# Patient Record
Sex: Female | Born: 1935 | Race: White | Hispanic: No | Smoking: Never smoker
Health system: Southern US, Community
[De-identification: ages and names within clinical notes are randomized; demographics above are authoritative.]

## PROBLEM LIST (undated history)

## (undated) DIAGNOSIS — F0390 Unspecified dementia without behavioral disturbance: Secondary | ICD-10-CM

## (undated) DIAGNOSIS — Z947 Corneal transplant status: Secondary | ICD-10-CM

## (undated) DIAGNOSIS — I10 Essential (primary) hypertension: Secondary | ICD-10-CM

## (undated) HISTORY — PX: ABDOMINAL HYSTERECTOMY: SHX81

## (undated) HISTORY — PX: BACK SURGERY: SHX140

## (undated) HISTORY — PX: APPENDECTOMY: SHX54

## (undated) HISTORY — PX: HIP FRACTURE SURGERY: SHX118

---

## 2009-02-18 ENCOUNTER — Ambulatory Visit (HOSPITAL_COMMUNITY): Admission: RE | Admit: 2009-02-18 | Discharge: 2009-02-18 | Payer: Self-pay | Admitting: Ophthalmology

## 2010-07-14 LAB — COMPREHENSIVE METABOLIC PANEL
ALT: 11 U/L (ref 0–35)
AST: 13 U/L (ref 0–37)
Albumin: 2.9 g/dL — ABNORMAL LOW (ref 3.5–5.2)
Alkaline Phosphatase: 61 U/L (ref 39–117)
Chloride: 110 mEq/L (ref 96–112)
Potassium: 4 mEq/L (ref 3.5–5.1)
Sodium: 144 mEq/L (ref 135–145)
Total Bilirubin: 0.6 mg/dL (ref 0.3–1.2)
Total Protein: 6.4 g/dL (ref 6.0–8.3)

## 2010-07-14 LAB — CBC
HCT: 30 % — ABNORMAL LOW (ref 36.0–46.0)
Platelets: 341 10*3/uL (ref 150–400)
RDW: 13.1 % (ref 11.5–15.5)
WBC: 6.6 10*3/uL (ref 4.0–10.5)

## 2010-07-14 LAB — URINE MICROSCOPIC-ADD ON

## 2010-07-14 LAB — URINALYSIS, ROUTINE W REFLEX MICROSCOPIC
Bilirubin Urine: NEGATIVE
Glucose, UA: NEGATIVE mg/dL
Hgb urine dipstick: NEGATIVE
Ketones, ur: NEGATIVE mg/dL
Protein, ur: NEGATIVE mg/dL
Urobilinogen, UA: 0.2 mg/dL (ref 0.0–1.0)

## 2010-12-20 ENCOUNTER — Ambulatory Visit (INDEPENDENT_AMBULATORY_CARE_PROVIDER_SITE_OTHER): Payer: Self-pay | Admitting: Ophthalmology

## 2010-12-27 ENCOUNTER — Ambulatory Visit (INDEPENDENT_AMBULATORY_CARE_PROVIDER_SITE_OTHER): Payer: Medicare Other | Admitting: Ophthalmology

## 2010-12-27 DIAGNOSIS — H35039 Hypertensive retinopathy, unspecified eye: Secondary | ICD-10-CM

## 2010-12-27 DIAGNOSIS — H43819 Vitreous degeneration, unspecified eye: Secondary | ICD-10-CM

## 2010-12-27 DIAGNOSIS — H3530 Unspecified macular degeneration: Secondary | ICD-10-CM

## 2011-01-14 ENCOUNTER — Inpatient Hospital Stay (HOSPITAL_COMMUNITY): Payer: Medicare Other

## 2011-01-14 ENCOUNTER — Inpatient Hospital Stay (HOSPITAL_COMMUNITY)
Admission: EM | Admit: 2011-01-14 | Discharge: 2011-01-15 | DRG: 065 | Disposition: A | Discharge status: Home / Self Care | Payer: Medicare Other | Attending: Family Medicine | Admitting: Family Medicine

## 2011-01-14 ENCOUNTER — Emergency Department (HOSPITAL_COMMUNITY): Payer: Medicare Other

## 2011-01-14 DIAGNOSIS — I10 Essential (primary) hypertension: Secondary | ICD-10-CM | POA: Diagnosis present

## 2011-01-14 DIAGNOSIS — I369 Nonrheumatic tricuspid valve disorder, unspecified: Secondary | ICD-10-CM

## 2011-01-14 DIAGNOSIS — E538 Deficiency of other specified B group vitamins: Secondary | ICD-10-CM | POA: Diagnosis present

## 2011-01-14 DIAGNOSIS — E785 Hyperlipidemia, unspecified: Secondary | ICD-10-CM | POA: Diagnosis present

## 2011-01-14 DIAGNOSIS — Z7982 Long term (current) use of aspirin: Secondary | ICD-10-CM

## 2011-01-14 DIAGNOSIS — Z886 Allergy status to analgesic agent status: Secondary | ICD-10-CM

## 2011-01-14 DIAGNOSIS — R51 Headache: Secondary | ICD-10-CM | POA: Diagnosis present

## 2011-01-14 DIAGNOSIS — Z880 Allergy status to penicillin: Secondary | ICD-10-CM

## 2011-01-14 DIAGNOSIS — I6350 Cerebral infarction due to unspecified occlusion or stenosis of unspecified cerebral artery: Principal | ICD-10-CM | POA: Diagnosis present

## 2011-01-14 DIAGNOSIS — I4891 Unspecified atrial fibrillation: Secondary | ICD-10-CM | POA: Diagnosis present

## 2011-01-14 DIAGNOSIS — F411 Generalized anxiety disorder: Secondary | ICD-10-CM | POA: Diagnosis present

## 2011-01-14 DIAGNOSIS — G459 Transient cerebral ischemic attack, unspecified: Secondary | ICD-10-CM | POA: Diagnosis present

## 2011-01-14 DIAGNOSIS — Z882 Allergy status to sulfonamides status: Secondary | ICD-10-CM

## 2011-01-14 LAB — COMPREHENSIVE METABOLIC PANEL
ALT: 9 U/L (ref 0–35)
AST: 11 U/L (ref 0–37)
Albumin: 2.9 g/dL — ABNORMAL LOW (ref 3.5–5.2)
Alkaline Phosphatase: 62 U/L (ref 39–117)
BUN: 16 mg/dL (ref 6–23)
CO2: 27 mEq/L (ref 19–32)
Calcium: 9 mg/dL (ref 8.4–10.5)
Chloride: 105 mEq/L (ref 96–112)
Creatinine, Ser: 1.01 mg/dL (ref 0.50–1.10)
GFR calc Af Amer: 55 mL/min — ABNORMAL LOW (ref >90)
GFR calc non Af Amer: 48 mL/min — ABNORMAL LOW (ref >90)
Glucose, Bld: 91 mg/dL (ref 70–99)
Potassium: 4.4 mEq/L (ref 3.5–5.1)
Sodium: 139 mEq/L (ref 135–145)
Total Bilirubin: 0.3 mg/dL (ref 0.3–1.2)
Total Protein: 6.1 g/dL (ref 6.0–8.3)

## 2011-01-14 LAB — LIPID PANEL
Cholesterol: 228 mg/dL — ABNORMAL HIGH (ref 0–200)
HDL: 35 mg/dL — ABNORMAL LOW
LDL Cholesterol: 138 mg/dL — ABNORMAL HIGH (ref 0–99)
Total CHOL/HDL Ratio: 6.5 ratio
Triglycerides: 277 mg/dL — ABNORMAL HIGH
VLDL: 55 mg/dL — ABNORMAL HIGH (ref 0–40)

## 2011-01-14 LAB — COMPREHENSIVE METABOLIC PANEL WITH GFR
ALT: 12 U/L (ref 0–35)
AST: 13 U/L (ref 0–37)
Albumin: 3.4 g/dL — ABNORMAL LOW (ref 3.5–5.2)
Alkaline Phosphatase: 78 U/L (ref 39–117)
BUN: 18 mg/dL (ref 6–23)
CO2: 25 meq/L (ref 19–32)
Calcium: 9.3 mg/dL (ref 8.4–10.5)
Chloride: 105 meq/L (ref 96–112)
Creatinine, Ser: 1.02 mg/dL (ref 0.50–1.10)
GFR calc Af Amer: 55 mL/min — ABNORMAL LOW
GFR calc non Af Amer: 47 mL/min — ABNORMAL LOW
Glucose, Bld: 108 mg/dL — ABNORMAL HIGH (ref 70–99)
Potassium: 4.3 meq/L (ref 3.5–5.1)
Sodium: 136 meq/L (ref 135–145)
Total Bilirubin: 0.3 mg/dL (ref 0.3–1.2)
Total Protein: 6.6 g/dL (ref 6.0–8.3)

## 2011-01-14 LAB — CBC
HCT: 34.1 % — ABNORMAL LOW (ref 36.0–46.0)
Hemoglobin: 11.5 g/dL — ABNORMAL LOW (ref 12.0–15.0)
Hemoglobin: 11.1 g/dL — ABNORMAL LOW (ref 12.0–15.0)
MCH: 28.8 pg (ref 26.0–34.0)
MCHC: 32.6 g/dL (ref 30.0–36.0)
MCV: 88.9 fL (ref 78.0–100.0)
MCV: 88.3 fL (ref 78.0–100.0)
Platelets: 189 10*3/uL (ref 150–400)
Platelets: 208 K/uL (ref 150–400)
RBC: 3.98 MIL/uL (ref 3.87–5.11)
RBC: 3.86 MIL/uL — ABNORMAL LOW (ref 3.87–5.11)
RDW: 13 % (ref 11.5–15.5)
WBC: 8.1 10*3/uL (ref 4.0–10.5)
WBC: 6.5 K/uL (ref 4.0–10.5)

## 2011-01-14 LAB — PROTIME-INR
INR: 1 (ref 0.00–1.49)
INR: 1 (ref 0.00–1.49)
Prothrombin Time: 13.4 s (ref 11.6–15.2)
Prothrombin Time: 13.4 s (ref 11.6–15.2)

## 2011-01-14 LAB — URINALYSIS, ROUTINE W REFLEX MICROSCOPIC
Nitrite: NEGATIVE
Protein, ur: NEGATIVE mg/dL
Urobilinogen, UA: 0.2 mg/dL (ref 0.0–1.0)

## 2011-01-14 LAB — CARDIAC PANEL(CRET KIN+CKTOT+MB+TROPI)
CK, MB: 2.2 ng/mL (ref 0.3–4.0)
Relative Index: INVALID (ref 0.0–2.5)
Total CK: 28 U/L (ref 7–177)
Troponin I: <0.3 ng/mL

## 2011-01-14 LAB — HEMOGLOBIN A1C
Hgb A1c MFr Bld: 6 % — ABNORMAL HIGH (ref <5.7)
Mean Plasma Glucose: 126 mg/dL — ABNORMAL HIGH (ref <117)

## 2011-01-14 LAB — TROPONIN I: Troponin I: <0.3 ng/mL

## 2011-01-14 LAB — CK TOTAL AND CKMB (NOT AT ARMC)
CK, MB: 2.3 ng/mL (ref 0.3–4.0)
Relative Index: INVALID (ref 0.0–2.5)
Total CK: 38 U/L (ref 7–177)

## 2011-01-14 LAB — URINE MICROSCOPIC-ADD ON

## 2011-01-14 LAB — DIFFERENTIAL
Eosinophils Absolute: 0.3 10*3/uL (ref 0.0–0.7)
Lymphs Abs: 2.8 10*3/uL (ref 0.7–4.0)
Neutro Abs: 4.3 10*3/uL (ref 1.7–7.7)
Neutrophils Relative %: 53 % (ref 43–77)

## 2011-01-14 LAB — GLUCOSE, CAPILLARY: Glucose-Capillary: 112 mg/dL — ABNORMAL HIGH (ref 70–99)

## 2011-01-14 LAB — APTT: aPTT: 28 s (ref 24–37)

## 2011-01-14 MED ORDER — GADOBENATE DIMEGLUMINE 529 MG/ML IV SOLN
15.0000 mL | Freq: Once | INTRAVENOUS | Status: AC | PRN
  Administered 2011-01-14: 15 mL via INTRAVENOUS

## 2011-01-15 LAB — CBC
Hemoglobin: 10.6 g/dL — ABNORMAL LOW (ref 12.0–15.0)
MCH: 29 pg (ref 26.0–34.0)
Platelets: 197 10*3/uL (ref 150–400)
RBC: 3.65 MIL/uL — ABNORMAL LOW (ref 3.87–5.11)
WBC: 6.7 10*3/uL (ref 4.0–10.5)

## 2011-01-15 LAB — COMPREHENSIVE METABOLIC PANEL
ALT: 9 U/L (ref 0–35)
AST: 12 U/L (ref 0–37)
Alkaline Phosphatase: 60 U/L (ref 39–117)
CO2: 24 mEq/L (ref 19–32)
Calcium: 8.6 mg/dL (ref 8.4–10.5)
GFR calc Af Amer: 62 mL/min — ABNORMAL LOW (ref >90)
GFR calc non Af Amer: 54 mL/min — ABNORMAL LOW (ref >90)
Glucose, Bld: 91 mg/dL (ref 70–99)
Potassium: 4.4 mEq/L (ref 3.5–5.1)
Sodium: 138 mEq/L (ref 135–145)

## 2011-01-18 NOTE — Consult Note (Signed)
NAMEAIMAN, SONN             ACCOUNT NO.:  [id number]  MEDICAL RECORD NO.:  [id number]  LOCATION:  3706                         FACILITY:  [facility]  PHYSICIAN:  Marlan Palau, M.D.  DATE OF BIRTH:  03-12-1919  DATE OF CONSULTATION:  01/14/2011 DATE OF DISCHARGE:                                CONSULTATION   REASON FOR CONSULTATION:  Transient headache, difficulty speaking, right- sided weakness, which is now resolved.  On CT scan, the patient shows hypodensity in the left frontal lobe.  HISTORY OF PRESENT ILLNESS:  This is an 77 year old female with past medical history of TIA, anxiety, migraines, and hypertension.  The patient does see Dr. Maple Hudson of [facility] for her migraine headaches and recently was given a shot of lidocaine in the posterior occipital nerve for her headaches approximately 5 months ago.  The patient apparently went to sleep last night at her normal baseline; however, was awakened by a sudden severe headache at approximately 2:30 in the morning.  She states this was unlike her normal migraine headache that she did not have a scotoma and the intensity was much more severe. She also states that she had generalized weakness at this time and question of possible difficulty speech.  Due to these symptoms, the patient was brought to [facility] where she was brought to the ED.  The patient was initially supposed to be brought to [facility] by ambulance, made a mistake and brought her to [facility].  Majority of her records are at [facility].  The patient's CT scan of her brain did show a hypodensity in the left anterior frontal region suggestive of a focal infarct.  No evidence of acute intracranial hemorrhage or mass lesion.  As stated, there is no CT to compare.  At the present time, the patient still has a mild headache, but all symptoms were resolved.  PAST MEDICAL HISTORY:  As mentioned above.  MEDICATIONS AT HOME:  The patient  is on B12, folic acid, aspirin 81 mg daily, amlodipine, alprazolam, metoprolol, and lisinopril.  While she has been in the hospital, she has been placed on the same medications; however, Crestor has been added secondary to hyperlipidemia.  ALLERGIES: 1. PENICILLIN. 2. CODEINE. 3. SULFA. 4. MORPHINE. 5. MACROBID.  SOCIAL HISTORY:  The patient does not smoke, drink or do illicit drugs. She lives in [place] with her daughter.  REVIEW OF SYSTEMS:  Negative with the exception of headache.  PHYSICAL EXAMINATION:  VITAL SIGNS:  Blood pressure is 105/43, pulse 57, respiration 18, temperature 98.0. NEUROLOGIC:  She is alert and oriented x3, carries out 2-3 steps commands.  Pupils are equal, round, reactive to light and accommodating with exception of her left pupil, which does show some irregularity secondary to AN iridectomy.  She has conjugate extraocular gaze. Extraocular movements are intact.  Visual fields are grossly intact. Face is symmetrical.  Tongue is in the midline.  The patient shows no dysarthria or aphasia.  Facial sensation is full.  Shoulder shrug and head turn are full.  Coordination; finger-to-nose and heel-to-shin are smooth.  Motor is 5/5 throughout.  Deep tendon reflexes are 2+ throughout downgoing toes bilaterally.  The  patient shows no drift in the upper or lower extremities. PULMONARY:  Clear. CARDIOVASCULAR:  S1-S2 is audible.  No murmurs. NECK:  Negative for bruits.  Sensation is full to pinprick and light touch throughout.  LABORATORY DATA:  Sodium is 139, potassium 4.4, chloride 105, CO2 27, BUN 16, creatinine 1.01, glucose 91.  White blood cell count 6.5, hemoglobin is 11.1, hematocrit 34.1, platelets 208.  Triglycerides 277, cholesterol 228, HDL 35, LDL 138.  IMAGING:  CT of head shows a left frontal hypodensity.  A 2-D echo shows EF of 60-65% with no PFO and no thrombus.  Carotid Dopplers are pending.  ASSESSMENT:  This is an 77 year old Caucasian  female with history of transient ischemic attack and migraine headaches who has now been hospitalized today for wakening with a severe headache and she states generalized headaches; however, per notes prior, there is a component of possible right-sided weakness and difficulty with speech.  All of her symptoms about the headaches have now cleared.  CT of head shows a hypodensity in the left frontal lobe.  MRI and MRA of brain is pending. A 2-D echo is normal.  Carotid Dopplers pending.  RECOMMENDATIONS:  At this time, I agree with stroke workup including MRI and MRA of brain, carotid Dopplers, agree with continuing with aspirin at this time and starting Crestor for hyperlipidemia.  The patient would recommend heart-healthy diet when the patient is able to take p.o.'s.     Felicie Morn, PA-C   ______________________________ C. Lesia Sago, M.D.    DS/MEDQ  D:  01/14/2011  T:  01/14/2011  Job:  [id number]  Electronically Signed by Felicie Morn PA-C on 01/17/2011 03:33:19 PM Electronically Signed by Thana Farr M.D. on 01/18/2011 06:23:03 PM

## 2011-01-19 NOTE — Discharge Summary (Signed)
Lisa Freeman, Lisa Freeman             ACCOUNT NO.:  [id number]  MEDICAL RECORD NO.:  [id number]  LOCATION:  3706                         FACILITY:  [facility]  PHYSICIAN:  Tarry Kos, MD       DATE OF BIRTH:  03/08/1919  DATE OF ADMISSION:  01/14/2011 DATE OF DISCHARGE:  01/15/2011                              DISCHARGE SUMMARY   DISCHARGE DIAGNOSES: 1. Cerebrovascular accident/transient ischemic attack with total     resolution of symptoms with no residual effects. 2. Paroxysmal atrial fibrillation, now normal sinus rhythm, will need     further cardiac monitoring as an outpatient for 2-week period with     [facility], will need to be set up by primary care physician. 3. Hypertension, stable. 4. Hyperlipidemia, newly started on statin.  SUMMARY OF HOSPITAL COURSE:  Lisa Freeman is a very pleasant 77 year old highly functioning female, who came in with slurred speech and some questionable right-sided weakness.  She was admitted for concerns of an acute stroke.  MRI/MRA of her brain only showed a chronic small medium- sized vessel ischemia with a remote anterior division left MCA infarct. Her MRI showed a right ICA subsequent stenosis of 50-60% with no left ICA stenosis.  It was recommended by Neurology, who was consulted to place the patient on full-dose aspirin.  Her symptoms totally resolved while she was here.  She passed her swallow evaluation and did not need any home physical therapy at discharge.  She was found to be in paroxysmal AFib.  Neurology did recommend for her primary care physician to obtain cardiac monitoring for approximately 2-week period as an outpatient in order to pickup if she is going in and out of AFib.  She was in AFib that was rate controlled very briefly here but then converted on her own to normal sinus rhythm.  She has been started on a statin here for her high cholesterol levels.  She is going to be discharged home to follow up with primary care  physician in approximately 1 week, again with further recommendations for cardiac monitoring as an outpatient.  PHYSICAL EXAMINATION:  VITAL SIGNS:  She has been afebrile.  Vital signs have been stable. GENERAL:  Alert and oriented x4.  No apparent distress, cooperative and friendly, very highly functioning. HEENT:  Extraocular muscles intact.  Pupils equal, reactive to light. Oropharynx clear.  Mucous membranes moist. NECK:  No JVD.  No carotid bruits. CARDIAC:  Regular rate and rhythm without murmurs, rubs, or gallops. CHEST:  Clear to auscultation bilaterally.  No wheeze, rhonchi, or rales. ABDOMEN:  Soft, nontender, nondistended.  Positive bowel sounds.  No hepatosplenomegaly. EXTREMITIES:  No clubbing, cyanosis, or edema. PSYCHIATRIC:  Normal mood and affect. NEUROLOGIC:  No focal neurologic deficits.  Cranial nerves II through XII grossly intact. SKIN:  No rashes.  She also had a 2-D echo, which showed normal EF of 60-65% with moderate biatrial enlargement, otherwise normal 2-D echo.  She has been placed on full-dose aspirin, previously was taking baby aspirin.  She has also been placed on Crestor.  Again, at followup, Holter monitoring/[facility] monitoring needs to be arranged.  As she continues to go in and out of  AFib, she would likely very highly be a Coumadin candidate to help prevent her further stroke risk.          ______________________________ Tarry Kos, MD     RD/MEDQ  D:  01/15/2011  T:  01/15/2011  Job:  [id number]  Electronically Signed by Tarry Kos MD on 01/19/2011 07:02:20 PM

## 2011-01-22 NOTE — H&P (Signed)
Lisa Freeman, Lisa Freeman             ACCOUNT NO.:  [id number]  MEDICAL RECORD NO.:  [id number]  LOCATION:  [facility]                         FACILITY:  [facility]  PHYSICIAN:  Lisa Freeman, M.D.      DATE OF BIRTH:  06/08/1918  DATE OF ADMISSION:  01/14/2011 DATE OF DISCHARGE:                             HISTORY & PHYSICAL   PRIMARY CARE PHYSICIAN:  The patient is currently unassigned.  PRESENTING COMPLAINT:  Difficulty speaking and headache.  HISTORY OF PRESENT ILLNESS:  The patient is an 77 year old female who came in secondary to sudden onset of headache and inability to talk or swallow.  She went to bed around 9 o'clock doing fine and woke up suddenly around 2:30 with onset of difficulty swallowing, headache, and difficulty with speech.  She has had history of TIA about 5 years ago that was worked up.  The patient also complaining of some right-sided weakness which is now improving, almost back to within normal.  She denied any fever, no nausea, vomiting, or diarrhea.  She had some mild dizziness with the onset.  She came in about outside the window for possible t-PA.  Hence, she is being admitted to medical service.  At this point, she is improving, but worried.  PAST MEDICAL HISTORY:  Significant for TIA over 5 years ago.  Also, hypertension and anxiety disorders.  ALLERGIES:  No known drug allergies.  MEDICATIONS:  Alprazolam, folic acid, lisinopril, and metoprolol.  SOCIAL HISTORY:  She lives in [place] with her daughter who is here with her.  No tobacco, alcohol, or IV drug use.  FAMILY HISTORY:  No significant family history for strokes or cardiovascular disease.  REVIEW OF SYSTEMS:  Just anxious, otherwise all systems reviewed are negative except per HPI.  PHYSICAL EXAMINATION:  VITAL SIGNS:  She is afebrile, Freeman pressure is currently 162/50, pulse 76, respiratory rate of 12, and saturations 100% on room air. GENERAL:  She is awake, alert, and oriented in no acute  distress. HEENT:  PERRL.  EOMI.  No significant pallor.  No jaundice.  No rhinorrhea. NECK:  Supple.  No JVD.  No lymphadenopathy. RESPIRATORY:  She has good air entry bilaterally.  No wheezes.  No rales.  No crackles. CARDIOVASCULAR:  She has S1 and S2.  No murmur. ABDOMEN:  Soft, full, nontender with positive bowel sounds. EXTREMITIES:  No edema, cyanosis, or clubbing. SKIN:  No ulcer. NEUROLOGIC:  Cranial nerves II through XII seem to be intact.  Her power is 5/5 in upper and lower extremities respectively.  She has slight clumsiness on the left, but no frank focal weakness.  LABORATORY DATA:  White count is 8.1, hemoglobin 11.5, and platelet count of 189.  Glucose 112.  PT 13.4 and INR 1.0.  Her chemistry is currently pending.  Head CT without contrast showed low-attenuation change in the left anterior frontal region suggesting focal infarct.  No evidence of acute intracranial hemorrhage or mass lesion, chronic atrophy and small-vessel ischemic change.  ASSESSMENT:  This is an 77 year old female presenting with what appearsto be an acute stroke.  The patient is here outside the window for t-PA. She seems to be gaining her strength some more.  PLAN: 1.  Acute CVA.  Admit the patient to neuro floor.  Neurology consult.     Get MRI/MRA of the brain, fasting lipid panel, and full stroke     workup.  Depending on the patient's response to these measures, we     will consider home versus placement.  At this point, however, we     will proceed with PT, OT among other things. 2. Hypertension.  We will keep the patient permissive hypertensive for     now.  We will resume her Freeman pressure medicines which include the     lisinopril and the metoprolol. 3. Anxiety disorder.  Continue with anti-anxiety medications as much     as possible especially in the setting of her new stroke.  Further     treatment will depend on the patient's response to these initial     measures.     Lisa Freeman, M.D.     Verlin Grills  D:  01/14/2011  T:  01/14/2011  Job:  [id number]  Electronically Signed by Lisa Freeman M.D. on 01/22/2011 03:24:37 PM

## 2011-03-28 ENCOUNTER — Encounter (INDEPENDENT_AMBULATORY_CARE_PROVIDER_SITE_OTHER): Payer: PRIVATE HEALTH INSURANCE | Admitting: Ophthalmology

## 2011-03-29 ENCOUNTER — Ambulatory Visit (INDEPENDENT_AMBULATORY_CARE_PROVIDER_SITE_OTHER): Payer: Medicare Other | Admitting: Ophthalmology

## 2011-03-29 DIAGNOSIS — H43819 Vitreous degeneration, unspecified eye: Secondary | ICD-10-CM

## 2011-03-29 DIAGNOSIS — H35359 Cystoid macular degeneration, unspecified eye: Secondary | ICD-10-CM

## 2011-03-29 DIAGNOSIS — H35039 Hypertensive retinopathy, unspecified eye: Secondary | ICD-10-CM

## 2011-03-29 DIAGNOSIS — I10 Essential (primary) hypertension: Secondary | ICD-10-CM

## 2011-03-29 DIAGNOSIS — H3530 Unspecified macular degeneration: Secondary | ICD-10-CM

## 2011-03-29 DIAGNOSIS — H27129 Anterior dislocation of lens, unspecified eye: Secondary | ICD-10-CM

## 2011-06-27 ENCOUNTER — Encounter (INDEPENDENT_AMBULATORY_CARE_PROVIDER_SITE_OTHER): Payer: PRIVATE HEALTH INSURANCE | Admitting: Ophthalmology

## 2012-10-25 ENCOUNTER — Encounter (HOSPITAL_COMMUNITY)
Admission: EM | Disposition: A | Discharge status: Home / Self Care | Payer: Self-pay | Source: Home / Self Care | Attending: Family Medicine

## 2012-10-25 ENCOUNTER — Inpatient Hospital Stay: Admit: 2012-10-25 | Payer: Self-pay | Admitting: Urology

## 2012-10-25 ENCOUNTER — Encounter (HOSPITAL_COMMUNITY): Payer: Self-pay | Admitting: Emergency Medicine

## 2012-10-25 ENCOUNTER — Encounter (HOSPITAL_COMMUNITY): Payer: Self-pay | Admitting: *Deleted

## 2012-10-25 ENCOUNTER — Inpatient Hospital Stay (HOSPITAL_COMMUNITY)
Admission: EM | Admit: 2012-10-25 | Discharge: 2012-10-28 | DRG: 694 | Disposition: A | Discharge status: Home / Self Care | Payer: Medicare Other | Attending: Family Medicine | Admitting: Family Medicine

## 2012-10-25 ENCOUNTER — Observation Stay (HOSPITAL_COMMUNITY): Payer: Medicare Other | Admitting: *Deleted

## 2012-10-25 DIAGNOSIS — N139 Obstructive and reflux uropathy, unspecified: Secondary | ICD-10-CM | POA: Diagnosis present

## 2012-10-25 DIAGNOSIS — N390 Urinary tract infection, site not specified: Secondary | ICD-10-CM | POA: Diagnosis present

## 2012-10-25 DIAGNOSIS — N201 Calculus of ureter: Principal | ICD-10-CM | POA: Diagnosis present

## 2012-10-25 DIAGNOSIS — A498 Other bacterial infections of unspecified site: Secondary | ICD-10-CM | POA: Diagnosis present

## 2012-10-25 DIAGNOSIS — D72829 Elevated white blood cell count, unspecified: Secondary | ICD-10-CM

## 2012-10-25 DIAGNOSIS — R112 Nausea with vomiting, unspecified: Secondary | ICD-10-CM | POA: Diagnosis present

## 2012-10-25 DIAGNOSIS — Z66 Do not resuscitate: Secondary | ICD-10-CM | POA: Diagnosis present

## 2012-10-25 DIAGNOSIS — N200 Calculus of kidney: Secondary | ICD-10-CM | POA: Diagnosis present

## 2012-10-25 DIAGNOSIS — I10 Essential (primary) hypertension: Secondary | ICD-10-CM | POA: Diagnosis present

## 2012-10-25 DIAGNOSIS — N138 Other obstructive and reflux uropathy: Secondary | ICD-10-CM | POA: Diagnosis present

## 2012-10-25 DIAGNOSIS — R109 Unspecified abdominal pain: Secondary | ICD-10-CM | POA: Diagnosis present

## 2012-10-25 DIAGNOSIS — D649 Anemia, unspecified: Secondary | ICD-10-CM | POA: Diagnosis present

## 2012-10-25 DIAGNOSIS — N1330 Unspecified hydronephrosis: Secondary | ICD-10-CM | POA: Diagnosis present

## 2012-10-25 HISTORY — DX: Essential (primary) hypertension: I10

## 2012-10-25 HISTORY — PX: CYSTOSCOPY WITH RETROGRADE PYELOGRAM, URETEROSCOPY AND STENT PLACEMENT: SHX5789

## 2012-10-25 LAB — CBC WITH DIFFERENTIAL/PLATELET
Basophils Absolute: 0 10*3/uL (ref 0.0–0.1)
Basophils Relative: 0 % (ref 0–1)
Eosinophils Absolute: 0 10*3/uL (ref 0.0–0.7)
HCT: 36.5 % (ref 36.0–46.0)
Hemoglobin: 11.9 g/dL — ABNORMAL LOW (ref 12.0–15.0)
MCH: 29.4 pg (ref 26.0–34.0)
MCHC: 32.6 g/dL (ref 30.0–36.0)
Monocytes Absolute: 1.1 10*3/uL — ABNORMAL HIGH (ref 0.1–1.0)
Monocytes Relative: 6 % (ref 3–12)
Neutro Abs: 15.7 10*3/uL — ABNORMAL HIGH (ref 1.7–7.7)
Neutrophils Relative %: 88 % — ABNORMAL HIGH (ref 43–77)
RDW: 12.3 % (ref 11.5–15.5)

## 2012-10-25 LAB — POCT I-STAT, CHEM 8
BUN: 16 mg/dL (ref 6–23)
Calcium, Ion: 1.14 mmol/L (ref 1.13–1.30)
Chloride: 102 mEq/L (ref 96–112)
Creatinine, Ser: 1.3 mg/dL — ABNORMAL HIGH (ref 0.50–1.10)
Glucose, Bld: 145 mg/dL — ABNORMAL HIGH (ref 70–99)
HCT: 38 % (ref 36.0–46.0)
Hemoglobin: 12.9 g/dL (ref 12.0–15.0)
Potassium: 4.3 mEq/L (ref 3.5–5.1)
Sodium: 134 mEq/L — ABNORMAL LOW (ref 135–145)
TCO2: 21 mmol/L (ref 0–100)

## 2012-10-25 LAB — URINE MICROSCOPIC-ADD ON

## 2012-10-25 LAB — URINALYSIS, ROUTINE W REFLEX MICROSCOPIC
Bilirubin Urine: NEGATIVE
Hgb urine dipstick: NEGATIVE
Nitrite: NEGATIVE
Protein, ur: NEGATIVE mg/dL
Urobilinogen, UA: 0.2 mg/dL (ref 0.0–1.0)

## 2012-10-25 SURGERY — CYSTOURETEROSCOPY, WITH RETROGRADE PYELOGRAM AND STENT INSERTION
Anesthesia: Monitor Anesthesia Care | Site: Ureter | Laterality: Left | Wound class: Clean Contaminated

## 2012-10-25 MED ORDER — SODIUM CHLORIDE 0.9 % IV SOLN
INTRAVENOUS | Status: DC
  Administered 2012-10-26 – 2012-10-27 (×2): via INTRAVENOUS

## 2012-10-25 MED ORDER — FENTANYL CITRATE 0.05 MG/ML IJ SOLN: 25.0000 ug | INTRAMUSCULAR | Status: DC | PRN

## 2012-10-25 MED ORDER — LIDOCAINE HCL 2 % EX GEL
CUTANEOUS | Status: DC | PRN
  Administered 2012-10-25: 1 via URETHRAL

## 2012-10-25 MED ORDER — ONDANSETRON HCL 4 MG PO TABS
4.0000 mg | ORAL_TABLET | Freq: Four times a day (QID) | ORAL | Status: DC | PRN
  Administered 2012-10-26: 4 mg via ORAL
  Filled 2012-10-25: qty 1

## 2012-10-25 MED ORDER — LIDOCAINE HCL 2 % EX GEL
CUTANEOUS | Status: AC
  Filled 2012-10-25: qty 10

## 2012-10-25 MED ORDER — CIPROFLOXACIN IN D5W 200 MG/100ML IV SOLN
200.0000 mg | Freq: Two times a day (BID) | INTRAVENOUS | Status: DC
  Administered 2012-10-26 – 2012-10-28 (×5): 200 mg via INTRAVENOUS
  Filled 2012-10-25 (×6): qty 100

## 2012-10-25 MED ORDER — IOHEXOL 300 MG/ML  SOLN
INTRAMUSCULAR | Status: DC | PRN
  Administered 2012-10-25: 7 mL

## 2012-10-25 MED ORDER — FOLIC ACID 1 MG PO TABS
1.0000 mg | ORAL_TABLET | Freq: Every day | ORAL | Status: DC
  Administered 2012-10-26 – 2012-10-28 (×3): 1 mg via ORAL
  Filled 2012-10-25 (×3): qty 1

## 2012-10-25 MED ORDER — LISINOPRIL 10 MG PO TABS
10.0000 mg | ORAL_TABLET | Freq: Every day | ORAL | Status: DC
  Administered 2012-10-27 – 2012-10-28 (×2): 10 mg via ORAL
  Filled 2012-10-25 (×3): qty 1

## 2012-10-25 MED ORDER — PROMETHAZINE HCL 25 MG/ML IJ SOLN: 6.2500 mg | INTRAMUSCULAR | Status: DC | PRN

## 2012-10-25 MED ORDER — HYDROCODONE-ACETAMINOPHEN 5-325 MG PO TABS
1.0000 | ORAL_TABLET | Freq: Once | ORAL | Status: AC
  Administered 2012-10-25: 1 via ORAL
  Filled 2012-10-25: qty 1

## 2012-10-25 MED ORDER — IOHEXOL 300 MG/ML  SOLN
INTRAMUSCULAR | Status: AC
  Filled 2012-10-25: qty 1

## 2012-10-25 MED ORDER — 0.9 % SODIUM CHLORIDE (POUR BTL) OPTIME
TOPICAL | Status: DC | PRN
  Administered 2012-10-25: 1000 mL

## 2012-10-25 MED ORDER — SULFAMETHOXAZOLE-TRIMETHOPRIM 800-160 MG PO TABS: 1.0000 | ORAL_TABLET | Freq: Two times a day (BID) | ORAL | Status: DC

## 2012-10-25 MED ORDER — SODIUM CHLORIDE 0.9 % IV SOLN
INTRAVENOUS | Status: DC | PRN
  Administered 2012-10-25: 22:00:00 via INTRAVENOUS

## 2012-10-25 MED ORDER — ONDANSETRON HCL 4 MG/2ML IJ SOLN
4.0000 mg | Freq: Once | INTRAMUSCULAR | Status: AC
  Administered 2012-10-25: 4 mg via INTRAVENOUS
  Filled 2012-10-25: qty 2

## 2012-10-25 MED ORDER — STERILE WATER FOR IRRIGATION IR SOLN
Status: DC | PRN
  Administered 2012-10-25: 3000 mL

## 2012-10-25 MED ORDER — KETAMINE HCL 10 MG/ML IJ SOLN
INTRAMUSCULAR | Status: DC | PRN
  Administered 2012-10-25: 20 mg via INTRAVENOUS

## 2012-10-25 MED ORDER — ACETAMINOPHEN 325 MG PO TABS
650.0000 mg | ORAL_TABLET | Freq: Four times a day (QID) | ORAL | Status: DC | PRN
  Administered 2012-10-26 (×2): 650 mg via ORAL
  Filled 2012-10-25 (×2): qty 2

## 2012-10-25 MED ORDER — SODIUM CHLORIDE 0.9 % IJ SOLN
3.0000 mL | Freq: Two times a day (BID) | INTRAMUSCULAR | Status: DC
  Administered 2012-10-26: 3 mL via INTRAVENOUS

## 2012-10-25 MED ORDER — ONDANSETRON HCL 4 MG/2ML IJ SOLN: 4.0000 mg | Freq: Four times a day (QID) | INTRAMUSCULAR | Status: DC | PRN

## 2012-10-25 MED ORDER — PHENYLEPHRINE HCL 10 MG/ML IJ SOLN
INTRAMUSCULAR | Status: DC | PRN
  Administered 2012-10-25: 40 ug via INTRAVENOUS

## 2012-10-25 MED ORDER — MORPHINE SULFATE 2 MG/ML IJ SOLN
1.0000 mg | INTRAMUSCULAR | Status: DC | PRN
  Administered 2012-10-27: 1 mg via INTRAVENOUS
  Filled 2012-10-25: qty 1

## 2012-10-25 MED ORDER — LACTATED RINGERS IV SOLN: INTRAVENOUS | Status: DC

## 2012-10-25 MED ORDER — CIPROFLOXACIN IN D5W 400 MG/200ML IV SOLN
400.0000 mg | Freq: Once | INTRAVENOUS | Status: AC
  Administered 2012-10-25: 400 mg via INTRAVENOUS
  Filled 2012-10-25: qty 200

## 2012-10-25 MED ORDER — MORPHINE SULFATE 4 MG/ML IJ SOLN
4.0000 mg | Freq: Once | INTRAMUSCULAR | Status: AC
  Administered 2012-10-25: 4 mg via INTRAVENOUS
  Filled 2012-10-25: qty 1

## 2012-10-25 MED ORDER — HYDROCODONE-ACETAMINOPHEN 5-325 MG PO TABS: 1.0000 | ORAL_TABLET | ORAL | Status: DC | PRN

## 2012-10-25 MED ORDER — ALPRAZOLAM 0.25 MG PO TABS
0.2500 mg | ORAL_TABLET | Freq: Every day | ORAL | Status: DC
  Administered 2012-10-26 – 2012-10-27 (×3): 0.25 mg via ORAL
  Filled 2012-10-25 (×3): qty 1

## 2012-10-25 MED ORDER — METOPROLOL TARTRATE 12.5 MG HALF TABLET
12.5000 mg | ORAL_TABLET | Freq: Two times a day (BID) | ORAL | Status: DC
  Administered 2012-10-26 – 2012-10-28 (×5): 12.5 mg via ORAL
  Filled 2012-10-25 (×8): qty 1

## 2012-10-25 MED ORDER — ACETAMINOPHEN 650 MG RE SUPP: 650.0000 mg | Freq: Four times a day (QID) | RECTAL | Status: DC | PRN

## 2012-10-25 MED ORDER — ONDANSETRON HCL 4 MG/2ML IJ SOLN
4.0000 mg | Freq: Once | INTRAMUSCULAR | Status: AC
Start: 2012-10-25 — End: 2012-10-25
  Administered 2012-10-25: 4 mg via INTRAVENOUS
  Filled 2012-10-25: qty 2

## 2012-10-25 MED ORDER — PROPOFOL INFUSION 10 MG/ML OPTIME
INTRAVENOUS | Status: DC | PRN
  Administered 2012-10-25: 140 ug/kg/min via INTRAVENOUS

## 2012-10-25 MED ORDER — MORPHINE SULFATE 4 MG/ML IJ SOLN: 4.0000 mg | Freq: Once | INTRAMUSCULAR | Status: DC

## 2012-10-25 MED ORDER — AMLODIPINE BESYLATE 5 MG PO TABS
5.0000 mg | ORAL_TABLET | Freq: Every day | ORAL | Status: DC
  Administered 2012-10-27 – 2012-10-28 (×2): 5 mg via ORAL
  Filled 2012-10-25 (×3): qty 1

## 2012-10-25 SURGICAL SUPPLY — 20 items
ADAPTER CATH URET PLST 4-6FR (CATHETERS) ×2 IMPLANT
BAG URO CATCHER STRL LF (DRAPE) ×2 IMPLANT
CATH INTERMIT  6FR 70CM (CATHETERS) IMPLANT
CATH URET 5FR 28IN CONE TIP (BALLOONS) ×1
CATH URET 5FR 70CM CONE TIP (BALLOONS) ×1 IMPLANT
CLOTH BEACON ORANGE TIMEOUT ST (SAFETY) ×2 IMPLANT
DRAPE CAMERA CLOSED 9X96 (DRAPES) ×2 IMPLANT
GLOVE BIOGEL M 7.0 STRL (GLOVE) ×2 IMPLANT
GLOVE SURG SS PI 8.5 STRL IVOR (GLOVE) ×1
GLOVE SURG SS PI 8.5 STRL STRW (GLOVE) ×1 IMPLANT
GOWN STRL NON-REIN LRG LVL3 (GOWN DISPOSABLE) ×2 IMPLANT
GOWN STRL REIN 2XL XLG LVL4 (GOWN DISPOSABLE) ×2 IMPLANT
GUIDEWIRE STR DUAL SENSOR (WIRE) ×2 IMPLANT
MANIFOLD NEPTUNE II (INSTRUMENTS) ×2 IMPLANT
NS IRRIG 1000ML POUR BTL (IV SOLUTION) ×2 IMPLANT
PACK CYSTO (CUSTOM PROCEDURE TRAY) ×2 IMPLANT
STENT CONTOUR 6FRX24X.038 (STENTS) ×2 IMPLANT
TUBING CONNECTING 10 (TUBING) ×2 IMPLANT
WATER STERILE IRR 3000ML UROMA (IV SOLUTION) ×2 IMPLANT
WIRE COONS/BENSON .038X145CM (WIRE) IMPLANT

## 2012-10-25 NOTE — Preoperative (Signed)
Beta Blockers   Reason not to administer Beta Blockers:Not Applicable 

## 2012-10-25 NOTE — Anesthesia Preprocedure Evaluation (Signed)
Anesthesia Evaluation  Patient identified by MRN, date of birth, ID band Patient awake    Reviewed: Allergy & Precautions, H&P , NPO status , Patient's Chart, lab work & pertinent test results  Airway Mallampati: II TM Distance: >3 FB Neck ROM: Full    Dental  (+) Edentulous Upper and Edentulous Lower   Pulmonary neg pulmonary ROS,  breath sounds clear to auscultation  Pulmonary exam normal       Cardiovascular hypertension, Rhythm:Regular Rate:Normal     Neuro/Psych negative neurological ROS  negative psych ROS   GI/Hepatic negative GI ROS, Neg liver ROS,   Endo/Other  negative endocrine ROS  Renal/GU negative Renal ROS  negative genitourinary   Musculoskeletal negative musculoskeletal ROS (+)   Abdominal   Peds  Hematology negative hematology ROS (+)   Anesthesia Other Findings   Reproductive/Obstetrics                           Anesthesia Physical Anesthesia Plan  ASA: II and emergent  Anesthesia Plan: MAC   Post-op Pain Management:    Induction:   Airway Management Planned: Simple Face Mask  Additional Equipment:   Intra-op Plan:   Post-operative Plan:   Informed Consent: I have reviewed the patients History and Physical, chart, labs and discussed the procedure including the risks, benefits and alternatives for the proposed anesthesia with the patient or authorized representative who has indicated his/her understanding and acceptance.   Dental advisory given  Plan Discussed with: CRNA  Anesthesia Plan Comments:         Anesthesia Quick Evaluation

## 2012-10-25 NOTE — H&P (Signed)
PCP:   CRAFT,PAUL E., PA-C   Chief Complaint:  abd pain  HPI: 79 yo female with one day h/o left flank and llq abd pain.  Feels awful.  No n/v.  No fevers but has spiked a temp in ED.  Was seen by pcp today and had ct done as outpt which showed an obstructed infected left ureteral stone.  Pt is very healthy and lives with her daughter who is present in ED.  Pt pain initially was 20/10 now is 7/10.  She is about to go to the OR with urology for stone removal and possible stent.    Review of Systems:  Positive and negative as per HPI otherwise all other systems are negative  Past Medical History: Past Medical History  Diagnosis Date  . Hypertension    Past Surgical History  Procedure Laterality Date  . Appendectomy    . Back surgery    . Abdominal hysterectomy      Medications: Prior to Admission medications   Medication Sig Start Date End Date Taking? Authorizing Provider  ALPRAZolam (XANAX) 0.25 MG tablet Take 0.25 mg by mouth at bedtime.   Yes Historical Provider, MD  amLODipine (NORVASC) 5 MG tablet Take 5 mg by mouth daily.   Yes Historical Provider, MD  aspirin EC 81 MG tablet Take 81 mg by mouth daily.   Yes Historical Provider, MD  folic acid (FOLVITE) 1 MG tablet Take 1 mg by mouth daily.   Yes Historical Provider, MD  lisinopril (PRINIVIL,ZESTRIL) 10 MG tablet Take 10 mg by mouth daily.   Yes Historical Provider, MD  metoprolol tartrate (LOPRESSOR) 25 MG tablet Take 12.5 mg by mouth 2 (two) times daily. Take 1/2 tablet (12.5mg ) twice daily   Yes Historical Provider, MD  traMADol (ULTRAM) 50 MG tablet Take 50 mg by mouth every 6 (six) hours as needed for pain.   Yes Historical Provider, MD  HYDROcodone-acetaminophen (NORCO/VICODIN) 5-325 MG per tablet Take 1 tablet by mouth every 4 (four) hours as needed for pain. 10/25/12   Juliet Rude. Pickering, MD  sulfamethoxazole-trimethoprim (BACTRIM DS,SEPTRA DS) 800-160 MG per tablet Take 1 tablet by mouth 2 (two) times daily. 10/25/12    Juliet Rude. Rubin Payor, MD    Allergies:   Allergies  Allergen Reactions  . Penicillins Shortness Of Breath  . Codeine Nausea And Vomiting    Social History:  reports that she has never smoked. She does not have any smokeless tobacco history on file. She reports that she does not drink alcohol or use illicit drugs.  Family History: History reviewed. No pertinent family history.  Physical Exam: Filed Vitals:   10/25/12 1630 10/25/12 2023 10/25/12 2045 10/25/12 2047  BP: 139/58 177/89 141/72   Pulse: 64 124 124   Temp:  101.7 F (38.7 C) 100.3 F (37.9 C)   TempSrc:  Oral Oral   Resp:  20 18   SpO2: 96% 96% 89% 95%   General appearance: alert, cooperative and no distress Head: Normocephalic, without obvious abnormality, atraumatic Eyes: negative Nose: Nares normal. Septum midline. Mucosa normal. No drainage or sinus tenderness. Neck: no JVD and supple, symmetrical, trachea midline Lungs: clear to auscultation bilaterally Heart: regular rate and rhythm, S1, S2 normal, no murmur, click, rub or gallop Abdomen: soft ttp left flank nd pos bs no r/g nonacute abd Extremities: extremities normal, atraumatic, no cyanosis or edema Pulses: 2+ and symmetric Skin: Skin color, texture, turgor normal. No rashes or lesions Neurologic: Grossly normal    Labs  on Admission:   Recent Labs  10/25/12 1721  NA 134*  K 4.3  CL 102  GLUCOSE 145*  BUN 16  CREATININE 1.30*    Recent Labs  10/25/12 1710 10/25/12 1721  WBC 17.8*  --   NEUTROABS 15.7*  --   HGB 11.9* 12.9  HCT 36.5 38.0  MCV 90.1  --   PLT 257  --    Radiological Exams on Admission: No results found.  Assessment/Plan  79 yo female with obstructive infected left ureteral stone Principal Problem:   Hypertension Active Problems:   Urinary tract obstruction due to kidney stone  Agree with OR tonight.  Place on ciprofloxacin.  Blood cultures pending.  Pt should do well once stone removed hopefully.  She  wishes to be DNR, does not ever want cpr or intubation in the future except for this surgery if needed.  Place on tele after surgery if everything goes well in OR tonight.    DAVID,RACHAL A 10/25/2012, 9:57 PM

## 2012-10-25 NOTE — Transfer of Care (Signed)
Immediate Anesthesia Transfer of Care Note  Patient: Leodis Binet  Procedure(s) Performed: Procedure(s) with comments: CYSTOSCOPY/LEFT RETROGRADE PYELOGRAM/PLACEMENT LEFT URETERAL STENT (Left) - CYSTOSCOPY/LEFT RETROGRADE PYELOGRAM/URETEROSCOPY/PLACEMENT URETERAL STENT  Patient Location: PACU  Anesthesia Type:MAC  Level of Consciousness: awake, alert  and oriented  Airway & Oxygen Therapy: Patient Spontanous Breathing and Patient connected to face mask oxygen  Post-op Assessment: Report given to PACU RN and Post -op Vital signs reviewed and stable  Post vital signs: Reviewed and stable  Complications: No apparent anesthesia complications

## 2012-10-25 NOTE — ED Provider Notes (Addendum)
History    CSN: [id number] Arrival date & time 10/25/12  1619  First MD Initiated Contact with Patient 10/25/12 1625     Chief Complaint  Patient presents with  . Flank Pain   (Consider location/radiation/quality/duration/timing/severity/associated sxs/prior Treatment) Patient is a 79 y.o. female presenting with flank pain. The history is provided by the patient.  Flank Pain This is a new problem. Pertinent negatives include no chest pain, no abdominal pain, no headaches and no shortness of breath.   patient presents with left abdominal/flank pain. She got seen at [facility] by her PCP and sent for a CT. She was sent in with a kidney stone. No recent kidney stones. Past Medical History  Diagnosis Date  . Hypertension    Past Surgical History  Procedure Laterality Date  . Appendectomy    . Back surgery    . Abdominal hysterectomy     History reviewed. No pertinent family history. History  Substance Use Topics  . Smoking status: Never Smoker   . Smokeless tobacco: Not on file  . Alcohol Use: No   OB History   Grav Para Term Preterm Abortions TAB SAB Ect Mult Living                 Review of Systems  Constitutional: Negative for activity change and appetite change.  HENT: Negative for neck stiffness.   Eyes: Negative for pain.  Respiratory: Negative for chest tightness and shortness of breath.   Cardiovascular: Negative for chest pain and leg swelling.  Gastrointestinal: Positive for nausea. Negative for vomiting, abdominal pain and diarrhea.  Genitourinary: Positive for flank pain.  Musculoskeletal: Negative for back pain.  Skin: Negative for rash.  Neurological: Negative for weakness, numbness and headaches.  Psychiatric/Behavioral: Negative for behavioral problems.    Allergies  Penicillins and Codeine  Home Medications   Current Outpatient Rx  Name  Route  Sig  Dispense  Refill  . ALPRAZolam (XANAX) 0.25 MG tablet   Oral   Take 0.25 mg by mouth at  bedtime.         Marland Kitchen amLODipine (NORVASC) 5 MG tablet   Oral   Take 5 mg by mouth daily.         Marland Kitchen aspirin EC 81 MG tablet   Oral   Take 81 mg by mouth daily.         . folic acid (FOLVITE) 1 MG tablet   Oral   Take 1 mg by mouth daily.         Marland Kitchen lisinopril (PRINIVIL,ZESTRIL) 10 MG tablet   Oral   Take 10 mg by mouth daily.         . metoprolol tartrate (LOPRESSOR) 25 MG tablet   Oral   Take 12.5 mg by mouth 2 (two) times daily. Take 1/2 tablet (12.5mg ) twice daily         . traMADol (ULTRAM) 50 MG tablet   Oral   Take 50 mg by mouth every 6 (six) hours as needed for pain.         Marland Kitchen HYDROcodone-acetaminophen (NORCO/VICODIN) 5-325 MG per tablet   Oral   Take 1 tablet by mouth every 4 (four) hours as needed for pain.   10 tablet   0   . sulfamethoxazole-trimethoprim (BACTRIM DS,SEPTRA DS) 800-160 MG per tablet   Oral   Take 1 tablet by mouth 2 (two) times daily.   6 tablet   0    BP 139/58  Pulse 64  Temp(Src)  97.9 F (36.6 C) (Oral)  Resp 16  SpO2 96% Physical Exam  Nursing note and vitals reviewed. Constitutional: She is oriented to person, place, and time. She appears well-developed and well-nourished.  HENT:  Head: Normocephalic and atraumatic.  Eyes: EOM are normal. Pupils are equal, round, and reactive to light.  Neck: Normal range of motion. Neck supple.  Cardiovascular: Normal rate, regular rhythm and normal heart sounds.   No murmur heard. Pulmonary/Chest: Effort normal and breath sounds normal. No respiratory distress. She has no wheezes. She has no rales.  Abdominal: Soft. Bowel sounds are normal. She exhibits no distension. There is tenderness. There is no rebound and no guarding.  Left lower abdominal tenderness. No CVA tenderness. Patient appears uncomfortable.  Musculoskeletal: Normal range of motion.  Neurological: She is alert and oriented to person, place, and time. No cranial nerve deficit.  Skin: Skin is warm and dry.   Psychiatric: She has a normal mood and affect. Her speech is normal.    ED Course  Procedures (including critical care time) Labs Reviewed  CBC WITH DIFFERENTIAL - Abnormal; Notable for the following:    WBC 17.8 (*)    Hemoglobin 11.9 (*)    Neutrophils Relative % 88 (*)    Neutro Abs 15.7 (*)    Lymphocytes Relative 6 (*)    Monocytes Absolute 1.1 (*)    All other components within normal limits  URINALYSIS, ROUTINE W REFLEX MICROSCOPIC - Abnormal; Notable for the following:    APPearance CLOUDY (*)    Leukocytes, UA MODERATE (*)    All other components within normal limits  POCT I-STAT, CHEM 8 - Abnormal; Notable for the following:    Sodium 134 (*)    Creatinine, Ser 1.30 (*)    Glucose, Bld 145 (*)    All other components within normal limits  URINE MICROSCOPIC-ADD ON   No results found. 1. Ureteral stone   2. Leukocytosis     MDM  Patient with left flank and abdominal pain. CT shows ureteral stone. White count is elevated without clear infection. Patient has a minimally elevated creatinine. CT reviewed by Dr. Brunilda Payor. Pain control the patient will followup  Juliet Rude. Rubin Payor, MD 10/25/12 2002  On discharge vitals patient developed a fever of 101.7 and tachycardia. Will rediscuss with Dr. Brunilda Payor and may require admission.  Patient is to be admitted to medicine with Dr. Brunilda Payor putting a stent in tonight.  Juliet Rude. Rubin Payor, MD 10/25/12 2154

## 2012-10-25 NOTE — Op Note (Signed)
Stephanie Littman is a 79 y.o.   10/25/2012  General  Preop diagnosis: Urosepsis, left hydronephrosis, left ureteral stone  Postop diagnosis: Same  Procedure done: Cystoscopy, left retrograde pyelogram, insertion of double-J stent  Surgeon: Wendie Simmer. Halayna Blane  Anesthesia: Monitor anesthesia care  Indication: Patient is a 79 years old female who was seen in the emergency room tonight with a history of sudden onset of severe left flank pain this morning associated with nausea and vomiting. She was seen by her primary care physician who requested a CT scan that showed left perinephric stranding, moderate left hydronephrosis and a left distal ureteral calculus. She spiked a temperature to 101.7 in the emergency room. She needs a stent placement to decompress the left kidney.  Procedure: Patient was identified by her wrist band and proper timeout was taken.  Under monitored anesthesia care she was prepped and draped and placed in the dorsolithotomy position. A panendoscope was inserted in the bladder. The bladder mucosa is normal. There is no stone or tumor in the bladder. The ureteral orifices are in normal position and shape.  Left retrograde pyelogram:  A cone-tip catheter was passed through the cystoscope and the left ureteral orifice. 7 cc of contrast were then injected through the cone-tip catheter. The distal ureter is normal. There is a stone in the mid ureter. The proximal ureter appears moderately dilated. The cone-tip catheter was then removed. A sensor wire was passed through the cystoscope and the left ureter.  A #[id number] French-[id number] double-J stent was passed over the sensor wire. The proximal curl of the double-J stent is in the renal pelvis. The distal curl is in the bladder. The bladder was then emptied and the cystoscope and sensor wire were removed.  The patient tolerated the procedure well and left the OR in satisfactory condition to postanesthesia care unit

## 2012-10-25 NOTE — ED Notes (Signed)
Onset left flank pain this morning. Pt went to [facility], Dr told pt it was kidney stone and sent pt to ED.

## 2012-10-25 NOTE — Consult Note (Signed)
Urology Consult  Referring physician: Dr Benjiman Core Reason for referral: Left flank pan, fever  Chief Complaint: Left flank pain  History of Present Illness: The patient is a 79 years old female who saw her PCP today with sudden onset of severe left flank pain associated with nausea and vomiting.  CT scan showed left perinephric stranding, moderate left hydronephrosis and a stone in the left distal ureter.  She was given IV analgesics and felt better after a while.  Then she spiked a temperature to 101.7. I was asked to see her in consultation.  Past Medical History  Diagnosis Date  . Hypertension    Past Surgical History  Procedure Laterality Date  . Appendectomy    . Back surgery    . Abdominal hysterectomy      Medications: Xanax, Norvasc, Aspirin, folic acid, lisinopril, metoprolol, tramadol Allergies:  Allergies  Allergen Reactions  . Penicillins Shortness Of Breath  . Codeine Nausea And Vomiting    History reviewed. No pertinent family history. Social History:  reports that she has never smoked. She does not have any smokeless tobacco history on file. She reports that she does not drink alcohol or use illicit drugs.  ROS: All systems are reviewed and negative except as noted.   Physical Exam:  Vital signs in last 24 hours: Temp:  [97.9 F (36.6 C)-101.7 F (38.7 C)] 100.3 F (37.9 C) (07/17 2045) Pulse Rate:  [64-124] 124 (07/17 2045) Resp:  [16-20] 18 (07/17 2045) BP: (139-177)/(58-89) 141/72 mmHg (07/17 2045) SpO2:  [89 %-97 %] 95 % (07/17 2047) HEENT:  Normal.  Neck: Supple.  No cervical adenopathy.  No thyromegaly. Cardiovascular: Skin warm; not flushed Respiratory: Breaths quiet; no shortness of breath Abdomen: No masses Neurological: Normal sensation to touch Musculoskeletal: Normal motor function arms and legs Lymphatics: No inguinal adenopathy Skin: No rashes Genitourinary:Normal female genitalia.  Uterus absent.  Laboratory Data:  Results  for orders placed during the hospital encounter of 10/25/12 (from the past 72 hour(s))  URINALYSIS, ROUTINE W REFLEX MICROSCOPIC     Status: Abnormal   Collection Time    10/25/12  4:46 PM      Result Value Range   Color, Urine YELLOW  YELLOW   APPearance CLOUDY (*) CLEAR   Specific Gravity, Urine 1.018  1.005 - 1.030   pH 7.5  5.0 - 8.0   Glucose, UA NEGATIVE  NEGATIVE mg/dL   Hgb urine dipstick NEGATIVE  NEGATIVE   Bilirubin Urine NEGATIVE  NEGATIVE   Ketones, ur NEGATIVE  NEGATIVE mg/dL   Protein, ur NEGATIVE  NEGATIVE mg/dL   Urobilinogen, UA 0.2  0.0 - 1.0 mg/dL   Nitrite NEGATIVE  NEGATIVE   Leukocytes, UA MODERATE (*) NEGATIVE  URINE MICROSCOPIC-ADD ON     Status: None   Collection Time    10/25/12  4:46 PM      Result Value Range   Squamous Epithelial / LPF RARE  RARE   WBC, UA 7-10  <3 WBC/hpf  CBC WITH DIFFERENTIAL     Status: Abnormal   Collection Time    10/25/12  5:10 PM      Result Value Range   WBC 17.8 (*) 4.0 - 10.5 K/uL   RBC 4.05  3.87 - 5.11 MIL/uL   Hemoglobin 11.9 (*) 12.0 - 15.0 g/dL   HCT 09.8  11.9 - 14.7 %   MCV 90.1  78.0 - 100.0 fL   MCH 29.4  26.0 - 34.0 pg   MCHC 32.6  30.0 - 36.0 g/dL   RDW 91.4  78.2 - 95.6 %   Platelets 257  150 - 400 K/uL   Neutrophils Relative % 88 (*) 43 - 77 %   Neutro Abs 15.7 (*) 1.7 - 7.7 K/uL   Lymphocytes Relative 6 (*) 12 - 46 %   Lymphs Abs 1.0  0.7 - 4.0 K/uL   Monocytes Relative 6  3 - 12 %   Monocytes Absolute 1.1 (*) 0.1 - 1.0 K/uL   Eosinophils Relative 0  0 - 5 %   Eosinophils Absolute 0.0  0.0 - 0.7 K/uL   Basophils Relative 0  0 - 1 %   Basophils Absolute 0.0  0.0 - 0.1 K/uL  POCT I-STAT, CHEM 8     Status: Abnormal   Collection Time    10/25/12  5:21 PM      Result Value Range   Sodium 134 (*) 135 - 145 mEq/L   Potassium 4.3  3.5 - 5.1 mEq/L   Chloride 102  96 - 112 mEq/L   BUN 16  6 - 23 mg/dL   Creatinine, Ser 2.13 (*) 0.50 - 1.10 mg/dL   Glucose, Bld 086 (*) 70 - 99 mg/dL   Calcium, Ion  5.78  4.69 - 1.30 mmol/L   TCO2 21  0 - 100 mmol/L   Hemoglobin 12.9  12.0 - 15.0 g/dL   HCT 62.9  52.8 - 41.3 %   No results found for this or any previous visit (from the past 240 hour(s)). Creatinine:  Recent Labs  10/25/12 1721  CREATININE 1.30*    Xrays: CT scan shows perinephric stranding a calculus in the left distal ureter.  Impression/Assessment:  Left distal ureteral calculus, moderate hydronephrosis.  Urosepsis.  Plan: Because of her pain and fever I believe she would benefit from stent placement.  Cystoscopy, left retrograde pyelogram, JJ stent placement  Shawntae Lowy-HENRY 10/25/2012, 9:34 PM   CC: Dr Benjiman Core

## 2012-10-25 NOTE — ED Notes (Signed)
ZOX:[id number]<AV> Expected date:<BR> Expected time:<BR> Means of arrival:<BR> Comments:<BR> Hold for [place], possible kidney stone

## 2012-10-26 ENCOUNTER — Encounter (HOSPITAL_COMMUNITY): Payer: Self-pay | Admitting: Urology

## 2012-10-26 LAB — CBC
MCH: 30.5 pg (ref 26.0–34.0)
MCV: 91.7 fL (ref 78.0–100.0)
Platelets: 191 10*3/uL (ref 150–400)
RBC: 3.15 MIL/uL — ABNORMAL LOW (ref 3.87–5.11)

## 2012-10-26 LAB — BASIC METABOLIC PANEL
BUN: 18 mg/dL (ref 6–23)
CO2: 20 mEq/L (ref 19–32)
Calcium: 8.2 mg/dL — ABNORMAL LOW (ref 8.4–10.5)
Creatinine, Ser: 1.4 mg/dL — ABNORMAL HIGH (ref 0.50–1.10)
Glucose, Bld: 145 mg/dL — ABNORMAL HIGH (ref 70–99)
Sodium: 131 mEq/L — ABNORMAL LOW (ref 135–145)

## 2012-10-26 MED ORDER — OXYBUTYNIN CHLORIDE 5 MG PO TABS
5.0000 mg | ORAL_TABLET | Freq: Three times a day (TID) | ORAL | Status: DC
  Filled 2012-10-26 (×2): qty 1

## 2012-10-26 MED ORDER — OXYBUTYNIN CHLORIDE 5 MG PO TABS
5.0000 mg | ORAL_TABLET | Freq: Three times a day (TID) | ORAL | Status: DC | PRN
  Filled 2012-10-26 (×2): qty 1

## 2012-10-26 MED ORDER — BISACODYL 10 MG RE SUPP
10.0000 mg | Freq: Every day | RECTAL | Status: DC | PRN
  Filled 2012-10-26: qty 1

## 2012-10-26 NOTE — Anesthesia Postprocedure Evaluation (Signed)
Anesthesia Post Note  Patient: Lisa Freeman  Procedure(s) Performed: Procedure(s) (LRB): CYSTOSCOPY/LEFT RETROGRADE PYELOGRAM/PLACEMENT LEFT URETERAL STENT (Left)  Anesthesia type: General  Patient location: PACU  Post pain: Pain level controlled  Post assessment: Post-op Vital signs reviewed  Last Vitals:  Filed Vitals:   10/25/12 2330  BP: 114/60  Pulse: 80  Temp: 36.9 C  Resp: 20    Post vital signs: Reviewed  Level of consciousness: sedated  Complications: No apparent anesthesia complications

## 2012-10-26 NOTE — Care Management Note (Addendum)
    Page 1 of 1   10/28/2012     4:57:59 PM   CARE MANAGEMENT NOTE 10/28/2012  Patient:  [facility]   Account Number:  [id number]  Date Initiated:  10/26/2012  Documentation initiated by:  Lanier Clam  Subjective/Objective Assessment:   ADMITTED W/UTI.L URETERAL STONE.     Action/Plan:   FROM HOME W/DAUGHTER.HAS PCP,PHARMACY.HAS CANE,RW.   Anticipated DC Date:  10/28/2012   Anticipated DC Plan:  HOME/SELF CARE      DC Planning Services  CM consult      Choice offered to / List presented to:             Status of service:  Completed, signed off Medicare Important Message given?   (If response is "NO", the following Medicare IM given date fields will be blank) Date Medicare IM given:   Date Additional Medicare IM given:    Discharge Disposition:  HOME/SELF CARE  Per UR Regulation:  Reviewed for med. necessity/level of care/duration of stay  If discussed at Long Length of Stay Meetings, dates discussed:    Comments:  10/28/12 Carrell Palmatier RN,BSN NCM [phone] D/C HOME NO ORDERS OR NEEDS.  10/26/12 Jaisen Wiltrout RN,BSN NCM [phone] POD#1 DOUBLE J STENT,CLEARS,IV ABX.

## 2012-10-26 NOTE — Progress Notes (Signed)
[facility] PROGRESS NOTE  Lisa Freeman ZOX:[id number] DOB: 05-09-1918 DOA: 10/25/2012 PCP: CRAFT,PAUL E., PA-C  Assessment/Plan: 1. Urinary tract obstruction due to kidney stone- s/p cystoscopy and stent placement, will continue with cipro.  2. Leukocytosis- WBC is elevated, will follow the urine culture results. Continue cipro at this time. 3. Hypertension- BP controlled, continue amlodipine, lisinopril, metoprolol. 4. DVT Prophylaxis- SCD  Code Status: DNR Family Communication: *Discussed with patient Disposition Plan: Home when stable   Consultants:  Urology  Procedures: Cystoscopy, left retrograde pyelogram, insertion of double-J stent   Antibiotics:  Cipro 7/17  HPI/Subjective: Patient seen, s/p cystoscopy and double J stent. No pain at this time.  Objective: Filed Vitals:   10/25/12 2315 10/25/12 2330 10/26/12 0545 10/26/12 1341  BP: 113/48 114/60 94/90 109/52  Pulse:  80 65 60  Temp: 97.8 F (36.6 C) 98.5 F (36.9 C) 97.9 F (36.6 C) 98.3 F (36.8 C)  TempSrc:   Oral Oral  Resp:  20 18 20   Height:      Weight:   54.8 kg (120 lb 13 oz)   SpO2:  94% 96% 97%    Intake/Output Summary (Last 24 hours) at 10/26/12 1434 Last data filed at 10/26/12 1300  Gross per 24 hour  Intake    895 ml  Output    375 ml  Net    520 ml   Filed Weights   10/25/12 2228 10/26/12 0545  Weight: 54.885 kg (121 lb) 54.8 kg (120 lb 13 oz)    Exam:   General:  Appear in no acute distress  Cardiovascular: s s12 RRR  Respiratory: Clear bilaterally, no wheezing  Abdomen: Soft, nontender  Musculoskeletal: No edema  Data Reviewed: Basic Metabolic Panel:  Recent Labs Lab 10/25/12 1721 10/26/12 0500  NA 134* 131*  K 4.3 4.4  CL 102 101  CO2  --  20  GLUCOSE 145* 145*  BUN 16 18  CREATININE 1.30* 1.40*  CALCIUM  --  8.2*   Liver Function Tests: No results found for this basename: AST, ALT, ALKPHOS, BILITOT, PROT, ALBUMIN,  in the last 168 hours No  results found for this basename: LIPASE, AMYLASE,  in the last 168 hours No results found for this basename: AMMONIA,  in the last 168 hours CBC:  Recent Labs Lab 10/25/12 1710 10/25/12 1721 10/26/12 0500  WBC 17.8*  --  25.4*  NEUTROABS 15.7*  --   --   HGB 11.9* 12.9 9.6*  HCT 36.5 38.0 28.9*  MCV 90.1  --  91.7  PLT 257  --  191      Studies: No results found.  Scheduled Meds: . ALPRAZolam  0.25 mg Oral QHS  . amLODipine  5 mg Oral Daily  . ciprofloxacin  200 mg Intravenous Q12H  . folic acid  1 mg Oral Daily  . lisinopril  10 mg Oral Daily  . metoprolol tartrate  12.5 mg Oral BID  . sodium chloride  3 mL Intravenous Q12H   Continuous Infusions: . sodium chloride 20 mL/hr at 10/26/12 8119    Principal Problem:   Urinary tract obstruction due to kidney stone Active Problems:   Hypertension    Time spent: 30 min    [facility]  [facility] Pager [phone]. If 7PM-7AM, please contact night-coverage at [email or web address], password [facility] 10/26/2012, 2:34 PM  LOS: 1 day

## 2012-10-26 NOTE — Progress Notes (Signed)
1 Day Post-Op Subjective: Patient reports No flank pain.  Has pain in the suprapubic area. Had severe pain with voiding.  No nausea or vomiting.  No fever  Objective: Vital signs in last 24 hours: Temp:  [97.8 F (36.6 C)-101.7 F (38.7 C)] 97.9 F (36.6 C) (07/18 0545) Pulse Rate:  [64-124] 65 (07/18 0545) Resp:  [16-20] 18 (07/18 0545) BP: (94-177)/(41-90) 94/90 mmHg (07/18 0545) SpO2:  [89 %-100 %] 96 % (07/18 0545) Weight:  [54.8 kg (120 lb 13 oz)-54.885 kg (121 lb)] 54.8 kg (120 lb 13 oz) (07/18 0545)  Intake/Output from previous day: 07/17 0701 - 07/18 0700 In: 295 [P.O.:45; I.V.:250] Out: 250 [Urine:250] Intake/Output this shift: Total I/O In: 240 [P.O.:240] Out: 75 [Urine:75]  Physical Exam:  No flank tenderness Abdomen: Soft.  Tender in suprapubic area  Lab Results:  Recent Labs  10/25/12 1710 10/25/12 1721 10/26/12 0500  HGB 11.9* 12.9 9.6*  HCT 36.5 38.0 28.9*   BMET  Recent Labs  10/25/12 1721 10/26/12 0500  NA 134* 131*  K 4.3 4.4  CL 102 101  CO2  --  20  GLUCOSE 145* 145*  BUN 16 18  CREATININE 1.30* 1.40*  CALCIUM  --  8.2*   No results found for this basename: LABPT, INR,  in the last 72 hours No results found for this basename: LABURIN,  in the last 72 hours No results found for this or any previous visit.  Studies/Results: No results found.  Assessment/Plan:  Left ureteral calculus .  Left hydronephrosis.  Urosepsis  The suprapubic pain is probably secondary to bladder spasms due to stent  Advance diet as tolerated.  Bladder scan for post void residual  Ditropan 5 mgm PRN for bladder spasms  Can be discharged urologically when afebrile and pain free.   LOS: 1 day   Lisa Freeman 10/26/2012, 12:29 PM

## 2012-10-27 DIAGNOSIS — N390 Urinary tract infection, site not specified: Secondary | ICD-10-CM

## 2012-10-27 LAB — URINE CULTURE: Colony Count: 80000

## 2012-10-27 LAB — CBC
HCT: 26.3 % — ABNORMAL LOW (ref 36.0–46.0)
MCHC: 32.7 g/dL (ref 30.0–36.0)
RDW: 12.9 % (ref 11.5–15.5)

## 2012-10-27 LAB — BASIC METABOLIC PANEL
BUN: 17 mg/dL (ref 6–23)
Creatinine, Ser: 1.42 mg/dL — ABNORMAL HIGH (ref 0.50–1.10)
GFR calc Af Amer: 36 mL/min — ABNORMAL LOW (ref >90)
GFR calc non Af Amer: 31 mL/min — ABNORMAL LOW (ref >90)
Potassium: 4.3 mEq/L (ref 3.5–5.1)

## 2012-10-27 NOTE — Progress Notes (Signed)
2 Days Post-Op Subjective: Patient reports that she was having some pain in her left flank region last night but that has resolved. She denies any suprapubic discomfort this morning.  Objective: Vital signs in last 24 hours: Temp:  [98.2 F (36.8 C)-98.4 F (36.9 C)] 98.4 F (36.9 C) (07/19 0610) Pulse Rate:  [60-77] 61 (07/19 0610) Resp:  [17-20] 17 (07/19 0610) BP: (109-129)/(41-52) 111/47 mmHg (07/19 0610) SpO2:  [94 %-97 %] 94 % (07/19 0610) Weight:  [54.8 kg (120 lb 13 oz)] 54.8 kg (120 lb 13 oz) (07/19 0500)  Intake/Output from previous day: 07/18 0701 - 07/19 0700 In: 1328 [P.O.:840; I.V.:388; IV Piggyback:100] Out: 1375 [Urine:1375] Intake/Output this shift: Total I/O In: 183 [I.V.:83; IV Piggyback:100] Out: 800 [Urine:800]  Physical Exam:  General:alert and cooperative. She appears to be in no distress. GI: not done and soft, non tender, normal bowel sounds, no palpable masses, no organomegaly, no inguinal hernia Back reveals no CVAT.  Lab Results:  Recent Labs  10/25/12 1710 10/25/12 1721 10/26/12 0500  HGB 11.9* 12.9 9.6*  HCT 36.5 38.0 28.9*   BMET  Recent Labs  10/25/12 1721 10/26/12 0500  NA 134* 131*  K 4.3 4.4  CL 102 101  CO2  --  20  GLUCOSE 145* 145*  BUN 16 18  CREATININE 1.30* 1.40*  CALCIUM  --  8.2*   No results found for this basename: LABPT, INR,  in the last 72 hours No results found for this basename: LABURIN,  in the last 72 hours Results for orders placed during the hospital encounter of 10/25/12  URINE CULTURE     Status: None   Collection Time    10/25/12  8:50 PM      Result Value Range Status   Specimen Description URINE, CLEAN CATCH   Final   Special Requests NONE   Final   Culture  Setup Time 10/26/2012 00:48   Final   Colony Count 80,000 COLONIES/ML   Final   Culture GRAM NEGATIVE RODS   Final   Report Status PENDING   Incomplete    Studies/Results: No results found.  Assessment/Plan: She has remained  afebrile for 24 hours. Her urine culture is not complete but has shown gram-negative rods. She did have some discomfort  In her left flank likely secondary to her stent but that's improved. It would appear that once her culture results return she can be discharged from a urologic standpoint on oral antibiotics.  Await urine culture results    She will followup with Dr. Brunilda Payor as an outpatient.   LOS: 2 days   Kadisha Goodine C 10/27/2012, 6:33 AM

## 2012-10-27 NOTE — Progress Notes (Addendum)
[facility] PROGRESS NOTE  Lisa Freeman VHQ:[id number] DOB: July 06, 1918 DOA: 10/25/2012 PCP: CRAFT,PAUL E., PA-C  Assessment/Plan: 1. Urinary tract obstruction due to kidney stone- s/p cystoscopy and stent placement, will continue with cipro.  2. Leukocytosis- WBC is improving, will follow the urine culture results. Continue cipro at this time. 3. UTI- Urine culture is growing GNR, will await the urine culture results. Continue with cipro. 4. Anemia- Hb is low today, 8.6. ? Dilutional, Check stool for occult blood and follow CBC in am. 5. Hypertension- BP controlled, continue amlodipine, lisinopril, metoprolol. 6. DVT Prophylaxis- SCD  Code Status: DNR Family Communication: *Discussed with patient Disposition Plan: Home when stable   Consultants:  Urology  Procedures: Cystoscopy, left retrograde pyelogram, insertion of double-J stent   Antibiotics:  Cipro 7/17  HPI/Subjective: Patient seen, s/p cystoscopy and double J stent. No pain at this time. Urine culture is growing GNR.  Objective: Filed Vitals:   10/25/12 2315 10/25/12 2330 10/26/12 0545 10/26/12 1341  BP: 113/48 114/60 94/90 109/52  Pulse:  80 65 60  Temp: 97.8 F (36.6 C) 98.5 F (36.9 C) 97.9 F (36.6 C) 98.3 F (36.8 C)  TempSrc:   Oral Oral  Resp:  20 18 20   Height:      Weight:   54.8 kg (120 lb 13 oz)   SpO2:  94% 96% 97%    Intake/Output Summary (Last 24 hours) at 10/26/12 1434 Last data filed at 10/26/12 1300  Gross per 24 hour  Intake    895 ml  Output    375 ml  Net    520 ml   Filed Weights   10/25/12 2228 10/26/12 0545  Weight: 54.885 kg (121 lb) 54.8 kg (120 lb 13 oz)    Exam:   General:  Appear in no acute distress  Cardiovascular: s s12 RRR  Respiratory: Clear bilaterally, no wheezing  Abdomen: Soft, nontender  Musculoskeletal: No edema  Data Reviewed: Basic Metabolic Panel:  Recent Labs Lab 10/25/12 1721 10/26/12 0500  NA 134* 131*  K 4.3 4.4  CL 102  101  CO2  --  20  GLUCOSE 145* 145*  BUN 16 18  CREATININE 1.30* 1.40*  CALCIUM  --  8.2*   CBC:  Recent Labs Lab 10/25/12 1710 10/25/12 1721 10/26/12 0500  WBC 17.8*  --  25.4*  NEUTROABS 15.7*  --   --   HGB 11.9* 12.9 9.6*  HCT 36.5 38.0 28.9*  MCV 90.1  --  91.7  PLT 257  --  191      Studies: No results found.  Scheduled Meds: . ALPRAZolam  0.25 mg Oral QHS  . amLODipine  5 mg Oral Daily  . ciprofloxacin  200 mg Intravenous Q12H  . folic acid  1 mg Oral Daily  . lisinopril  10 mg Oral Daily  . metoprolol tartrate  12.5 mg Oral BID  . sodium chloride  3 mL Intravenous Q12H   Continuous Infusions: . sodium chloride 20 mL/hr at 10/26/12 4132    Principal Problem:   Urinary tract obstruction due to kidney stone Active Problems:   Hypertension    Time spent: 30 min    [facility] S  [facility] Pager [phone]. If 7PM-7AM, please contact night-coverage at [email or web address], password [facility] 10/26/2012, 2:34 PM  LOS: 1 day

## 2012-10-28 LAB — BASIC METABOLIC PANEL
Calcium: 8.3 mg/dL — ABNORMAL LOW (ref 8.4–10.5)
GFR calc Af Amer: 50 mL/min — ABNORMAL LOW (ref >90)
GFR calc non Af Amer: 43 mL/min — ABNORMAL LOW (ref >90)
Sodium: 136 mEq/L (ref 135–145)

## 2012-10-28 LAB — CBC
MCH: 29.7 pg (ref 26.0–34.0)
MCHC: 32.3 g/dL (ref 30.0–36.0)
Platelets: 160 10*3/uL (ref 150–400)
RBC: 2.93 MIL/uL — ABNORMAL LOW (ref 3.87–5.11)

## 2012-10-28 MED ORDER — OXYBUTYNIN CHLORIDE 5 MG PO TABS: 5.0000 mg | ORAL_TABLET | Freq: Three times a day (TID) | ORAL | Status: DC | PRN

## 2012-10-28 MED ORDER — HYDROCODONE-ACETAMINOPHEN 5-325 MG PO TABS: 1.0000 | ORAL_TABLET | ORAL | Status: DC | PRN

## 2012-10-28 MED ORDER — SULFAMETHOXAZOLE-TRIMETHOPRIM 800-160 MG PO TABS: 1.0000 | ORAL_TABLET | Freq: Two times a day (BID) | ORAL | Status: DC

## 2012-10-28 NOTE — Progress Notes (Signed)
Pt discharged to home. DC instructions given with dtr at bedside. Prescriptions also given. No concerns voiced. Left unit in wheelchair pushed by nurse tech. Left in good condition. Vwilliams,rn.

## 2012-10-28 NOTE — Discharge Summary (Addendum)
Physician Discharge Summary  Shailynn Fong RUE:[id number] DOB: 07-Feb-1919 DOA: 10/25/2012  PCP: Suszanne Conners E., PA-C  Admit date: 10/25/2012 Discharge date: 10/28/2012  Time spent: 50* minutes  Recommendations for Outpatient Follow-up:  Follow up PCP in 2 weeks for recheck CBC for anemia Follow up Dr Brunilda Payor in one week  Discharge Diagnoses:  Principal Problem:   Urinary tract obstruction due to kidney stone Active Problems:   Hypertension   Discharge Condition: Stable  Diet recommendation: Low salt diet  Filed Weights   10/26/12 0545 10/27/12 0500 10/28/12 0500  Weight: 54.8 kg (120 lb 13 oz) 54.8 kg (120 lb 13 oz) 53.1 kg (117 lb 1 oz)    History of present illness:  79 yo female with one day h/o left flank and llq abd pain. Feels awful. No n/v. No fevers but has spiked a temp in ED. Was seen by pcp today and had ct done as outpt which showed an obstructed infected left ureteral stone. Pt is very healthy and lives with her daughter who is present in ED. Pt pain initially was 20/10 now is 7/10. She is about to go to the OR with urology for stone removal and possible stent.    Hospital Course:  Urinary tract obstruction due to kidney stone- s/p cystoscopy and stent placement, will send home on Bactrim for 2 weeks  UTI- Urine culture is growing GNR,  E coli sensitive to bactrim. Will send home on Bactrim DS for one week. Anemia- Hb has been stable over past 24 hrs, today 8.7, was  8.6.  Yesterday. Guaic stool is positive. Patient has h/o Diverticulosis, and had colonoscopy in past, she does not want to go through colonoscopy again . Will need to recheck CBC in 2 weeks. Called and discussed with daughter. Hypertension- BP controlled, continue amlodipine, lisinopril, metoprolol.   Procedures:  Cystoscopy, stent placement  Consultations:  Urology  Discharge Exam: Filed Vitals:   10/27/12 0610 10/27/12 1300 10/27/12 2047 10/28/12 0500  BP: 111/47 112/55 121/53 135/48  Pulse:  61 65 70 56  Temp: 98.4 F (36.9 C) 98.5 F (36.9 C) 98.7 F (37.1 C) 98.1 F (36.7 C)  TempSrc: Oral Oral Oral Oral  Resp: 17 18 20 20   Height:      Weight:    53.1 kg (117 lb 1 oz)  SpO2: 94% 95% 98% 97%    General: Appear in no acute distress Cardiovascular: S1s2 RRR Respiratory: *Clear bilaterally Ext : No edema  Discharge Instructions  Discharge Orders   Future Orders Complete By Expires     Diet - low sodium heart healthy  As directed     Discharge instructions  As directed     Comments:      Check CBC at the PCP office in 2 weeks for anemia.    Increase activity slowly  As directed         Medication List         ALPRAZolam 0.25 MG tablet  Commonly known as:  XANAX  Take 0.25 mg by mouth at bedtime.     amLODipine 5 MG tablet  Commonly known as:  NORVASC  Take 5 mg by mouth daily.     aspirin EC 81 MG tablet  Take 81 mg by mouth daily.     folic acid 1 MG tablet  Commonly known as:  FOLVITE  Take 1 mg by mouth daily.     HYDROcodone-acetaminophen 5-325 MG per tablet  Commonly known as:  NORCO/VICODIN  Take  1 tablet by mouth every 4 (four) hours as needed for pain.     lisinopril 10 MG tablet  Commonly known as:  PRINIVIL,ZESTRIL  Take 10 mg by mouth daily.     metoprolol tartrate 25 MG tablet  Commonly known as:  LOPRESSOR  Take 12.5 mg by mouth 2 (two) times daily. Take 1/2 tablet (12.5mg ) twice daily     oxybutynin 5 MG tablet  Commonly known as:  DITROPAN  Take 1 tablet (5 mg total) by mouth every 8 (eight) hours as needed.     sulfamethoxazole-trimethoprim 800-160 MG per tablet  Commonly known as:  BACTRIM DS,SEPTRA DS  Take 1 tablet by mouth 2 (two) times daily.     traMADol 50 MG tablet  Commonly known as:  ULTRAM  Take 50 mg by mouth every 6 (six) hours as needed for pain.       Allergies  Allergen Reactions  . Penicillins Shortness Of Breath  . Codeine Nausea And Vomiting       Follow-up Information   Follow up with  NESI,MARC-HENRY, MD.   Contact information:   [address] [place] [address] [phone]       Follow up with [facility]. (As needed)    Contact information:   [address] [place] [address] [place] [address] [phone]       The results of significant diagnostics from this hospitalization (including imaging, microbiology, ancillary and laboratory) are listed below for reference.    Significant Diagnostic Studies: No results found.  Microbiology: Recent Results (from the past 240 hour(s))  URINE CULTURE     Status: None   Collection Time    10/25/12  8:50 PM      Result Value Range Status   Specimen Description URINE, CLEAN CATCH   Final   Special Requests NONE   Final   Culture  Setup Time 10/26/2012 00:48   Final   Colony Count 80,000 COLONIES/ML   Final   Culture ESCHERICHIA COLI   Final   Report Status 10/27/2012 FINAL   Final   Organism ID, Bacteria ESCHERICHIA COLI   Final  CULTURE, BLOOD (ROUTINE X 2)     Status: None   Collection Time    10/25/12  9:30 PM      Result Value Range Status   Specimen Description BLOOD RIGHT ANTECUBITAL   Final   Special Requests BOTTLES DRAWN AEROBIC AND ANAEROBIC 5CC   Final   Culture  Setup Time 10/26/2012 03:37   Final   Culture     Final   Value:        BLOOD CULTURE RECEIVED NO GROWTH TO DATE CULTURE WILL BE HELD FOR 5 DAYS BEFORE ISSUING A FINAL NEGATIVE REPORT   Report Status PENDING   Incomplete  CULTURE, BLOOD (ROUTINE X 2)     Status: None   Collection Time    10/25/12  9:45 PM      Result Value Range Status   Specimen Description BLOOD RIGHT HAND   Final   Special Requests BOTTLES DRAWN AEROBIC AND ANAEROBIC 3CC   Final   Culture  Setup Time 10/26/2012 03:37   Final   Culture     Final   Value:        BLOOD CULTURE RECEIVED NO GROWTH TO DATE CULTURE WILL  BE HELD FOR 5 DAYS BEFORE ISSUING A FINAL NEGATIVE REPORT   Report Status PENDING   Incomplete      Labs: Basic Metabolic Panel:  Recent Labs Lab 10/25/12 1721 10/26/12 0500 10/27/12 0546 10/28/12 0455  NA 134* 131* 132* 136  K 4.3 4.4 4.3 3.8  CL 102 101 101 106  CO2  --  20 20 22   GLUCOSE 145* 145* 98 93  BUN 16 18 17 11   CREATININE 1.30* 1.40* 1.42* 1.09  CALCIUM  --  8.2* 8.7 8.3*   Liver Function Tests: No results found for this basename: AST, ALT, ALKPHOS, BILITOT, PROT, ALBUMIN,  in the last 168 hours No results found for this basename: LIPASE, AMYLASE,  in the last 168 hours No results found for this basename: AMMONIA,  in the last 168 hours CBC:  Recent Labs Lab 10/25/12 1710 10/25/12 1721 10/26/12 0500 10/27/12 0546 10/28/12 0455  WBC 17.8*  --  25.4* 9.7 6.0  NEUTROABS 15.7*  --   --   --   --   HGB 11.9* 12.9 9.6* 8.6* 8.7*  HCT 36.5 38.0 28.9* 26.3* 26.9*  MCV 90.1  --  91.7 91.6 91.8  PLT 257  --  191 166 160   Cardiac Enzymes: No results found for this basename: CKTOTAL, CKMB, CKMBINDEX, TROPONINI,  in the last 168 hours BNP: BNP (last 3 results) No results found for this basename: PROBNP,  in the last 8760 hours CBG: No results found for this basename: GLUCAP,  in the last 168 hours     Signed:  LAMA,GAGAN S  [facility] 10/28/2012, 8:58 AM

## 2012-11-01 LAB — CULTURE, BLOOD (ROUTINE X 2): Culture: NO GROWTH

## 2012-11-05 ENCOUNTER — Other Ambulatory Visit: Payer: Self-pay | Admitting: Urology

## 2012-11-06 ENCOUNTER — Other Ambulatory Visit: Payer: Self-pay

## 2012-11-06 ENCOUNTER — Ambulatory Visit (HOSPITAL_BASED_OUTPATIENT_CLINIC_OR_DEPARTMENT_OTHER)
Admission: RE | Admit: 2012-11-06 | Discharge: 2012-11-06 | Disposition: A | Discharge status: Home / Self Care | Payer: Medicare Other | Source: Ambulatory Visit | Attending: Urology | Admitting: Urology

## 2012-11-06 ENCOUNTER — Ambulatory Visit: Admit: 2012-11-06 | Payer: Self-pay | Admitting: Urology

## 2012-11-06 ENCOUNTER — Encounter (HOSPITAL_BASED_OUTPATIENT_CLINIC_OR_DEPARTMENT_OTHER): Payer: Self-pay | Admitting: Anesthesiology

## 2012-11-06 ENCOUNTER — Ambulatory Visit (HOSPITAL_COMMUNITY): Payer: Medicare Other

## 2012-11-06 ENCOUNTER — Ambulatory Visit (HOSPITAL_BASED_OUTPATIENT_CLINIC_OR_DEPARTMENT_OTHER): Payer: Medicare Other | Admitting: Anesthesiology

## 2012-11-06 ENCOUNTER — Encounter (HOSPITAL_BASED_OUTPATIENT_CLINIC_OR_DEPARTMENT_OTHER)
Admission: RE | Disposition: A | Discharge status: Home / Self Care | Payer: Self-pay | Source: Ambulatory Visit | Attending: Urology

## 2012-11-06 ENCOUNTER — Encounter (HOSPITAL_BASED_OUTPATIENT_CLINIC_OR_DEPARTMENT_OTHER): Payer: Self-pay | Admitting: *Deleted

## 2012-11-06 DIAGNOSIS — Z85828 Personal history of other malignant neoplasm of skin: Secondary | ICD-10-CM | POA: Insufficient documentation

## 2012-11-06 DIAGNOSIS — M129 Arthropathy, unspecified: Secondary | ICD-10-CM | POA: Insufficient documentation

## 2012-11-06 DIAGNOSIS — N201 Calculus of ureter: Secondary | ICD-10-CM | POA: Insufficient documentation

## 2012-11-06 DIAGNOSIS — R339 Retention of urine, unspecified: Secondary | ICD-10-CM | POA: Insufficient documentation

## 2012-11-06 DIAGNOSIS — Z885 Allergy status to narcotic agent status: Secondary | ICD-10-CM | POA: Insufficient documentation

## 2012-11-06 DIAGNOSIS — I10 Essential (primary) hypertension: Secondary | ICD-10-CM | POA: Insufficient documentation

## 2012-11-06 DIAGNOSIS — E7800 Pure hypercholesterolemia, unspecified: Secondary | ICD-10-CM | POA: Insufficient documentation

## 2012-11-06 DIAGNOSIS — Z880 Allergy status to penicillin: Secondary | ICD-10-CM | POA: Insufficient documentation

## 2012-11-06 DIAGNOSIS — D649 Anemia, unspecified: Secondary | ICD-10-CM | POA: Insufficient documentation

## 2012-11-06 DIAGNOSIS — Z7982 Long term (current) use of aspirin: Secondary | ICD-10-CM | POA: Insufficient documentation

## 2012-11-06 DIAGNOSIS — Z79899 Other long term (current) drug therapy: Secondary | ICD-10-CM | POA: Insufficient documentation

## 2012-11-06 HISTORY — PX: CYSTOSCOPY WITH STENT PLACEMENT: SHX5790

## 2012-11-06 HISTORY — PX: CYSTOSCOPY WITH URETEROSCOPY AND STENT PLACEMENT: SHX6377

## 2012-11-06 HISTORY — PX: CYSTOSCOPY W/ RETROGRADES: SHX1426

## 2012-11-06 HISTORY — PX: CYSTOSCOPY W/ URETERAL STENT REMOVAL: SHX1430

## 2012-11-06 LAB — POCT I-STAT 4, (NA,K, GLUC, HGB,HCT)
Glucose, Bld: 99 mg/dL (ref 70–99)
HCT: 46 % (ref 36.0–46.0)
Hemoglobin: 15.6 g/dL — ABNORMAL HIGH (ref 12.0–15.0)
Sodium: 137 mEq/L (ref 135–145)

## 2012-11-06 SURGERY — CYSTOSCOPY WITH URETEROSCOPY
Anesthesia: General | Laterality: Left

## 2012-11-06 SURGERY — CYSTOURETEROSCOPY, WITH STENT INSERTION
Anesthesia: General | Site: Ureter | Laterality: Left | Wound class: Clean Contaminated

## 2012-11-06 MED ORDER — PROPOFOL 10 MG/ML IV BOLUS
INTRAVENOUS | Status: DC | PRN
  Administered 2012-11-06: 30 mg via INTRAVENOUS
  Administered 2012-11-06: 70 mg via INTRAVENOUS

## 2012-11-06 MED ORDER — ROCURONIUM BROMIDE 100 MG/10ML IV SOLN
INTRAVENOUS | Status: DC | PRN
  Administered 2012-11-06: 20 mg via INTRAVENOUS

## 2012-11-06 MED ORDER — CIPROFLOXACIN IN D5W 400 MG/200ML IV SOLN
400.0000 mg | INTRAVENOUS | Status: AC
  Administered 2012-11-06 (×2): 400 mg via INTRAVENOUS
  Filled 2012-11-06: qty 200

## 2012-11-06 MED ORDER — ONDANSETRON HCL 4 MG/2ML IJ SOLN
INTRAMUSCULAR | Status: DC | PRN
  Administered 2012-11-06: 4 mg via INTRAVENOUS

## 2012-11-06 MED ORDER — FENTANYL CITRATE 0.05 MG/ML IJ SOLN
INTRAMUSCULAR | Status: DC | PRN
  Administered 2012-11-06 (×2): 50 ug via INTRAVENOUS

## 2012-11-06 MED ORDER — GLYCOPYRROLATE 0.2 MG/ML IJ SOLN
INTRAMUSCULAR | Status: DC | PRN
  Administered 2012-11-06: 0.4 mg via INTRAVENOUS

## 2012-11-06 MED ORDER — HYDROCODONE-ACETAMINOPHEN 5-325 MG PO TABS
1.0000 | ORAL_TABLET | Freq: Four times a day (QID) | ORAL | Status: AC | PRN
  Administered 2012-11-06: 1 via ORAL
  Filled 2012-11-06: qty 1

## 2012-11-06 MED ORDER — LIDOCAINE HCL (CARDIAC) 20 MG/ML IV SOLN
INTRAVENOUS | Status: DC | PRN
  Administered 2012-11-06: 40 mg via INTRAVENOUS

## 2012-11-06 MED ORDER — SODIUM CHLORIDE 0.9 % IR SOLN
Status: DC | PRN
  Administered 2012-11-06: 6000 mL

## 2012-11-06 MED ORDER — FENTANYL CITRATE 0.05 MG/ML IJ SOLN
25.0000 ug | INTRAMUSCULAR | Status: DC | PRN
  Filled 2012-11-06: qty 1

## 2012-11-06 MED ORDER — OXYBUTYNIN CHLORIDE 5 MG PO TABS
5.0000 mg | ORAL_TABLET | Freq: Three times a day (TID) | ORAL | Status: AC
  Administered 2012-11-06: 5 mg via ORAL
  Filled 2012-11-06: qty 1

## 2012-11-06 MED ORDER — NEOSTIGMINE METHYLSULFATE 1 MG/ML IJ SOLN
INTRAMUSCULAR | Status: DC | PRN
  Administered 2012-11-06: 2 mg via INTRAVENOUS

## 2012-11-06 MED ORDER — LACTATED RINGERS IV SOLN
INTRAVENOUS | Status: DC
  Administered 2012-11-06: 13:00:00 via INTRAVENOUS
  Filled 2012-11-06: qty 1000

## 2012-11-06 MED ORDER — LACTATED RINGERS IV SOLN
INTRAVENOUS | Status: DC
  Filled 2012-11-06: qty 1000

## 2012-11-06 MED ORDER — IOHEXOL 350 MG/ML SOLN
INTRAVENOUS | Status: DC | PRN
  Administered 2012-11-06: 10 mL

## 2012-11-06 SURGICAL SUPPLY — 36 items
ADAPTER CATH URET PLST 4-6FR (CATHETERS) IMPLANT
BAG DRAIN URO-CYSTO SKYTR STRL (DRAIN) ×3 IMPLANT
BASKET LASER NITINOL 1.9FR (BASKET) IMPLANT
BASKET SEGURA 3FR (UROLOGICAL SUPPLIES) IMPLANT
BASKET STNLS GEMINI 4WIRE 3FR (BASKET) IMPLANT
BASKET ZERO TIP NITINOL 2.4FR (BASKET) ×3 IMPLANT
BRUSH URET BIOPSY 3F (UROLOGICAL SUPPLIES) IMPLANT
CANISTER SUCT LVC 12 LTR MEDI- (MISCELLANEOUS) IMPLANT
CATH INTERMIT  6FR 70CM (CATHETERS) IMPLANT
CATH URET 5FR 28IN CONE TIP (BALLOONS)
CATH URET 5FR 28IN OPEN ENDED (CATHETERS) IMPLANT
CATH URET 5FR 70CM CONE TIP (BALLOONS) IMPLANT
CLOTH BEACON ORANGE TIMEOUT ST (SAFETY) ×3 IMPLANT
DRAPE CAMERA CLOSED 9X96 (DRAPES) ×3 IMPLANT
ELECT REM PT RETURN 9FT ADLT (ELECTROSURGICAL)
ELECTRODE REM PT RTRN 9FT ADLT (ELECTROSURGICAL) IMPLANT
GLOVE BIO SURGEON STRL SZ 6 (GLOVE) ×3 IMPLANT
GLOVE BIO SURGEON STRL SZ 6.5 (GLOVE) ×6 IMPLANT
GLOVE BIO SURGEON STRL SZ7 (GLOVE) ×3 IMPLANT
GOWN XL W/COTTON TOWEL STD (GOWNS) ×3 IMPLANT
GUIDEWIRE 0.038 PTFE COATED (WIRE) IMPLANT
GUIDEWIRE ANG ZIPWIRE 038X150 (WIRE) IMPLANT
GUIDEWIRE STR DUAL SENSOR (WIRE) IMPLANT
IV NS IRRIG 3000ML ARTHROMATIC (IV SOLUTION) ×6 IMPLANT
KIT BALLIN UROMAX 15FX10 (LABEL) IMPLANT
KIT BALLN UROMAX 15FX4 (MISCELLANEOUS) IMPLANT
KIT BALLN UROMAX 26 75X4 (MISCELLANEOUS)
LASER FIBER DISP (UROLOGICAL SUPPLIES) IMPLANT
LASER FIBER DISP 1000U (UROLOGICAL SUPPLIES) IMPLANT
PACK CYSTOSCOPY (CUSTOM PROCEDURE TRAY) ×3 IMPLANT
SET HIGH PRES BAL DIL (LABEL)
SHEATH ACCESS URETERAL 38CM (SHEATH) IMPLANT
SHEATH ACCESS URETERAL 54CM (SHEATH) IMPLANT
SHEATH URET ACCESS 12FR/35CM (UROLOGICAL SUPPLIES) IMPLANT
SHEATH URET ACCESS 12FR/55CM (UROLOGICAL SUPPLIES) IMPLANT
STENT URET 6FRX24 CONTOUR (STENTS) ×3 IMPLANT

## 2012-11-06 NOTE — Transfer of Care (Signed)
Immediate Anesthesia Transfer of Care Note  Patient: Lisa Freeman  Procedure(s) Performed: Procedure(s): CYSTOSCOPY WITH URETEROSCOPY, STONE MANIPULATION (Left) CYSTOSCOPY WITH RETROGRADE PYELOGRAM (Left) CYSTOSCOPY WITH STENT REMOVAL (Left) CYSTOSCOPY WITH STENT PLACEMENT (Left)  Patient Location: PACU  Anesthesia Type:General  Level of Consciousness: sedated  Airway & Oxygen Therapy: Patient Spontanous Breathing and Patient connected to nasal cannula oxygen  Post-op Assessment: Report given to PACU RN and Post -op Vital signs reviewed and stable  Post vital signs: stable  Complications: No apparent anesthesia complications

## 2012-11-06 NOTE — Anesthesia Preprocedure Evaluation (Signed)
Anesthesia Evaluation  Patient identified by MRN, date of birth, ID band Patient awake    Reviewed: Allergy & Precautions, H&P , NPO status , Patient's Chart, lab work & pertinent test results, reviewed documented beta blocker date and time   Airway Mallampati: II TM Distance: >3 FB Neck ROM: full    Dental  (+) Edentulous Upper and Edentulous Lower   Pulmonary neg pulmonary ROS,  breath sounds clear to auscultation  Pulmonary exam normal       Cardiovascular hypertension, Pt. on medications and Pt. on home beta blockers Rhythm:regular Rate:Normal     Neuro/Psych negative neurological ROS  negative psych ROS   GI/Hepatic negative GI ROS, Neg liver ROS,   Endo/Other  negative endocrine ROS  Renal/GU negative Renal ROS  negative genitourinary   Musculoskeletal   Abdominal   Peds  Hematology  (+) Blood dyscrasia, anemia , hgb 8.7   Anesthesia Other Findings   Reproductive/Obstetrics negative OB ROS                           Anesthesia Physical Anesthesia Plan  ASA: II  Anesthesia Plan: General   Post-op Pain Management:    Induction: Intravenous  Airway Management Planned: Oral ETT  Additional Equipment:   Intra-op Plan:   Post-operative Plan: Extubation in OR  Informed Consent: I have reviewed the patients History and Physical, chart, labs and discussed the procedure including the risks, benefits and alternatives for the proposed anesthesia with the patient or authorized representative who has indicated his/her understanding and acceptance.   Dental Advisory Given  Plan Discussed with: CRNA and Surgeon  Anesthesia Plan Comments:         Anesthesia Quick Evaluation

## 2012-11-06 NOTE — Op Note (Signed)
Lisa Freeman is a 79 y.o.   11/06/2012  General  Preop diagnosis: Left distal ureteral calculus  Postop diagnosis: Same  Procedure done: Cystoscopy, removal of double-J stent, ureteroscopy, stone extraction, left retrograde pyelogram, insertion of double-J stent  Surgeon: Wendie Simmer. Lisa Freeman  Anesthesia: General  Indication: Patient is a 79 years old female who had a double-J stent inserted on 10/25/2012 for left distal ureteral calculus and urosepsis. She complains of suprapubic pain frequency sensation of incomplete emptying of the bladder. She is scheduled today for cystoscopy and stone extraction  Procedure: The patient was identified by her wrist band and proper timeout was taken.  Under general anesthesia she was prepped and draped and placed in the dorsolithotomy position. A panendoscope was inserted in the bladder. The bladder mucosa is reddened. The distal curl of the double-J stent was in the bladder out of the left ureteral orifice. It was grasped with a grasping forceps and pulled out through the urethra a sensor wire was then passed through the double-J stent and the stent was removed. The cystoscope was removed. A semirigid ureteroscope was then passed in the ureter without difficulty. The stone was identified in the ureter. It was  caught within the wires of a 0 tip Nitinol basket and extracted. The ureteroscope was then reinserted in the ureter and there is no evidence of remaining stone  in the ureter.  Left retrograde pyelogram:  10 cc of Omnipaque were then injected through the ureteroscope. There is no evidence of filling defect in the ureter and there is no extravasation of contrast. The proximal ureter, renal pelvis and calyces are normal. The ureteroscope was then removed. There was some edema in the distal ureter. The sensor wire was then backloaded into the cystoscope and a #6 [place] last 24 double-J stent was passed over the sensor wire. A string was left attached to the  double-J stent for easy removal. The bladder was then emptied and the cystoscope and sensor wire were removed.  The patient tolerated the procedure well and left the OR in satisfactory condition to postanesthesia care unit  EBL: None

## 2012-11-06 NOTE — H&P (Signed)
History of Present Illness  Ms Qualley had left JJ stent insertion on 7/17 for left distal ureteral calculus and urosepsis.  She complains of suprapubic pain, frequency, sensation of incomplete bladder emptying, probably due to the stent.  She does not have any fever.  Will schedule her for stone manipulation.   Past Medical History Problems  1. History of  Arthritis V13.4 2. History of  Heartburn 787.1 3. History of  Hypercholesterolemia 272.0 4. History of  Hypertension 401.9 5. History of  Skin Cancer V10.83  Surgical History Problems  1. History of  Appendectomy 2. History of  Back Surgery 3. History of  Hip Surgery Left 4. History of  Hysterectomy V45.77  Current Meds 1. ALPRAZolam 0.25 MG Oral Tablet; Therapy: (Recorded:28Jul2014) to 2. Aspirin 81 MG Oral Tablet; Therapy: (Recorded:28Jul2014) to 3. Folic Acid CAPS; Therapy: (Recorded:28Jul2014) to 4. Hydrocodone-Acetaminophen 5-325 MG Oral Tablet; 1 or 2 every 4 to 6 hours prn pain; Therapy:  25Jul2014 to (Last Rx:25Jul2014) 5. Lisinopril 10 MG Oral Tablet; Therapy: (Recorded:28Jul2014) to 6. Metoprolol Tartrate 25 MG Oral Tablet; Therapy: (Recorded:28Jul2014) to 7. Myrbetriq 25 MG Oral Tablet Extended Release 24 Hour; Therapy: (Recorded:28Jul2014) to 8. Oxybutynin Chloride ER 5 MG Oral Tablet Extended Release 24 Hour; Therapy:  (Recorded:28Jul2014) to 9. Sulfamethoxazole-TMP DS TABS; Therapy: (Recorded:28Jul2014) to  Allergies Medication  1. Codeine Derivatives 2. Penicillins  Family History Problems  1. Family history of  Family Health Status Number Of Children 1 son/ 1 daughter 2. Family history of  Father Deceased At Age 65 Tuberculosis 3. Family history of  Mother Deceased At Age 57 4. Family history of  Nephrolithiasis  Social History Problems  1. Caffeine Use 4 2. Marital History - Widowed 3. Never A Smoker 4. Retired From Work Denied  5. History of  Alcohol Use  Review of Systems Genitourinary,  constitutional, skin, eye, otolaryngeal, hematologic/lymphatic, cardiovascular, pulmonary, endocrine, musculoskeletal, gastrointestinal, neurological and psychiatric system(s) were reviewed and pertinent findings if present are noted.  Gastrointestinal: nausea and diarrhea.    Vitals Vital Signs [Data Includes: Last 1 Day]  28Jul2014 03:47PM  BMI Calculated: 21.14 BSA Calculated: 1.48 Height: 5 ft 1 in Weight: 112 lb  Blood Pressure: 146 / 57 Temperature: 98.3 F Heart Rate: 56 Respiration: 18  Physical Exam Constitutional: Well nourished and well developed . No acute distress.  ENT:. The ears and nose are normal in appearance.  Neck: The appearance of the neck is normal and no neck mass is present.  Pulmonary: No respiratory distress and normal respiratory rhythm and effort.  Cardiovascular: Heart rate and rhythm are normal . No peripheral edema.  Abdomen: The abdomen is soft and nontender. No masses are palpated. No CVA tenderness. No hernias are palpable. No hepatosplenomegaly noted.  Genitourinary:  Chaperone Present: .  Examination of the external genitalia shows normal female external genitalia and no lesions. The urethra is normal in appearance and not tender. There is no urethral mass. Vaginal exam demonstrates no abnormalities. The adnexa are palpably normal. The bladder is non tender and not distended. The anus is normal on inspection. The perineum is normal on inspection.  Lymphatics: The femoral and inguinal nodes are not enlarged or tender.  Skin: Normal skin turgor, no visible rash and no visible skin lesions.  Neuro/Psych:. Mood and affect are appropriate.    Assessment Assessed  1. Distal Ureteral Stone On The Left 592.1  Plan Health Maintenance (V70.0)  1. UA With REFLEX  Done: 28Jul2014   Cystoscopy, ureteroscopy, holmium laser left  distal ureteral calculus, stone manipulation.  The procedure, risks, benefits were discussed with the patient and her daughter.  The  risks include but are not limited to hemorrhage, infection, ureteral injury.  They understand and wish to proceed.

## 2012-11-06 NOTE — Anesthesia Postprocedure Evaluation (Signed)
  Anesthesia Post-op Note  Patient: Lisa Freeman  Procedure(s) Performed: Procedure(s) (LRB): CYSTOSCOPY WITH URETEROSCOPY, STONE MANIPULATION (Left) CYSTOSCOPY WITH RETROGRADE PYELOGRAM (Left) CYSTOSCOPY WITH STENT REMOVAL (Left) CYSTOSCOPY WITH STENT PLACEMENT (Left)  Patient Location: PACU  Anesthesia Type: General  Level of Consciousness: awake and alert   Airway and Oxygen Therapy: Patient Spontanous Breathing  Post-op Pain: mild  Post-op Assessment: Post-op Vital signs reviewed, Patient's Cardiovascular Status Stable, Respiratory Function Stable, Patent Airway and No signs of Nausea or vomiting  Last Vitals:  Filed Vitals:   11/06/12 1415  BP: 137/41  Pulse: 49  Temp:   Resp: 14    Post-op Vital Signs: stable   Complications: No apparent anesthesia complications

## 2012-11-07 ENCOUNTER — Encounter (HOSPITAL_BASED_OUTPATIENT_CLINIC_OR_DEPARTMENT_OTHER): Payer: Self-pay | Admitting: Urology

## 2013-11-10 DIAGNOSIS — N390 Urinary tract infection, site not specified: Secondary | ICD-10-CM | POA: Insufficient documentation

## 2013-11-25 DIAGNOSIS — E785 Hyperlipidemia, unspecified: Secondary | ICD-10-CM

## 2013-11-25 DIAGNOSIS — G43909 Migraine, unspecified, not intractable, without status migrainosus: Secondary | ICD-10-CM | POA: Insufficient documentation

## 2013-11-25 DIAGNOSIS — E0590 Thyrotoxicosis, unspecified without thyrotoxic crisis or storm: Secondary | ICD-10-CM

## 2013-11-25 DIAGNOSIS — F411 Generalized anxiety disorder: Secondary | ICD-10-CM

## 2013-11-25 DIAGNOSIS — I4891 Unspecified atrial fibrillation: Secondary | ICD-10-CM

## 2013-11-25 DIAGNOSIS — I872 Venous insufficiency (chronic) (peripheral): Secondary | ICD-10-CM

## 2013-11-25 DIAGNOSIS — N390 Urinary tract infection, site not specified: Secondary | ICD-10-CM

## 2013-11-25 DIAGNOSIS — I669 Occlusion and stenosis of unspecified cerebral artery: Secondary | ICD-10-CM

## 2013-11-25 DIAGNOSIS — I6529 Occlusion and stenosis of unspecified carotid artery: Secondary | ICD-10-CM

## 2013-11-25 DIAGNOSIS — K5730 Diverticulosis of large intestine without perforation or abscess without bleeding: Secondary | ICD-10-CM

## 2013-11-25 DIAGNOSIS — L719 Rosacea, unspecified: Secondary | ICD-10-CM

## 2013-11-25 DIAGNOSIS — D649 Anemia, unspecified: Secondary | ICD-10-CM

## 2013-11-25 DIAGNOSIS — G4700 Insomnia, unspecified: Secondary | ICD-10-CM

## 2013-11-25 DIAGNOSIS — L301 Dyshidrosis [pompholyx]: Secondary | ICD-10-CM

## 2013-11-25 DIAGNOSIS — G43809 Other migraine, not intractable, without status migrainosus: Secondary | ICD-10-CM

## 2013-12-03 ENCOUNTER — Ambulatory Visit: Payer: Medicare Other | Admitting: Internal Medicine

## 2018-12-27 ENCOUNTER — Emergency Department (HOSPITAL_COMMUNITY): Payer: Medicare Other

## 2018-12-27 ENCOUNTER — Encounter (HOSPITAL_COMMUNITY): Payer: Self-pay

## 2018-12-27 ENCOUNTER — Other Ambulatory Visit: Payer: Self-pay

## 2018-12-27 ENCOUNTER — Inpatient Hospital Stay (HOSPITAL_COMMUNITY)
Admission: EM | Admit: 2018-12-27 | Discharge: 2018-12-29 | DRG: 392 | Disposition: A | Discharge status: Home Health | Payer: Medicare Other | Attending: Family Medicine | Admitting: Family Medicine

## 2018-12-27 DIAGNOSIS — Z20828 Contact with and (suspected) exposure to other viral communicable diseases: Secondary | ICD-10-CM | POA: Diagnosis present

## 2018-12-27 DIAGNOSIS — A09 Infectious gastroenteritis and colitis, unspecified: Principal | ICD-10-CM | POA: Diagnosis present

## 2018-12-27 DIAGNOSIS — Z885 Allergy status to narcotic agent status: Secondary | ICD-10-CM

## 2018-12-27 DIAGNOSIS — Z66 Do not resuscitate: Secondary | ICD-10-CM | POA: Diagnosis present

## 2018-12-27 DIAGNOSIS — K529 Noninfective gastroenteritis and colitis, unspecified: Secondary | ICD-10-CM | POA: Diagnosis present

## 2018-12-27 DIAGNOSIS — I10 Essential (primary) hypertension: Secondary | ICD-10-CM | POA: Diagnosis present

## 2018-12-27 DIAGNOSIS — F419 Anxiety disorder, unspecified: Secondary | ICD-10-CM | POA: Diagnosis present

## 2018-12-27 DIAGNOSIS — Z90710 Acquired absence of both cervix and uterus: Secondary | ICD-10-CM

## 2018-12-27 DIAGNOSIS — G4700 Insomnia, unspecified: Secondary | ICD-10-CM | POA: Diagnosis present

## 2018-12-27 DIAGNOSIS — Z79899 Other long term (current) drug therapy: Secondary | ICD-10-CM

## 2018-12-27 DIAGNOSIS — Z881 Allergy status to other antibiotic agents status: Secondary | ICD-10-CM

## 2018-12-27 DIAGNOSIS — Z880 Allergy status to penicillin: Secondary | ICD-10-CM

## 2018-12-27 DIAGNOSIS — Z7982 Long term (current) use of aspirin: Secondary | ICD-10-CM

## 2018-12-27 DIAGNOSIS — Z79891 Long term (current) use of opiate analgesic: Secondary | ICD-10-CM

## 2018-12-27 DIAGNOSIS — H9190 Unspecified hearing loss, unspecified ear: Secondary | ICD-10-CM | POA: Diagnosis present

## 2018-12-27 DIAGNOSIS — Z882 Allergy status to sulfonamides status: Secondary | ICD-10-CM

## 2018-12-27 DIAGNOSIS — Z888 Allergy status to other drugs, medicaments and biological substances status: Secondary | ICD-10-CM

## 2018-12-27 LAB — PROTIME-INR
INR: 1 (ref 0.8–1.2)
Prothrombin Time: 12.9 seconds (ref 11.4–15.2)

## 2018-12-27 LAB — LACTIC ACID, PLASMA: Lactic Acid, Venous: 1.1 mmol/L (ref 0.5–1.9)

## 2018-12-27 LAB — CBC WITH DIFFERENTIAL/PLATELET
Abs Immature Granulocytes: 0.06 10*3/uL (ref 0.00–0.07)
Basophils Absolute: 0.1 10*3/uL (ref 0.0–0.1)
Basophils Relative: 0 %
Eosinophils Absolute: 0 10*3/uL (ref 0.0–0.5)
Eosinophils Relative: 0 %
HCT: 33.2 % — ABNORMAL LOW (ref 36.0–46.0)
Hemoglobin: 10.3 g/dL — ABNORMAL LOW (ref 12.0–15.0)
Immature Granulocytes: 0 %
Lymphocytes Relative: 8 %
Lymphs Abs: 1.3 10*3/uL (ref 0.7–4.0)
MCH: 30.7 pg (ref 26.0–34.0)
MCHC: 31 g/dL (ref 30.0–36.0)
MCV: 99.1 fL (ref 80.0–100.0)
Monocytes Absolute: 0.6 10*3/uL (ref 0.1–1.0)
Monocytes Relative: 4 %
Neutro Abs: 14.8 10*3/uL — ABNORMAL HIGH (ref 1.7–7.7)
Neutrophils Relative %: 88 %
Platelets: 280 10*3/uL (ref 150–400)
RBC: 3.35 MIL/uL — ABNORMAL LOW (ref 3.87–5.11)
RDW: 13.9 % (ref 11.5–15.5)
WBC: 16.8 10*3/uL — ABNORMAL HIGH (ref 4.0–10.5)
nRBC: 0 % (ref 0.0–0.2)

## 2018-12-27 LAB — COMPREHENSIVE METABOLIC PANEL
ALT: 12 U/L (ref 0–44)
AST: 17 U/L (ref 15–41)
Albumin: 3.8 g/dL (ref 3.5–5.0)
Alkaline Phosphatase: 57 U/L (ref 38–126)
Anion gap: 5 (ref 5–15)
BUN: 22 mg/dL (ref 8–23)
CO2: 23 mmol/L (ref 22–32)
Calcium: 9.3 mg/dL (ref 8.9–10.3)
Chloride: 108 mmol/L (ref 98–111)
Creatinine, Ser: 1.11 mg/dL — ABNORMAL HIGH (ref 0.44–1.00)
GFR calc Af Amer: 48 mL/min — ABNORMAL LOW (ref >60)
GFR calc non Af Amer: 42 mL/min — ABNORMAL LOW (ref >60)
Glucose, Bld: 116 mg/dL — ABNORMAL HIGH (ref 70–99)
Potassium: 4 mmol/L (ref 3.5–5.1)
Sodium: 136 mmol/L (ref 135–145)
Total Bilirubin: 0.5 mg/dL (ref 0.3–1.2)
Total Protein: 6.9 g/dL (ref 6.5–8.1)

## 2018-12-27 LAB — URINALYSIS, ROUTINE W REFLEX MICROSCOPIC
Bilirubin Urine: NEGATIVE
Glucose, UA: NEGATIVE mg/dL
Hgb urine dipstick: NEGATIVE
Ketones, ur: NEGATIVE mg/dL
Leukocytes,Ua: NEGATIVE
Nitrite: NEGATIVE
Protein, ur: NEGATIVE mg/dL
Specific Gravity, Urine: 1.008 (ref 1.005–1.030)
pH: 6 (ref 5.0–8.0)

## 2018-12-27 LAB — LIPASE, BLOOD: Lipase: 46 U/L (ref 11–51)

## 2018-12-27 MED ORDER — LISINOPRIL 10 MG PO TABS
10.0000 mg | ORAL_TABLET | Freq: Every day | ORAL | Status: DC
  Administered 2018-12-28 – 2018-12-29 (×2): 10 mg via ORAL
  Filled 2018-12-27 (×2): qty 1

## 2018-12-27 MED ORDER — METRONIDAZOLE IN NACL 5-0.79 MG/ML-% IV SOLN
500.0000 mg | Freq: Once | INTRAVENOUS | Status: AC
  Administered 2018-12-28: 500 mg via INTRAVENOUS
  Filled 2018-12-27: qty 100

## 2018-12-27 MED ORDER — ENOXAPARIN SODIUM 40 MG/0.4ML ~~LOC~~ SOLN
40.0000 mg | Freq: Every day | SUBCUTANEOUS | Status: DC
  Administered 2018-12-28: 40 mg via SUBCUTANEOUS
  Filled 2018-12-27 (×2): qty 0.4

## 2018-12-27 MED ORDER — OLOPATADINE HCL 0.1 % OP SOLN
1.0000 [drp] | Freq: Two times a day (BID) | OPHTHALMIC | Status: DC
  Administered 2018-12-28 – 2018-12-29 (×3): 1 [drp] via OPHTHALMIC
  Filled 2018-12-27: qty 5

## 2018-12-27 MED ORDER — ONDANSETRON HCL 4 MG/2ML IJ SOLN: 4.0000 mg | Freq: Four times a day (QID) | INTRAMUSCULAR | Status: DC | PRN

## 2018-12-27 MED ORDER — ASPIRIN EC 81 MG PO TBEC
81.0000 mg | DELAYED_RELEASE_TABLET | Freq: Every day | ORAL | Status: DC
  Administered 2018-12-28 – 2018-12-29 (×2): 81 mg via ORAL
  Filled 2018-12-27 (×2): qty 1

## 2018-12-27 MED ORDER — ONDANSETRON HCL 4 MG PO TABS: 4.0000 mg | ORAL_TABLET | Freq: Four times a day (QID) | ORAL | Status: DC | PRN

## 2018-12-27 MED ORDER — METOPROLOL TARTRATE 25 MG PO TABS
12.5000 mg | ORAL_TABLET | Freq: Two times a day (BID) | ORAL | Status: DC
  Administered 2018-12-28 – 2018-12-29 (×4): 12.5 mg via ORAL
  Filled 2018-12-27 (×4): qty 1

## 2018-12-27 MED ORDER — ALPRAZOLAM 0.5 MG PO TABS
0.5000 mg | ORAL_TABLET | Freq: Every day | ORAL | Status: DC
  Administered 2018-12-28 (×2): 0.5 mg via ORAL
  Filled 2018-12-27 (×2): qty 1

## 2018-12-27 MED ORDER — SODIUM CHLORIDE 0.9 % IV SOLN
1.0000 g | Freq: Once | INTRAVENOUS | Status: AC
  Administered 2018-12-27: 1 g via INTRAVENOUS
  Filled 2018-12-27: qty 10

## 2018-12-27 MED ORDER — IOHEXOL 300 MG/ML  SOLN
75.0000 mL | Freq: Once | INTRAMUSCULAR | Status: AC | PRN
  Administered 2018-12-27: 75 mL via INTRAVENOUS

## 2018-12-27 MED ORDER — ACETAMINOPHEN 325 MG PO TABS
650.0000 mg | ORAL_TABLET | Freq: Four times a day (QID) | ORAL | Status: DC | PRN
  Administered 2018-12-28: 650 mg via ORAL
  Filled 2018-12-27: qty 2

## 2018-12-27 MED ORDER — SODIUM CHLORIDE 0.9 % IV SOLN
Freq: Once | INTRAVENOUS | Status: AC
  Administered 2018-12-27: 20:00:00 via INTRAVENOUS

## 2018-12-27 MED ORDER — AMLODIPINE BESYLATE 5 MG PO TABS
5.0000 mg | ORAL_TABLET | Freq: Every day | ORAL | Status: DC
  Administered 2018-12-28 – 2018-12-29 (×2): 5 mg via ORAL
  Filled 2018-12-27 (×2): qty 1

## 2018-12-27 MED ORDER — PREDNISOLONE ACETATE 1 % OP SUSP
1.0000 [drp] | OPHTHALMIC | Status: DC
  Administered 2018-12-28: 1 [drp] via OPHTHALMIC
  Filled 2018-12-27: qty 5

## 2018-12-27 MED ORDER — SODIUM CHLORIDE 0.9 % IV SOLN
1.0000 g | Freq: Once | INTRAVENOUS | Status: AC
  Administered 2018-12-28: 1 g via INTRAVENOUS
  Filled 2018-12-27: qty 1

## 2018-12-27 MED ORDER — FLUTICASONE PROPIONATE 0.05 % EX CREA: 1.0000 "application " | TOPICAL_CREAM | Freq: Two times a day (BID) | CUTANEOUS | Status: DC

## 2018-12-27 MED ORDER — SODIUM CHLORIDE 0.9 % IV SOLN
Freq: Once | INTRAVENOUS | Status: AC
  Administered 2018-12-28: 01:00:00 via INTRAVENOUS

## 2018-12-27 MED ORDER — METRONIDAZOLE IN NACL 5-0.79 MG/ML-% IV SOLN
500.0000 mg | Freq: Three times a day (TID) | INTRAVENOUS | Status: DC
  Administered 2018-12-28: 500 mg via INTRAVENOUS
  Filled 2018-12-27: qty 100

## 2018-12-27 MED ORDER — FAMOTIDINE IN NACL 20-0.9 MG/50ML-% IV SOLN
20.0000 mg | Freq: Once | INTRAVENOUS | Status: AC
  Administered 2018-12-27: 20 mg via INTRAVENOUS
  Filled 2018-12-27: qty 50

## 2018-12-27 MED ORDER — MORPHINE SULFATE (PF) 2 MG/ML IV SOLN
1.0000 mg | INTRAVENOUS | Status: DC | PRN
  Administered 2018-12-28: 1 mg via INTRAVENOUS
  Filled 2018-12-27: qty 1

## 2018-12-27 MED ORDER — ACETAMINOPHEN 650 MG RE SUPP: 650.0000 mg | Freq: Four times a day (QID) | RECTAL | Status: DC | PRN

## 2018-12-27 MED ORDER — IOHEXOL 300 MG/ML  SOLN
30.0000 mL | Freq: Once | INTRAMUSCULAR | Status: AC | PRN
  Administered 2018-12-27: 30 mL via ORAL

## 2018-12-27 MED ORDER — FOLIC ACID 1 MG PO TABS
1.0000 mg | ORAL_TABLET | Freq: Every day | ORAL | Status: DC
  Administered 2018-12-28 – 2018-12-29 (×2): 1 mg via ORAL
  Filled 2018-12-27 (×2): qty 1

## 2018-12-27 NOTE — ED Triage Notes (Signed)
Pt arrives [facility] from home for abdominal pain and decrease in appetite.

## 2018-12-27 NOTE — ED Notes (Addendum)
Provider made aware each urine sample obtained has been contaminated with bowel, orders to in and out cath given

## 2018-12-27 NOTE — ED Triage Notes (Signed)
Arrived by [facility] from home with c/o lower left and right abdominal pain that started this morning. Patient reports decreased appetite, small BM today and yesterday> EMS reports family gave patient Pepto and Xanax. A&O X4, NAD.

## 2018-12-27 NOTE — H&P (Signed)
History and Physical    Lisa [facility] RCV:[id number] DOB: [birth year] DOA: 12/27/2018  PCP: Jonathon Bellows, PA-C  Patient coming from: Home  I have personally briefly reviewed patient's old medical records in [place]  Chief Complaint: Abd pain  HPI: Lisa [facility] is a 85 y.o. female with medical history significant of HTN.  Patient presents to the ED with c/o abd pain.  Pain onset earlier today.  Located in central abdomen.  Has had nl BMs today and this evening (though RNs are reporting diarrhea).  No dysuria, no flank pain.  Were concerned it might be constipation, but after multiple BMs, are fairly convinced that it isnt.   ED Course: WBC 16k.  No other SIRS.  CT abd/pelvis shows uncomplicated colitis.  Infectious vs inflammatory.  Diverticulitis possible but less likely.  No perforation, no abscess.  UA negative.   Review of Systems: As per HPI, otherwise all review of systems negative.  Past Medical History:  Diagnosis Date  . Hypertension     Past Surgical History:  Procedure Laterality Date  . ABDOMINAL HYSTERECTOMY    . APPENDECTOMY    . BACK SURGERY    . CYSTOSCOPY W/ RETROGRADES Left 11/06/2012   Procedure: CYSTOSCOPY WITH RETROGRADE PYELOGRAM;  Surgeon: Hanley Ben, MD;  Location: [place];  Service: Urology;  Laterality: Left;  . CYSTOSCOPY W/ URETERAL STENT REMOVAL Left 11/06/2012   Procedure: CYSTOSCOPY WITH STENT REMOVAL;  Surgeon: Hanley Ben, MD;  Location: [place];  Service: Urology;  Laterality: Left;  . CYSTOSCOPY WITH RETROGRADE PYELOGRAM, URETEROSCOPY AND STENT PLACEMENT Left 10/25/2012   Procedure: CYSTOSCOPY/LEFT RETROGRADE PYELOGRAM/PLACEMENT LEFT URETERAL STENT;  Surgeon: Hanley Ben, MD;  Location: [facility];  Service: Urology;  Laterality: Left;  CYSTOSCOPY/LEFT RETROGRADE PYELOGRAM/URETEROSCOPY/PLACEMENT URETERAL STENT  . CYSTOSCOPY WITH STENT PLACEMENT Left 11/06/2012   Procedure: CYSTOSCOPY WITH STENT PLACEMENT;  Surgeon: Hanley Ben, MD;  Location: [place];  Service: Urology;  Laterality: Left;  . CYSTOSCOPY WITH URETEROSCOPY AND STENT PLACEMENT Left 11/06/2012   Procedure: CYSTOSCOPY WITH URETEROSCOPY, STONE MANIPULATION;  Surgeon: Hanley Ben, MD;  Location: [place];  Service: Urology;  Laterality: Left;  . HIP FRACTURE SURGERY Left      reports that she has never smoked. She has never used smokeless tobacco. She reports that she does not drink alcohol or use drugs.  Allergies  Allergen Reactions  . Penicillins Shortness Of Breath  . Codeine Nausea And Vomiting  . Doxycycline   . Fluticasone Propionate   . Moxifloxacin   . Nitrofurantoin Monohyd Macro   . Promethazine   . Sulfa Antibiotics   . Trimethoprim     No family history on file. No sick contacts.  Prior to Admission medications   Medication Sig Start Date End Date Taking? Authorizing Provider  ALPRAZolam Duanne Moron) 0.25 MG tablet Take 0.5 mg by mouth at bedtime.    Yes [provider]  amLODipine (NORVASC) 5 MG tablet Take 5 mg by mouth daily.   Yes [provider]  aspirin EC 81 MG tablet Take 81 mg by mouth daily.   Yes [provider]  fluticasone (CUTIVATE) 0.05 % cream Apply 1 application topically 2 (two) times daily.   Yes [provider]  folic acid (FOLVITE) 1 MG tablet Take 1 mg by mouth daily.   Yes [provider]  lisinopril (PRINIVIL,ZESTRIL) 10 MG tablet Take 10 mg by mouth daily.   Yes [provider]  metoprolol tartrate (  LOPRESSOR) 25 MG tablet Take 12.5 mg by mouth 2 (two) times daily. Take 1/2 tablet (12.5mg ) twice daily   Yes [provider]  Olopatadine HCl (PATADAY) 0.2 % SOLN Apply 1 drop to eye as directed.   Yes [provider]  prednisoLONE acetate (PRED FORTE) 1 % ophthalmic suspension Place 1 drop into both eyes 3 (three) times a week.   Yes  [provider]    Physical Exam: Vitals:   12/27/18 2200 12/27/18 2230 12/27/18 2300 12/27/18 2330  BP: 131/88 (!) 138/55 (!) 127/91 (!) 126/46  Pulse: 79 74 72 (!) 59  Resp: (!) 22 16 (!) 28 15  Temp:      TempSrc:      SpO2: 99% 99% 98% 98%    Constitutional: NAD, calm, comfortable Eyes: PERRL, lids and conjunctivae normal ENMT: Mucous membranes are moist. Posterior pharynx clear of any exudate or lesions.Normal dentition. Hard of hearing Neck: normal, supple, no masses, no thyromegaly Respiratory: clear to auscultation bilaterally, no wheezing, no crackles. Normal respiratory effort. No accessory muscle use.  Cardiovascular: Regular rate and rhythm, no murmurs / rubs / gallops. No extremity edema. 2+ pedal pulses. No carotid bruits.  Abdomen: Diffuse TTP, no rebound Musculoskeletal: no clubbing / cyanosis. No joint deformity upper and lower extremities. Good ROM, no contractures. Normal muscle tone.  Skin: no rashes, lesions, ulcers. No induration Neurologic: CN 2-12 grossly intact. Sensation intact, DTR normal. Strength 5/5 in all 4.  Psychiatric: Normal judgment and insight. Alert and oriented x 3. Normal mood.    Labs on Admission: I have personally reviewed following labs and imaging studies  CBC: Recent Labs  Lab 12/27/18 1943  WBC 16.8*  NEUTROABS 14.8*  HGB 10.3*  HCT 33.2*  MCV 99.1  PLT 280   Basic Metabolic Panel: Recent Labs  Lab 12/27/18 1943  NA 136  K 4.0  CL 108  CO2 23  GLUCOSE 116*  BUN 22  CREATININE 1.11*  CALCIUM 9.3   GFR: CrCl cannot be calculated (Unknown ideal weight.). Liver Function Tests: Recent Labs  Lab 12/27/18 1943  AST 17  ALT 12  ALKPHOS 57  BILITOT 0.5  PROT 6.9  ALBUMIN 3.8   Recent Labs  Lab 12/27/18 1943  LIPASE 46   No results for input(s): AMMONIA in the last 168 hours. Coagulation Profile: Recent Labs  Lab 12/27/18 1943  INR 1.0   Cardiac Enzymes: No results for input(s): CKTOTAL, CKMB,  CKMBINDEX, TROPONINI in the last 168 hours. BNP (last 3 results) No results for input(s): PROBNP in the last 8760 hours. HbA1C: No results for input(s): HGBA1C in the last 72 hours. CBG: No results for input(s): GLUCAP in the last 168 hours. Lipid Profile: No results for input(s): CHOL, HDL, LDLCALC, TRIG, CHOLHDL, LDLDIRECT in the last 72 hours. Thyroid Function Tests: No results for input(s): TSH, T4TOTAL, FREET4, T3FREE, THYROIDAB in the last 72 hours. Anemia Panel: No results for input(s): VITAMINB12, FOLATE, FERRITIN, TIBC, IRON, RETICCTPCT in the last 72 hours. Urine analysis:    Component Value Date/Time   COLORURINE STRAW (A) 12/27/2018 2222   APPEARANCEUR CLEAR 12/27/2018 2222   LABSPEC 1.008 12/27/2018 2222   PHURINE 6.0 12/27/2018 2222   GLUCOSEU NEGATIVE 12/27/2018 2222   HGBUR NEGATIVE 12/27/2018 2222   BILIRUBINUR NEGATIVE 12/27/2018 2222   KETONESUR NEGATIVE 12/27/2018 2222   PROTEINUR NEGATIVE 12/27/2018 2222   UROBILINOGEN 0.2 10/25/2012 1646   NITRITE NEGATIVE 12/27/2018 2222   LEUKOCYTESUR NEGATIVE 12/27/2018 2222  Radiological Exams on Admission: Ct Abdomen Pelvis W Contrast  Result Date: 12/27/2018 CLINICAL DATA:  Generalized abdominal pain, left and right lower quadrant EXAM: CT ABDOMEN AND PELVIS WITH CONTRAST TECHNIQUE: Multidetector CT imaging of the abdomen and pelvis was performed using the standard protocol following bolus administration of intravenous contrast. CONTRAST:  51mL OMNIPAQUE IOHEXOL 300 MG/ML SOLN, 23mL OMNIPAQUE IOHEXOL 300 MG/ML SOLN COMPARISON:  CT abdomen pelvis 12/22/2015 FINDINGS: Lower chest: Bibasilar atelectatic changes. Lung bases are otherwise clear. Heart is borderline enlarged. Atherosclerotic calcification of the coronary arteries. Hepatobiliary: No focal liver abnormality is seen. No gallstones, gallbladder wall thickening, or biliary dilatation. Pancreas: Mild pancreatic atrophy. No pancreatic ductal dilatation or surrounding  inflammatory changes. Spleen: Normal in size without focal abnormality. Adrenals/Urinary Tract: Normal adrenal glands. Modest distention of the bilateral extrarenal pelves with mild urothelial thickening. Bladder is slightly distended. No CT evident urolithiasis. No concerning renal lesions. Stomach/Bowel: Moderate hiatal hernia. Enteric contrast medium traverses to the level of the sigmoid colon. No small bowel dilatation or wall thickening. No evidence of obstruction. Normal appendix in the right lower quadrant. There is segmental thickening of the sigmoid colon and rectum with adjacent pericolonic inflammatory features. Others are scattered colonic diverticula. The inflammation does not appear centered upon a focal culprit diverticulum. No extraluminal gas. No organized abscess or collection. Vascular/Lymphatic: Atherosclerotic plaque within the normal caliber aorta. Retroaortic left renal vein. No other significant vascular findings. Reactive adenopathy is noted in the low abdomen. No pathologically enlarged nodes. Reproductive: Uterus is surgically absent. No concerning adnexal lesions. Other: Reactive free fluid in the pelvis. No abdominopelvic free air. No organized collection or abscess. Mild body wall edema. Posterior injection granulomata. Musculoskeletal: Left femoral neck is transfixed by 3 partially threaded cannulated screws. Mild resulting streak artifact. There is dextrocurvature of the spine centered at L2. Multilevel discogenic and facet degenerative changes are present with additional posterior facet arthropathy resulting in mild to moderate multilevel foraminal narrowing and moderate to severe stenosis of the spinal canal L3-S1. IMPRESSION: 1. Segmental thickening of the sigmoid colon and rectum with adjacent pericolonic inflammatory features, consistent with colitis, either infectious or inflammatory in etiology. Scattered colonic diverticula are present though a focal diverticulitis is less  favored. No evidence of perforation or abscess formation. 2. Modest distention of the bilateral extrarenal pelves with mild urothelial thickening, nonspecific, but can be seen with urinary tract infection. Correlate with urinalysis. 3. Extensive moderate to severe degenerative changes of the spine, detailed above. Multilevel foraminal narrowing and spinal canal stenosis is maximal from L3-S1. 4. Aortic Atherosclerosis (ICD10-I70.0). Electronically Signed   By: Kreg Shropshire M.D.   On: 12/27/2018 22:25   Dg Chest Port 1 View  Result Date: 12/27/2018 CLINICAL DATA:  Epigastric pain EXAM: PORTABLE CHEST 1 VIEW COMPARISON:  11/18/2016 FINDINGS: No focal opacity or pleural effusion. Mild cardiomegaly with aortic atherosclerosis. Calcified left hilar nodes. No pneumothorax. Retrocardiac lucency, presumably moderate hiatal hernia IMPRESSION: 1. Mild cardiomegaly without acute airspace disease 2. Moderate hiatal hernia suspected 3. Prior granulomatous disease Electronically Signed   By: Jasmine Pang M.D.   On: 12/27/2018 20:13    EKG: Independently reviewed.  Assessment/Plan Principal Problem:   Colitis presumed infectious Active Problems:   Hypertension    1. Colitis - 1. Rocephin and flagyl - She tolerated multiple days of rocephin without problem (per daughter) back in June of this year (UTI treatment, Dr. Lindley Magnus office notes care everywhere). 2. IVF: 1L at 125 cc/hr then stop 3. Tiny dose of  morphine if needed but extreme caution given patients age 27. HTN - 1. Continue home BP meds  DVT prophylaxis: Lovenox Code Status: DNR - confirmed with daughter Family Communication: Spoke with Daughter over phone Disposition Plan: Home after admit Consults called: None Admission status: Place in obs    Krystofer Hevener M. DO [facility]  How to contact the [facility] Attending or Consulting provider 7A - 7P or covering provider during after hours 7P -7A, for this patient?  1. Check the care team in  [facility] and look for a) attending/consulting [facility] provider listed and b) the [facility] team listed 2. Log into [email or web address]  Amion Physician Scheduling and messaging for groups and whole hospitals  On call and physician scheduling software for group practices, residents, hospitalists and other medical providers for call, clinic, rotation and shift schedules. OnCall Enterprise is a hospital-wide system for scheduling doctors and paging doctors on call. EasyPlot is for scientific plotting and data analysis.  [email or web address]  and use [facility]'s universal password to access. If you do not have the password, please contact the hospital operator.  3. Locate the [facility] provider you are looking for under [facility] and page to a number that you can be directly reached. 4. If you still have difficulty reaching the provider, please page the [facility] (Director on Call) for the Hospitalists listed on amion for assistance.  12/27/2018, 11:46 PM

## 2018-12-27 NOTE — ED Notes (Signed)
Pt personal clothing (which was wet due to weather and coming in via ems) was removed and pt was put in a hospital gown. Pt placed on cardiac monitor and v/s cycling every 5min. Pt has call light within reach and was given two warm blankets.

## 2018-12-27 NOTE — ED Provider Notes (Signed)
[facility] DEPT Provider Note   CSN: [id number] Arrival date & time: 12/27/18  1814     History   Chief Complaint Chief Complaint  Patient presents with  . Abdominal Pain    LLQ and RLQ    HPI Lisa Freeman is a 85 y.o. female.     HPI Patient reports he started getting abdominal pain today.  She reports it just are hurting a lot and she indicates most of her central abdomen.  She reports there is nothing she can do to make it feel better.  She did not develop any vomiting.  She reports she has had a normal bowel movement but still thought maybe she was constipated.  She denies pain or burning with urination.  Nurses however report that she is now has several episodes of diarrhea and some frequent urination.  I did review the case with the patient's daughter.  Her daughter reports that symptoms really just started today with pain.  They had tried to give her some coffee thinking maybe it was constipation but that just made it worse and ultimately she needed to come to the hospital. Past Medical History:  Diagnosis Date  . Hypertension     Patient Active Problem List   Diagnosis Date Noted  . Anemia   . Chronic anemia   . Insomnia   . Subclinical hyperthyroidism   . Anxiety state, unspecified   . Atrial fibrillation ([place])   . Carotid stenosis   . Cerebral artery occlusion   . Diverticulosis of colon   . Dyshidrosis   . Other and unspecified hyperlipidemia   . Migraine   . Rosacea   . Unspecified venous (peripheral) insufficiency   . Urinary tract infection, site not specified 11/10/2013  . Urinary tract obstruction due to kidney stone 10/25/2012  . Hypertension     Past Surgical History:  Procedure Laterality Date  . ABDOMINAL HYSTERECTOMY    . APPENDECTOMY    . BACK SURGERY    . CYSTOSCOPY W/ RETROGRADES Left 11/06/2012   Procedure: CYSTOSCOPY WITH RETROGRADE PYELOGRAM;  Surgeon: Hanley Ben, MD;  Location: [place];  Service: Urology;  Laterality: Left;  . CYSTOSCOPY W/ URETERAL STENT REMOVAL Left 11/06/2012   Procedure: CYSTOSCOPY WITH STENT REMOVAL;  Surgeon: Hanley Ben, MD;  Location: [place];  Service: Urology;  Laterality: Left;  . CYSTOSCOPY WITH RETROGRADE PYELOGRAM, URETEROSCOPY AND STENT PLACEMENT Left 10/25/2012   Procedure: CYSTOSCOPY/LEFT RETROGRADE PYELOGRAM/PLACEMENT LEFT URETERAL STENT;  Surgeon: Hanley Ben, MD;  Location[facility];  Service: Urology;  Laterality: Left;  CYSTOSCOPY/LEFT RETROGRADE PYELOGRAM/URETEROSCOPY/PLACEMENT URETERAL STENT  . CYSTOSCOPY WITH STENT PLACEMENT Left 11/06/2012   Procedure: CYSTOSCOPY WITH STENT PLACEMENT;  Surgeon: Hanley Ben, MD;  Location: [place];  Service: Urology;  Laterality: Left;  . CYSTOSCOPY WITH URETEROSCOPY AND STENT PLACEMENT Left 11/06/2012   Procedure: CYSTOSCOPY WITH URETEROSCOPY, STONE MANIPULATION;  Surgeon: Hanley Ben, MD;  Location: [place];  Service: Urology;  Laterality: Left;  . HIP FRACTURE SURGERY Left      OB History   No obstetric history on file.      Home Medications    Prior to Admission medications   Medication Sig Start Date End Date Taking? Authorizing Provider  ALPRAZolam Duanne Moron) 0.25 MG tablet Take 0.5 mg by mouth at bedtime.     [provider]  amLODipine (NORVASC) 5 MG tablet Take 5 mg by mouth daily.    [provider]  aspirin EC 81 MG tablet Take 81 mg by mouth daily.    [provider]  fluticasone (CUTIVATE) 0.05 % cream Apply 1 application topically 2 (two) times daily.    [provider]  folic acid (FOLVITE) 1 MG tablet Take 1 mg by mouth daily.    [provider]  HYDROcodone-acetaminophen (NORCO/VICODIN) 5-325 MG per tablet Take 1 tablet by mouth every 4 (four) hours as needed for pain. 10/28/12   Meredeth IdeLama, Gagan S, MD  lisinopril (PRINIVIL,ZESTRIL) 10 MG tablet Take 10 mg by mouth  daily.    [provider]  metoprolol tartrate (LOPRESSOR) 25 MG tablet Take 12.5 mg by mouth 2 (two) times daily. Take 1/2 tablet (12.5mg ) twice daily    [provider]  Olopatadine HCl (PATADAY) 0.2 % SOLN Apply 1 drop to eye as directed.    [provider]  oxybutynin (DITROPAN) 5 MG tablet Take 1 tablet (5 mg total) by mouth every 8 (eight) hours as needed. 10/28/12   Meredeth IdeLama, Gagan S, MD  prednisoLONE acetate (PRED FORTE) 1 % ophthalmic suspension Place 1 drop into both eyes 3 (three) times a week.    [provider]  psyllium (METAMUCIL SMOOTH TEXTURE) 28 % packet Take 1 packet by mouth daily.    [provider]  sulfamethoxazole-trimethoprim (BACTRIM DS,SEPTRA DS) 800-160 MG per tablet Take 1 tablet by mouth 2 (two) times daily. 10/28/12   Meredeth IdeLama, Gagan S, MD  traMADol (ULTRAM) 50 MG tablet Take 50 mg by mouth every 6 (six) hours as needed for pain.    [provider]    Family History No family history on file.  Social History Social History   Tobacco Use  . Smoking status: Never Smoker  . Smokeless tobacco: Never Used  Substance Use Topics  . Alcohol use: No  . Drug use: No     Allergies   Penicillins, Codeine, Doxycycline, Fluticasone propionate, Moxifloxacin, Nitrofurantoin monohyd macro, Promethazine, Sulfa antibiotics, and Trimethoprim   Review of Systems Review of Systems 10 Systems reviewed and are negative for acute change except as noted in the HPI.   Physical Exam Updated Vital Signs BP (!) 151/54 (BP Location: Right Arm)   Pulse (!) 54   Temp 97.9 F (36.6 C) (Oral)   Resp 15   SpO2 99%   Physical Exam Constitutional:      Comments: Patient is alert and nontoxic.  She is answering questions appropriate.  No respiratory distress.  Excellent condition for age.  HENT:     Head: Normocephalic and atraumatic.  Eyes:     Extraocular Movements: Extraocular movements intact.  Neck:     Musculoskeletal: Neck  supple.  Cardiovascular:     Rate and Rhythm: Normal rate and regular rhythm.  Pulmonary:     Effort: Pulmonary effort is normal.     Breath sounds: Normal breath sounds.  Abdominal:     Comments: Is soft but patient has moderate pain throughout the central abdomen.  No guarding.  Musculoskeletal: Normal range of motion.        General: No swelling or tenderness.     Right lower leg: No edema.     Left lower leg: No edema.  Skin:    General: Skin is warm and dry.  Neurological:     General: No focal deficit present.     Mental Status: She is oriented to person, place, and time.     Coordination: Coordination normal.  Psychiatric:  Mood and Affect: Mood normal.      ED Treatments / Results  Labs (all labs ordered are listed, but only abnormal results are displayed) Labs Reviewed - No data to display  EKG None  Radiology No results found.  Procedures Procedures (including critical care time)  Medications Ordered in ED Medications - No data to display   Initial Impression / Assessment and Plan / ED Course  I have reviewed the triage vital signs and the nursing notes.  Pertinent labs & imaging results that were available during my care of the patient were reviewed by me and considered in my medical decision making (see chart for details).  Clinical Course as of Dec 27 1955  Thu Dec 27, 2018  6433 I called the patient's daughter Mrs. Hennis.  We discussed the patient's symptoms today.  She reports she was complaining of a lot of upper abdominal pain and initially thought it might be constipation.  They tried giving her some coffee but that just made the pain worse.  She reports that the patient was insistent on coming to the hospital so they did have her transported.  Her daughter denies that she has frequent episodes of similar pain.  She has been well leading up to this.   [MP]    Clinical Course User Index [MP] Arby Barrette, MD      CT scan has  identified colitis with some inflammatory change.  Patient does have leukocytosis.  Findings are consistent with her presentation today.  Her mental status and vital signs are stable.  Will start her on Rocephin and Flagyl combination.  Patient will be admitted to hospitalist service.  Final Clinical Impressions(s) / ED Diagnoses   Final diagnoses:  Colitis    ED Discharge Orders    None       Arby Barrette, MD 12/27/18 2308

## 2018-12-28 ENCOUNTER — Encounter (HOSPITAL_COMMUNITY): Payer: Self-pay

## 2018-12-28 DIAGNOSIS — Z90710 Acquired absence of both cervix and uterus: Secondary | ICD-10-CM | POA: Diagnosis not present

## 2018-12-28 DIAGNOSIS — Z20828 Contact with and (suspected) exposure to other viral communicable diseases: Secondary | ICD-10-CM | POA: Diagnosis present

## 2018-12-28 DIAGNOSIS — Z79899 Other long term (current) drug therapy: Secondary | ICD-10-CM | POA: Diagnosis not present

## 2018-12-28 DIAGNOSIS — Z79891 Long term (current) use of opiate analgesic: Secondary | ICD-10-CM | POA: Diagnosis not present

## 2018-12-28 DIAGNOSIS — Z880 Allergy status to penicillin: Secondary | ICD-10-CM | POA: Diagnosis not present

## 2018-12-28 DIAGNOSIS — H9190 Unspecified hearing loss, unspecified ear: Secondary | ICD-10-CM | POA: Diagnosis present

## 2018-12-28 DIAGNOSIS — Z66 Do not resuscitate: Secondary | ICD-10-CM | POA: Diagnosis present

## 2018-12-28 DIAGNOSIS — Z7982 Long term (current) use of aspirin: Secondary | ICD-10-CM | POA: Diagnosis not present

## 2018-12-28 DIAGNOSIS — I10 Essential (primary) hypertension: Secondary | ICD-10-CM | POA: Diagnosis present

## 2018-12-28 DIAGNOSIS — Z882 Allergy status to sulfonamides status: Secondary | ICD-10-CM | POA: Diagnosis not present

## 2018-12-28 DIAGNOSIS — K529 Noninfective gastroenteritis and colitis, unspecified: Secondary | ICD-10-CM | POA: Diagnosis present

## 2018-12-28 DIAGNOSIS — A09 Infectious gastroenteritis and colitis, unspecified: Secondary | ICD-10-CM | POA: Diagnosis present

## 2018-12-28 DIAGNOSIS — F419 Anxiety disorder, unspecified: Secondary | ICD-10-CM

## 2018-12-28 DIAGNOSIS — Z881 Allergy status to other antibiotic agents status: Secondary | ICD-10-CM | POA: Diagnosis not present

## 2018-12-28 DIAGNOSIS — Z888 Allergy status to other drugs, medicaments and biological substances status: Secondary | ICD-10-CM | POA: Diagnosis not present

## 2018-12-28 DIAGNOSIS — Z885 Allergy status to narcotic agent status: Secondary | ICD-10-CM | POA: Diagnosis not present

## 2018-12-28 DIAGNOSIS — G4700 Insomnia, unspecified: Secondary | ICD-10-CM | POA: Diagnosis present

## 2018-12-28 LAB — CBC
HCT: 28.4 % — ABNORMAL LOW (ref 36.0–46.0)
Hemoglobin: 8.8 g/dL — ABNORMAL LOW (ref 12.0–15.0)
MCH: 31.2 pg (ref 26.0–34.0)
MCHC: 31 g/dL (ref 30.0–36.0)
MCV: 100.7 fL — ABNORMAL HIGH (ref 80.0–100.0)
Platelets: 219 10*3/uL (ref 150–400)
RBC: 2.82 MIL/uL — ABNORMAL LOW (ref 3.87–5.11)
RDW: 13.7 % (ref 11.5–15.5)
WBC: 14.2 10*3/uL — ABNORMAL HIGH (ref 4.0–10.5)
nRBC: 0 % (ref 0.0–0.2)

## 2018-12-28 LAB — BASIC METABOLIC PANEL
Anion gap: 6 (ref 5–15)
BUN: 16 mg/dL (ref 8–23)
CO2: 20 mmol/L — ABNORMAL LOW (ref 22–32)
Calcium: 8 mg/dL — ABNORMAL LOW (ref 8.9–10.3)
Chloride: 114 mmol/L — ABNORMAL HIGH (ref 98–111)
Creatinine, Ser: 0.77 mg/dL (ref 0.44–1.00)
GFR calc Af Amer: >60 mL/min (ref >60)
GFR calc non Af Amer: >60 mL/min (ref >60)
Glucose, Bld: 131 mg/dL — ABNORMAL HIGH (ref 70–99)
Potassium: 3.9 mmol/L (ref 3.5–5.1)
Sodium: 140 mmol/L (ref 135–145)

## 2018-12-28 LAB — SARS CORONAVIRUS 2 (TAT 6-24 HRS): SARS Coronavirus 2: NEGATIVE

## 2018-12-28 MED ORDER — METRONIDAZOLE 500 MG PO TABS
500.0000 mg | ORAL_TABLET | Freq: Three times a day (TID) | ORAL | Status: DC
  Administered 2018-12-28 – 2018-12-29 (×4): 500 mg via ORAL
  Filled 2018-12-28 (×4): qty 1

## 2018-12-28 MED ORDER — CEFDINIR 300 MG PO CAPS
300.0000 mg | ORAL_CAPSULE | Freq: Every day | ORAL | Status: DC
  Administered 2018-12-29: 300 mg via ORAL
  Filled 2018-12-28: qty 1

## 2018-12-28 MED ORDER — ENOXAPARIN SODIUM 30 MG/0.3ML ~~LOC~~ SOLN
30.0000 mg | Freq: Every day | SUBCUTANEOUS | Status: DC
  Filled 2018-12-28: qty 0.3

## 2018-12-28 MED ORDER — SODIUM CHLORIDE 0.9 % IV SOLN
2.0000 g | INTRAVENOUS | Status: DC
  Filled 2018-12-28: qty 20

## 2018-12-28 MED ORDER — BOOST / RESOURCE BREEZE PO LIQD CUSTOM
1.0000 | Freq: Three times a day (TID) | ORAL | Status: DC
  Administered 2018-12-29: 1 via ORAL

## 2018-12-28 NOTE — Progress Notes (Signed)
PHARMACY NOTE:  ANTIMICROBIAL DOSAGE ADJUSTMENT  Current antimicrobial regimen includes a mismatch between antimicrobial dosage and indication.  As per policy approved by the Pharmacy & Therapeutics and Medical Executive Committees, the antimicrobial dosage will be adjusted accordingly.  Current antimicrobial dosage:  Rocephin 1 Gm IV q24h  Indication: IAI  Renal Function:  Estimated Creatinine Clearance: 19.8 mL/min (A) (by C-G formula based on SCr of 1.11 mg/dL (H)). []      On intermittent HD, scheduled: []      On CRRT    Antimicrobial dosage has been changed to:  Rocephin 2 Gm IV q24h   Thank you for allowing pharmacy to be a part of this patient's care.  [name], [facility] 12/28/2018 4:18 AM

## 2018-12-28 NOTE — Progress Notes (Signed)
PROGRESS NOTE    Lisa BinetSavannah Honor  ZOX:[id number] DOB: 1919-01-06 DOA: 12/27/2018 PCP: Bailey MechPodraza, Cole Christopher, PA-C   Brief Narrative: Lisa Freeman is a 85 y.o. female with a history of hypertension. She presented secondary to abdominal pain and found to have evidence of colitis, possibly infectious.   Assessment & Plan:   Principal Problem:   Colitis presumed infectious Active Problems:   Hypertension   Colitis Possibly infection. No fevers but elevated WBC. Concerns about oral antibiotic regimen from family secondary to history of antibiotic intolerance. WBC is trended down from yesterday. Abdominal pain is mild today -Will transition to Cefdinir and Flagyl PO in anticipation of discharge tomorrow to ensure patient can tolerate an oral regimen. If she cannot, she will likely need to continue treatment via IV antibiotics -Will obtain nutrition consult as well  Essential hypertension -Continue amlodipine and lisinopril  Anxiety -Continue Xanax   DVT prophylaxis: Lovenox Code Status:   Code Status: DNR Family Communication: Daughter on telephone Disposition Plan: Discharge likely in 24 hours if tolerates oral regimen. PT eval pending   Consultants:   None  Procedures:   None  Antimicrobials:  Ceftriaxone  Flagyl  Cefdinir    Subjective: Some abdominal tenderness. Wants to urinate. No other concerns.  Objective: Vitals:   12/28/18 0045 12/28/18 0100 12/28/18 0209 12/28/18 0649  BP:  124/60 (!) 130/44 (!) 133/48  Pulse: 66 (!) 59 (!) 57 66  Resp: 15 20 20 20   Temp:   98.9 F (37.2 C) 98.3 F (36.8 C)  TempSrc:   Oral Oral  SpO2: 99% 100% 97% 98%  Weight:   43.4 kg   Height:   5\' 1"  (1.549 m)     Intake/Output Summary (Last 24 hours) at 12/28/2018 1149 Last data filed at 12/28/2018 0900 Gross per 24 hour  Intake 1554.62 ml  Output 1 ml  Net 1553.62 ml   Filed Weights   12/28/18 0209  Weight: 43.4 kg    Examination:  General exam:  Appears calm and comfortable. Thin appearing. Respiratory system: Clear to auscultation. Respiratory effort normal. Cardiovascular system: S1 & S2 heard, RRR. No murmurs, rubs, gallops or clicks. Gastrointestinal system: Abdomen is mildly distended, soft and tender in upper/upper right quadrants. No organomegaly or masses felt. Normal bowel sounds heard. Central nervous system: Alert and oriented to person. No focal neurological deficits. Extremities: No edema. No calf tenderness Skin: No cyanosis. No rashes     Data Reviewed: I have personally reviewed following labs and imaging studies  CBC: Recent Labs  Lab 12/27/18 1943 12/28/18 0508  WBC 16.8* 14.2*  NEUTROABS 14.8*  --   HGB 10.3* 8.8*  HCT 33.2* 28.4*  MCV 99.1 100.7*  PLT 280 219   Basic Metabolic Panel: Recent Labs  Lab 12/27/18 1943 12/28/18 0508  NA 136 140  K 4.0 3.9  CL 108 114*  CO2 23 20*  GLUCOSE 116* 131*  BUN 22 16  CREATININE 1.11* 0.77  CALCIUM 9.3 8.0*   GFR: Estimated Creatinine Clearance: 27.5 mL/min (by C-G formula based on SCr of 0.77 mg/dL). Liver Function Tests: Recent Labs  Lab 12/27/18 1943  AST 17  ALT 12  ALKPHOS 57  BILITOT 0.5  PROT 6.9  ALBUMIN 3.8   Recent Labs  Lab 12/27/18 1943  LIPASE 46   No results for input(s): AMMONIA in the last 168 hours. Coagulation Profile: Recent Labs  Lab 12/27/18 1943  INR 1.0   Cardiac Enzymes: No results for input(s): CKTOTAL, CKMB, CKMBINDEX,  TROPONINI in the last 168 hours. BNP (last 3 results) No results for input(s): PROBNP in the last 8760 hours. HbA1C: No results for input(s): HGBA1C in the last 72 hours. CBG: No results for input(s): GLUCAP in the last 168 hours. Lipid Profile: No results for input(s): CHOL, HDL, LDLCALC, TRIG, CHOLHDL, LDLDIRECT in the last 72 hours. Thyroid Function Tests: No results for input(s): TSH, T4TOTAL, FREET4, T3FREE, THYROIDAB in the last 72 hours. Anemia Panel: No results for input(s):  VITAMINB12, FOLATE, FERRITIN, TIBC, IRON, RETICCTPCT in the last 72 hours. Sepsis Labs: Recent Labs  Lab 12/27/18 1943  LATICACIDVEN 1.1    Recent Results (from the past 240 hour(s))  SARS CORONAVIRUS 2 (TAT 6-24 HRS) Nasopharyngeal Nasopharyngeal Swab     Status: None   Collection Time: 12/28/18  1:13 AM   Specimen: Nasopharyngeal Swab  Result Value Ref Range Status   SARS Coronavirus 2 NEGATIVE NEGATIVE Final    Comment: (NOTE) SARS-CoV-2 target nucleic acids are NOT DETECTED. The SARS-CoV-2 RNA is generally detectable in upper and lower respiratory specimens during the acute phase of infection. Negative results do not preclude SARS-CoV-2 infection, do not rule out co-infections with other pathogens, and should not be used as the sole basis for treatment or other patient management decisions. Negative results must be combined with clinical observations, patient history, and epidemiological information. The expected result is Negative. Fact Sheet for Patients: SugarRoll.be Fact Sheet for Healthcare Providers: https://www.woods-mathews.com/ This test is not yet approved or cleared by the [place] FDA and  has been authorized for detection and/or diagnosis of SARS-CoV-2 by FDA under an Emergency Use Authorization (EUA). This EUA will remain  in effect (meaning this test can be used) for the duration of the COVID-19 declaration under Section 56 4(b)(1) of the Act, 21 U.S.C. section 360bbb-3(b)(1), unless the authorization is terminated or revoked sooner. Performed at [facility], [place] [address] [place], [place] [address]          Radiology Studies: Ct Abdomen Pelvis W Contrast  Result Date: 12/27/2018 CLINICAL DATA:  Generalized abdominal pain, left and right lower quadrant EXAM: CT ABDOMEN AND PELVIS WITH CONTRAST TECHNIQUE: Multidetector CT imaging of the abdomen and pelvis was performed using the standard protocol  following bolus administration of intravenous contrast. CONTRAST:  45mL OMNIPAQUE IOHEXOL 300 MG/ML SOLN, 58mL OMNIPAQUE IOHEXOL 300 MG/ML SOLN COMPARISON:  CT abdomen pelvis 12/22/2015 FINDINGS: Lower chest: Bibasilar atelectatic changes. Lung bases are otherwise clear. Heart is borderline enlarged. Atherosclerotic calcification of the coronary arteries. Hepatobiliary: No focal liver abnormality is seen. No gallstones, gallbladder wall thickening, or biliary dilatation. Pancreas: Mild pancreatic atrophy. No pancreatic ductal dilatation or surrounding inflammatory changes. Spleen: Normal in size without focal abnormality. Adrenals/Urinary Tract: Normal adrenal glands. Modest distention of the bilateral extrarenal pelves with mild urothelial thickening. Bladder is slightly distended. No CT evident urolithiasis. No concerning renal lesions. Stomach/Bowel: Moderate hiatal hernia. Enteric contrast medium traverses to the level of the sigmoid colon. No small bowel dilatation or wall thickening. No evidence of obstruction. Normal appendix in the right lower quadrant. There is segmental thickening of the sigmoid colon and rectum with adjacent pericolonic inflammatory features. Others are scattered colonic diverticula. The inflammation does not appear centered upon a focal culprit diverticulum. No extraluminal gas. No organized abscess or collection. Vascular/Lymphatic: Atherosclerotic plaque within the normal caliber aorta. Retroaortic left renal vein. No other significant vascular findings. Reactive adenopathy is noted in the low abdomen. No pathologically enlarged nodes. Reproductive: Uterus is surgically  absent. No concerning adnexal lesions. Other: Reactive free fluid in the pelvis. No abdominopelvic free air. No organized collection or abscess. Mild body wall edema. Posterior injection granulomata. Musculoskeletal: Left femoral neck is transfixed by 3 partially threaded cannulated screws. Mild resulting streak  artifact. There is dextrocurvature of the spine centered at L2. Multilevel discogenic and facet degenerative changes are present with additional posterior facet arthropathy resulting in mild to moderate multilevel foraminal narrowing and moderate to severe stenosis of the spinal canal L3-S1. IMPRESSION: 1. Segmental thickening of the sigmoid colon and rectum with adjacent pericolonic inflammatory features, consistent with colitis, either infectious or inflammatory in etiology. Scattered colonic diverticula are present though a focal diverticulitis is less favored. No evidence of perforation or abscess formation. 2. Modest distention of the bilateral extrarenal pelves with mild urothelial thickening, nonspecific, but can be seen with urinary tract infection. Correlate with urinalysis. 3. Extensive moderate to severe degenerative changes of the spine, detailed above. Multilevel foraminal narrowing and spinal canal stenosis is maximal from L3-S1. 4. Aortic Atherosclerosis (ICD10-I70.0). Electronically Signed   By: Kreg Shropshire M.D.   On: 12/27/2018 22:25   Dg Chest Port 1 View  Result Date: 12/27/2018 CLINICAL DATA:  Epigastric pain EXAM: PORTABLE CHEST 1 VIEW COMPARISON:  11/18/2016 FINDINGS: No focal opacity or pleural effusion. Mild cardiomegaly with aortic atherosclerosis. Calcified left hilar nodes. No pneumothorax. Retrocardiac lucency, presumably moderate hiatal hernia IMPRESSION: 1. Mild cardiomegaly without acute airspace disease 2. Moderate hiatal hernia suspected 3. Prior granulomatous disease Electronically Signed   By: Jasmine Pang M.D.   On: 12/27/2018 20:13        Scheduled Meds: . ALPRAZolam  0.5 mg Oral QHS  . amLODipine  5 mg Oral Daily  . aspirin EC  81 mg Oral Daily  . cefdinir  300 mg Oral Daily  . [START ON 12/29/2018] enoxaparin (LOVENOX) injection  30 mg Subcutaneous Daily  . fluticasone  1 application Topical BID  . folic acid  1 mg Oral Daily  . lisinopril  10 mg Oral Daily   . metoprolol tartrate  12.5 mg Oral BID  . metroNIDAZOLE  500 mg Oral Q8H  . olopatadine  1 drop Both Eyes BID  . prednisoLONE acetate  1 drop Both Eyes 3 times weekly   Continuous Infusions:   LOS: 0 days     Jacquelin Hawking, MD [facility] 12/28/2018, 11:49 AM  If 7PM-7AM, please contact night-coverage [email or web address]

## 2018-12-28 NOTE — ED Notes (Addendum)
DNR sicker placed on patients armband

## 2018-12-28 NOTE — Progress Notes (Signed)
Initial Nutrition Assessment  DOCUMENTATION CODES:   Underweight  INTERVENTION:   -Boost Breeze po TID, each supplement provides 250 kcal and 9 grams of protein  NUTRITION DIAGNOSIS:   Inadequate oral intake related to poor appetite(abdominal pain) as evidenced by per patient/family report.  GOAL:   Patient will meet greater than or equal to 90% of their needs  MONITOR:   PO intake, Supplement acceptance, Labs, Weight trends, I & O's  REASON FOR ASSESSMENT:   Consult Assessment of nutrition requirement/status  ASSESSMENT:   85 y.o. female with a history of hypertension. She presented secondary to abdominal pain and found to have evidence of colitis, possibly infectious.  **RD working remotely**  Patient with poor appetite related to abdominal pain from colitis. Pt currently consuming 20-30% of meals today. Will order Boost Breeze supplements for additional kcal and protein.  Per weight records, pt weighed 92 lbs on 6/4. Currently weight is +3 lbs since then.  I/Os: +1.7L since admit  Labs reviewed. Medications: Folic acid tablet daily   NUTRITION - FOCUSED PHYSICAL EXAM:  Unable to perform -working remotely.  Diet Order:   Diet Order            Diet Heart Room service appropriate? Yes; Fluid consistency: Thin  Diet effective now              EDUCATION NEEDS:   No education needs have been identified at this time  Skin:  Skin Assessment: Reviewed RN Assessment  Last BM:  9/18  Height:   Ht Readings from Last 1 Encounters:  12/28/18 5\' 1"  (1.549 m)    Weight:   Wt Readings from Last 1 Encounters:  12/28/18 43.4 kg    Ideal Body Weight:  47.7 kg  BMI:  Body mass index is 18.08 kg/m.  Estimated Nutritional Needs:   Kcal:  1250-1450  Protein:  60-70g  Fluid:  1.5L/day  Clayton Bibles, MS, RD, LDN Inpatient Clinical Dietitian Pager: [phone] After Hours Pager: [phone]

## 2018-12-29 LAB — CBC
HCT: 30 % — ABNORMAL LOW (ref 36.0–46.0)
Hemoglobin: 9.6 g/dL — ABNORMAL LOW (ref 12.0–15.0)
MCH: 30.7 pg (ref 26.0–34.0)
MCHC: 32 g/dL (ref 30.0–36.0)
MCV: 95.8 fL (ref 80.0–100.0)
Platelets: 211 10*3/uL (ref 150–400)
RBC: 3.13 MIL/uL — ABNORMAL LOW (ref 3.87–5.11)
RDW: 14.3 % (ref 11.5–15.5)
WBC: 12.2 10*3/uL — ABNORMAL HIGH (ref 4.0–10.5)
nRBC: 0 % (ref 0.0–0.2)

## 2018-12-29 MED ORDER — CEFDINIR 300 MG PO CAPS: 300.0000 mg | ORAL_CAPSULE | Freq: Every day | ORAL | 0 refills | Status: AC

## 2018-12-29 MED ORDER — METRONIDAZOLE 500 MG PO TABS: 500.0000 mg | ORAL_TABLET | Freq: Three times a day (TID) | ORAL | 0 refills | Status: AC

## 2018-12-29 MED ORDER — CEFDINIR 300 MG PO CAPS: 300.0000 mg | ORAL_CAPSULE | Freq: Every day | ORAL | 0 refills | Status: DC

## 2018-12-29 MED ORDER — METRONIDAZOLE 500 MG PO TABS: 500.0000 mg | ORAL_TABLET | Freq: Three times a day (TID) | ORAL | 0 refills | Status: DC

## 2018-12-29 NOTE — Progress Notes (Signed)
    Home health agencies that serve [id number].        Home Health Agencies Search Results  Results List Table  Home Health Agency Information Quality of Patient Care Rating Patient Survey Summary Rating  [facility] [phone] 3 out of 5 stars 4 out of 5 stars  [facility] [phone] 4  out of 5 stars 3 out of 5 stars  [facility] [phone] 4 out of 5 stars 4 out of 5 stars  [facility] [phone] 4  out of 5 stars 4 out of 5 stars  [facility] [phone] 4 out of 5 stars 4 out of 5 stars  [facility] [phone] 4 out of 5 stars 4 out of 5 stars  [facility] [phone] 3  out of 5 stars 4 out of 5 stars  [facility] [phone] 3 out of 5 stars 4 out of 5 stars  [facility] [phone] 4 out of 5 stars Not Available12  [facility] [phone] 3  out of 5 stars 3 out of 5 stars  [facility] [phone] 3  out of 5 stars 4 out of 5 stars  [facility] [phone] 3  out of 5 stars 3 out of 5 stars  [facility] [phone] 3  out of 5 stars Not Available11  [facility] [phone] 4  out of 5 stars 3 out of 5 stars   Home Health Footnotes  Footnote number Footnote as displayed on Home Health Compare  1 This agency provides services under a federal waiver program to non-traditional, chronic long term population.  2 This agency provides services to a special needs population.  3 Not Available.  4 The number of patient episodes for this measure is too small to report.  5 This measure currently does not have data or provider has been certified/recertified for less than 6 months.  6 The national average for this measure is not provided because of state-to-state differences in data collection.  7 Medicare is not displaying rates for this  measure for any home health agency, because of an issue with the data.  8 There were problems with the data and they are being corrected.  9 Zero, or very few, patients met the survey's rules for inclusion. The scores shown, if any, reflect a very small number of surveys and may not accurately tell how an agency is doing.  10 Survey results are based on less than 12 months of data.  11 Fewer than 70 patients completed the survey. Use the scores shown, if any, with caution as the number of surveys may be too low to accurately tell how an agency is doing.  12 No survey results are available for this period.  13 Data suppressed by CMS for one or more quarters.    

## 2018-12-29 NOTE — Discharge Instructions (Addendum)
[employer],  You were in the hospital because of abdominal pain from intestinal inflammation. This is possibly secondary to infection and has improved with antibiotic treatment. This should continue to improve with continued antibiotics.

## 2018-12-29 NOTE — TOC Initial Note (Signed)
Transition of Care [facility](TOC) - Initial/Assessment Note    Patient Details  Name: Lisa Freeman MRN: [id number] Date of Birth: 1918/05/21  Transition of Care (TOC) CM/SW Contact:    Armanda Heritageorres, [place] Malk[place], RN Phone Number: 12/29/2018, 5:21 PM  Clinical Narrative:   [facility].                 Expected Discharge Plan: Home w Home Health Services Barriers to Discharge: No Barriers Identified   Patient Goals and CMS Choice Patient states their goals for this hospitalization and ongoing recovery are:: to go home CMS Medicare.gov Compare Post Acute Care list provided to:: Patient Represenative (must comment) Choice offered to / list presented to : Adult Children  Expected Discharge Plan and Services Expected Discharge Plan: Home w Home Health Services   Discharge Planning Services: CM Consult Post Acute Care Choice: Home Health Living arrangements for the past 2 months: Single Family Home Expected Discharge Date: 12/29/18               DME Arranged: N/A DME Agency: NA       HH Arranged: OT, PT HH Agency: [facility] Date HH Agency Contacted: 12/29/18 Time HH Agency Contacted: 1720 [facility]  Prior Living Arrangements/Services Living arrangements for the past 2 months: Single Family Home Lives with:: Adult Children Patient language and need for interpreter reviewed:: Yes Do you feel safe going back to the place where you live?: Yes      Need for Family Participation in Patient Care: Yes (Comment) Care giver support system in place?: Yes (comment)   Criminal Activity/Legal Involvement Pertinent to Current Situation/Hospitalization: No - Comment as needed  Activities of Daily Living Home Assistive Devices/Equipment: Cane (specify quad or straight), Eyeglasses, Dentures (specify type), Walker (specify type), Wheelchair(upper/lower dentures, standard walker, single point cane) ADL Screening  (condition at time of admission) Patient's cognitive ability adequate to safely complete daily activities?: Yes Is the patient deaf or have difficulty hearing?: Yes Does the patient have difficulty seeing, even when wearing glasses/contacts?: No Does the patient have difficulty concentrating, remembering, or making decisions?: No Patient able to express need for assistance with ADLs?: Yes Does the patient have difficulty dressing or bathing?: No Independently performs ADLs?: Yes (appropriate for developmental age) Does the patient have difficulty walking or climbing stairs?: Yes Weakness of Legs: Both Weakness of Arms/Hands: Both  Permission Sought/Granted                  Emotional Assessment Appearance:: Appears stated age Attitude/Demeanor/Rapport: Engaged Affect (typically observed): Accepting Orientation: : Oriented to Self, Oriented to Place, Oriented to  Time, Oriented to Situation   Psych Involvement: No (comment)  Admission diagnosis:  Colitis [K52.9] Patient Active Problem List   Diagnosis Date Noted  . Colitis presumed infectious 12/27/2018  . Anemia   . Chronic anemia   . Insomnia   . Subclinical hyperthyroidism   . Anxiety state, unspecified   . Atrial fibrillation (HCC)   . Carotid stenosis   . Cerebral artery occlusion   . Diverticulosis of colon   . Dyshidrosis   . Other and unspecified hyperlipidemia   . Migraine   . Rosacea   . Unspecified venous (peripheral) insufficiency   . Urinary tract infection, site not specified 11/10/2013  . Urinary tract obstruction due to kidney stone 10/25/2012  . Hypertension    PCP:  Bailey MechPodraza, Cole Christopher, PA-C [id number] [place], [address] AT [facility] [address] [place] [address] Phone: [phone] Fax: [phone]     Social Determinants of Health ([place]) Interventions    Readmission Risk Interventions No flowsheet data  found.

## 2018-12-29 NOTE — Evaluation (Signed)
Physical Therapy Evaluation Patient Details Name: Lisa BinetSavannah Jakes MRN: [id number] DOB: 12-18-1918 Today's Date: 12/29/2018   History of Present Illness  Lisa Freeman is a 85 y.o. female with a history of hypertension. She presented secondary to abdominal pain and found to have evidence of colitis, possibly infectious.  Clinical Impression  Pt admitted with above diagnosis.  Pt pleasant and cooperative, incontinent of urine (wears depends garments at home). Pt will need supervision/assist with mobility at home, may benefit from HHPT post acute. Will follow in acute setting  Pt currently with functional limitations due to the deficits listed below (see PT Problem List). Pt will benefit from skilled PT to increase their independence and safety with mobility to allow discharge to the venue listed below.       Follow Up Recommendations Home health PT;Supervision for mobility/OOB    Equipment Recommendations  None recommended by PT    Recommendations for Other Services       Precautions / Restrictions Precautions Precautions: Fall Restrictions Weight Bearing Restrictions: No      Mobility  Bed Mobility Overal bed mobility: Needs Assistance Bed Mobility: Supine to Sit     Supine to sit: Min assist     General bed mobility comments: in recliner (OOB to recliner with OT)  Transfers Overall transfer level: Needs assistance Equipment used: Rolling walker (2 wheeled) Transfers: Sit to/from Stand Sit to Stand: Min assist Stand pivot transfers: Min assist       General transfer comment: min A to stand and transition to RW  Ambulation/Gait Ambulation/Gait assistance: Min assist Gait Distance (Feet): 25 Feet(15') Assistive device: Rolling walker (2 wheeled);1 person hand held assist Gait Pattern/deviations: Wide base of support;Step-through pattern;Decreased stride length;Trunk flexed;Drifts right/left;Shuffle     General Gait Details: assist to balance and maneuver RW,  attempted HHA (pt stated she used cane), required incr assist without use of RW. cues for posture, distance from RW and incr step length  Stairs            Wheelchair Mobility    Modified Rankin (Stroke Patients Only)       Balance Overall balance assessment: Needs assistance   Sitting balance-Leahy Scale: Fair Sitting balance - Comments: lost balance to L when trying to bring foot up to adjust sock     Standing balance-Leahy Scale: Poor Standing balance comment: reliant on UEs for static, reliant on UEs and external assist for dynamic tasks                             Pertinent Vitals/Pain Pain Assessment: No/denies pain    Home Living Family/patient expects to be discharged to:: Private residence Living Arrangements: Children Available Help at Discharge: Family Type of Home: House         Home Equipment: Dan HumphreysWalker - 2 wheels;Bedside commode;Cane - single point      Prior Function Level of Independence: Independent with assistive device(s)         Comments: pt reports she has been at daughter's since pandemic started. amb with cane or RW--pt stated both (?)     Hand Dominance        Extremity/Trunk Assessment   Upper Extremity Assessment Upper Extremity Assessment: Generalized weakness;Defer to OT evaluation    Lower Extremity Assessment Lower Extremity Assessment: Generalized weakness       Communication   Communication: HOH  Cognition Arousal/Alertness: Awake/alert Behavior During Therapy: WFL for tasks assessed/performed Overall Cognitive Status: Within Functional  Limits for tasks assessed                                        General Comments      Exercises     Assessment/Plan    PT Assessment Patient needs continued PT services  PT Problem List Decreased strength;Decreased activity tolerance;Decreased balance;Decreased knowledge of use of DME;Decreased mobility       PT Treatment Interventions DME  instruction;Gait training;Functional mobility training;Therapeutic activities;Patient/family education;Therapeutic exercise;Balance training    PT Goals (Current goals can be found in the Care Plan section)  Acute Rehab PT Goals Patient Stated Goal: return to PLOF PT Goal Formulation: With patient Time For Goal Achievement: 01/10/19 Potential to Achieve Goals: Good    Frequency Min 3X/week   Barriers to discharge        Co-evaluation               AM-PAC PT "6 Clicks" Mobility  Outcome Measure Help needed turning from your back to your side while in a flat bed without using bedrails?: A Little Help needed moving from lying on your back to sitting on the side of a flat bed without using bedrails?: A Little Help needed moving to and from a bed to a chair (including a wheelchair)?: A Little Help needed standing up from a chair using your arms (e.g., wheelchair or bedside chair)?: A Little Help needed to walk in hospital room?: A Little Help needed climbing 3-5 steps with a railing? : A Lot 6 Click Score: 17    End of Session Equipment Utilized During Treatment: Gait belt Activity Tolerance: Patient tolerated treatment well Patient left: with call bell/phone within reach;in chair;with chair alarm set   PT Visit Diagnosis: Difficulty in walking, not elsewhere classified (R26.2);Other abnormalities of gait and mobility (R26.89)    Time: 7893-8101 PT Time Calculation (min) (ACUTE ONLY): 24 min   Charges:   PT Evaluation $PT Eval Low Complexity: 1 Low PT Treatments $Gait Training: 8-22 mins        Kenyon Ana, PT  Pager: [phone] Acute Rehab Dept [facility]): [phone]   12/29/2018   [facility] 12/29/2018, 3:33 PM

## 2018-12-29 NOTE — Evaluation (Signed)
Occupational Therapy Evaluation Patient Details Name: Lisa Freeman MRN: [id number] DOB: [birth year] Today's Date: 12/29/2018    History of Present Illness Lisa Freeman is a 85 y.o. female with a history of hypertension. She presented secondary to abdominal pain and found to have evidence of colitis, possibly infectious.   Clinical Impression   Pt was admitted for the above.  She recently moved in with daughter and reports that she is mod I for adls. She has a cane and RW and has been using cane.  Pt also has a BSC and wears depends type of garments. Pt needs mostly min A at this time. Will follow in acute with min guard level goals.   Recommend HHOT to return to PLOF    Follow Up Recommendations  Supervision/Assistance - 24 hour;Home health OT    Equipment Recommendations  None recommended by OT    Recommendations for Other Services       Precautions / Restrictions Precautions Precautions: Fall Restrictions Weight Bearing Restrictions: No      Mobility Bed Mobility Overal bed mobility: Needs Assistance Bed Mobility: Supine to Sit     Supine to sit: Min assist     General bed mobility comments: min A for trunk for OOB  Transfers Overall transfer level: Needs assistance Equipment used: Rolling walker (2 wheeled) Transfers: Sit to/from [facility] Sit to Stand: Min assist Stand pivot transfers: Min assist       General transfer comment: min A to stand and steadying assistance for transfers    Balance Overall balance assessment: Needs assistance   Sitting balance-Leahy Scale: Fair Sitting balance - Comments: lost balance to L when trying to bring foot up to adjust sock     Standing balance-Leahy Scale: Poor                             ADL either performed or assessed with clinical judgement   ADL Overall ADL's : Needs assistance/impaired Eating/Feeding: Set up   Grooming: Set up   Upper Body Bathing: Set up   Lower  Body Bathing: Minimal assistance   Upper Body Dressing : Minimal assistance   Lower Body Dressing: Minimal assistance   Toilet Transfer: Minimal assistance;Stand-pivot;BSC;RW   Toileting- Clothing Manipulation and Hygiene: Moderate assistance         General ADL Comments: pt had an accident and urgency with bladder. Spoke to daughter; she usually wears depends.  Daughter can provide min A.  Pt donned clean gown and her jacket as she was cold     Vision         Perception     Praxis      Pertinent Vitals/Pain Pain Assessment: No/denies pain     Hand Dominance     Extremity/Trunk Assessment Upper Extremity Assessment Upper Extremity Assessment: Generalized weakness           Communication Communication Communication: HOH   Cognition Arousal/Alertness: Awake/alert Behavior During Therapy: WFL for tasks assessed/performed Overall Cognitive Status: Within Functional Limits for tasks assessed                                     General Comments       Exercises     Shoulder Instructions      Home Living Family/patient expects to be discharged to:: Private residence Living Arrangements: Children Available Help at Discharge: Family  Bathroom Toilet: Handicapped height     Home Equipment: Environmental consultantWalker - 2 wheels;Bedside commode          Prior Functioning/Environment Level of Independence: Independent with assistive device(s)        Comments: pt reports she has been at daughter's since pandemic started        OT Problem List: Decreased strength;Decreased activity tolerance;Impaired balance (sitting and/or standing)      OT Treatment/Interventions: Self-care/ADL training;DME and/or AE instruction;Energy conservation;Patient/family education;Balance training;Therapeutic activities    OT Goals(Current goals can be found in the care plan section) Acute Rehab OT Goals Patient Stated Goal: return to PLOF OT Goal  Formulation: With patient Time For Goal Achievement: 01/05/19 Potential to Achieve Goals: Good ADL Goals Pt Will Perform Lower Body Bathing: with min guard assist;sit to/from stand Pt Will Perform Lower Body Dressing: with min guard assist;sit to/from stand Pt Will Transfer to Toilet: with min guard assist;ambulating(high commode) Pt Will Perform Toileting - Clothing Manipulation and hygiene: with min guard assist;sit to/from stand  OT Frequency: Min 2X/week   Barriers to D/C:            Co-evaluation              AM-PAC OT "6 Clicks" Daily Activity     Outcome Measure Help from another person eating meals?: None Help from another person taking care of personal grooming?: A Little Help from another person toileting, which includes using toliet, bedpan, or urinal?: A Lot Help from another person bathing (including washing, rinsing, drying)?: A Little Help from another person to put on and taking off regular upper body clothing?: A Little Help from another person to put on and taking off regular lower body clothing?: A Little 6 Click Score: 18   End of Session    Activity Tolerance: Patient tolerated treatment well Patient left: in chair;with call bell/phone within reach;with chair alarm set  OT Visit Diagnosis: Unsteadiness on feet (R26.81)                Time: [id number] OT Time Calculation (min): 27 min Charges:  OT General Charges $OT Visit: 1 Visit OT Evaluation $OT Eval Low Complexity: 1 Low OT Treatments $Self Care/Home Management : 8-22 mins  Marica OtterMaryellen Tylerjames Freeman, OTR/L Acute Rehabilitation Services [phone] [facility] pager [phone] office 12/29/2018  Lisa Freeman 12/29/2018, 3:11 PM

## 2018-12-29 NOTE — Discharge Summary (Signed)
Physician Discharge Summary  Lisa Freeman QAS:[id number] DOB: 1918/08/25 DOA: 12/27/2018  PCP: Jonathon Bellows, PA-C  Admit date: 12/27/2018 Discharge date: 12/29/2018  Admitted From: Home Disposition: Home  Recommendations for Outpatient Follow-up:  1. Follow up with PCP in 1 week 2. Please obtain BMP/CBC in one week 3. Please follow up on the following pending results: None  Home Health: PT, OT Equipment/Devices: None  Discharge Condition: Stable CODE STATUS: DNR Diet recommendation: Regular diet   Brief/Interim Summary:  Admission HPI written by Etta Quill, DO   Chief Complaint: Abd pain  HPI: Lisa Freeman is a 85 y.o. female with medical history significant of HTN.  Patient presents to the ED with c/o abd pain.  Pain onset earlier today.  Located in central abdomen.  Has had nl BMs today and this evening (though RNs are reporting diarrhea).  No dysuria, no flank pain.  Were concerned it might be constipation, but after multiple BMs, are fairly convinced that it isnt.   ED Course: WBC 16k.  No other SIRS.  CT abd/pelvis shows uncomplicated colitis.  Infectious vs inflammatory.  Diverticulitis possible but less likely.  No perforation, no abscess.  UA negative.   Hospital course:  Colitis Possibly infection. Started on empiric ceftriaxone and metronidazole. No fevers but elevated WBC. WBC is trended down. Abdominal pain is improved. Patient tolerated transition to oral cefdinir and flagyl. Discharge with total 7 day course. Home with home health PT/OT  Essential hypertension Continue amlodipine and lisinopril  Anxiety Continue Xanax  Discharge Diagnoses:  Principal Problem:   Colitis presumed infectious Active Problems:   Hypertension    Discharge Instructions  Discharge Instructions    Call MD for:  severe uncontrolled pain   Complete by: As directed    Call MD for:  temperature >100.4   Complete by: As directed   Increase activity slowly   Complete by: As directed      Allergies as of 12/29/2018      Reactions   Penicillins Shortness Of Breath   Codeine Nausea And Vomiting   Doxycycline    Fluticasone Propionate    Moxifloxacin    Nitrofurantoin Monohyd Macro    Promethazine    Sulfa Antibiotics    Trimethoprim       Medication List    TAKE these medications   ALPRAZolam 0.25 MG tablet Commonly known as: XANAX Take 0.5 mg by mouth at bedtime.   amLODipine 5 MG tablet Commonly known as: NORVASC Take 5 mg by mouth daily.   aspirin EC 81 MG tablet Take 81 mg by mouth daily.   cefdinir 300 MG capsule Commonly known as: OMNICEF Take 1 capsule (300 mg total) by mouth daily for 4 days. Start taking on: December 30, 2018   fluticasone 0.05 % cream Commonly known as: CUTIVATE Apply 1 application topically 2 (two) times daily.   folic acid 1 MG tablet Commonly known as: FOLVITE Take 1 mg by mouth daily.   lisinopril 10 MG tablet Commonly known as: ZESTRIL Take 10 mg by mouth daily.   metoprolol tartrate 25 MG tablet Commonly known as: LOPRESSOR Take 12.5 mg by mouth 2 (two) times daily. Take 1/2 tablet (12.5mg ) twice daily   metroNIDAZOLE 500 MG tablet Commonly known as: FLAGYL Take 1 tablet (500 mg total) by mouth every 8 (eight) hours for 4 days.   Pataday 0.2 % Soln Generic drug: Olopatadine HCl Apply 1 drop to eye as directed.   prednisoLONE acetate 1 %  ophthalmic suspension Commonly known as: PRED FORTE Place 1 drop into both eyes 3 (three) times a week.       Allergies  Allergen Reactions  . Penicillins Shortness Of Breath  . Codeine Nausea And Vomiting  . Doxycycline   . Fluticasone Propionate   . Moxifloxacin   . Nitrofurantoin Monohyd Macro   . Promethazine   . Sulfa Antibiotics   . Trimethoprim     Consultations:  None   Procedures/Studies: Ct Abdomen Pelvis W Contrast  Result Date: 12/27/2018 CLINICAL DATA:  Generalized abdominal pain,  left and right lower quadrant EXAM: CT ABDOMEN AND PELVIS WITH CONTRAST TECHNIQUE: Multidetector CT imaging of the abdomen and pelvis was performed using the standard protocol following bolus administration of intravenous contrast. CONTRAST:  75mL OMNIPAQUE IOHEXOL 300 MG/ML SOLN, 30mL OMNIPAQUE IOHEXOL 300 MG/ML SOLN COMPARISON:  CT abdomen pelvis 12/22/2015 FINDINGS: Lower chest: Bibasilar atelectatic changes. Lung bases are otherwise clear. Heart is borderline enlarged. Atherosclerotic calcification of the coronary arteries. Hepatobiliary: No focal liver abnormality is seen. No gallstones, gallbladder wall thickening, or biliary dilatation. Pancreas: Mild pancreatic atrophy. No pancreatic ductal dilatation or surrounding inflammatory changes. Spleen: Normal in size without focal abnormality. Adrenals/Urinary Tract: Normal adrenal glands. Modest distention of the bilateral extrarenal pelves with mild urothelial thickening. Bladder is slightly distended. No CT evident urolithiasis. No concerning renal lesions. Stomach/Bowel: Moderate hiatal hernia. Enteric contrast medium traverses to the level of the sigmoid colon. No small bowel dilatation or wall thickening. No evidence of obstruction. Normal appendix in the right lower quadrant. There is segmental thickening of the sigmoid colon and rectum with adjacent pericolonic inflammatory features. Others are scattered colonic diverticula. The inflammation does not appear centered upon a focal culprit diverticulum. No extraluminal gas. No organized abscess or collection. Vascular/Lymphatic: Atherosclerotic plaque within the normal caliber aorta. Retroaortic left renal vein. No other significant vascular findings. Reactive adenopathy is noted in the low abdomen. No pathologically enlarged nodes. Reproductive: Uterus is surgically absent. No concerning adnexal lesions. Other: Reactive free fluid in the pelvis. No abdominopelvic free air. No organized collection or abscess.  Mild body wall edema. Posterior injection granulomata. Musculoskeletal: Left femoral neck is transfixed by 3 partially threaded cannulated screws. Mild resulting streak artifact. There is dextrocurvature of the spine centered at L2. Multilevel discogenic and facet degenerative changes are present with additional posterior facet arthropathy resulting in mild to moderate multilevel foraminal narrowing and moderate to severe stenosis of the spinal canal L3-S1. IMPRESSION: 1. Segmental thickening of the sigmoid colon and rectum with adjacent pericolonic inflammatory features, consistent with colitis, either infectious or inflammatory in etiology. Scattered colonic diverticula are present though a focal diverticulitis is less favored. No evidence of perforation or abscess formation. 2. Modest distention of the bilateral extrarenal pelves with mild urothelial thickening, nonspecific, but can be seen with urinary tract infection. Correlate with urinalysis. 3. Extensive moderate to severe degenerative changes of the spine, detailed above. Multilevel foraminal narrowing and spinal canal stenosis is maximal from L3-S1. 4. Aortic Atherosclerosis (ICD10-I70.0). Electronically Signed   By: Kreg ShropshirePrice  DeHay M.D.   On: 12/27/2018 22:25   Dg Chest Port 1 View  Result Date: 12/27/2018 CLINICAL DATA:  Epigastric pain EXAM: PORTABLE CHEST 1 VIEW COMPARISON:  11/18/2016 FINDINGS: No focal opacity or pleural effusion. Mild cardiomegaly with aortic atherosclerosis. Calcified left hilar nodes. No pneumothorax. Retrocardiac lucency, presumably moderate hiatal hernia IMPRESSION: 1. Mild cardiomegaly without acute airspace disease 2. Moderate hiatal hernia suspected 3. Prior granulomatous disease Electronically Signed  By: Jasmine PangKim  Fujinaga M.D.   On: 12/27/2018 20:13      Subjective: Patient states her pain is better.  Discharge Exam: Vitals:   12/29/18 0454 12/29/18 1307  BP: (!) 125/42 (!) 131/49  Pulse: 73 65  Resp: 19 16   Temp: 98.7 F (37.1 C) 98.6 F (37 C)  SpO2: 98% 99%   Vitals:   12/28/18 1330 12/28/18 2116 12/29/18 0454 12/29/18 1307  BP: (!) 128/41 (!) 139/54 (!) 125/42 (!) 131/49  Pulse: 74 79 73 65  Resp: 17 16 19 16   Temp: 99.5 F (37.5 C) 98.4 F (36.9 C) 98.7 F (37.1 C) 98.6 F (37 C)  TempSrc: Oral Oral Oral Oral  SpO2: 96% 98% 98% 99%  Weight:      Height:        General: Pt is alert, awake, not in acute distress Cardiovascular: RRR, S1/S2 +, no rubs, no gallops Respiratory: CTA bilaterally, no wheezing, no rhonchi Abdominal: Soft, tenderness, ND, bowel sounds + Extremities: no edema, no cyanosis    The results of significant diagnostics from this hospitalization (including imaging, microbiology, ancillary and laboratory) are listed below for reference.     Microbiology: Recent Results (from the past 240 hour(s))  SARS CORONAVIRUS 2 (TAT 6-24 HRS) Nasopharyngeal Nasopharyngeal Swab     Status: None   Collection Time: 12/28/18  1:13 AM   Specimen: Nasopharyngeal Swab  Result Value Ref Range Status   SARS Coronavirus 2 NEGATIVE NEGATIVE Final    Comment: (NOTE) SARS-CoV-2 target nucleic acids are NOT DETECTED. The SARS-CoV-2 RNA is generally detectable in upper and lower respiratory specimens during the acute phase of infection. Negative results do not preclude SARS-CoV-2 infection, do not rule out co-infections with other pathogens, and should not be used as the sole basis for treatment or other patient management decisions. Negative results must be combined with clinical observations, patient history, and epidemiological information. The expected result is Negative. Fact Sheet for Patients: HairSlick.nohttps://www.fda.gov/media/138098/download Fact Sheet for Healthcare Providers: quierodirigir.comhttps://www.fda.gov/media/138095/download This test is not yet approved or cleared by the [place] FDA and  has been authorized for detection and/or diagnosis of SARS-CoV-2 by FDA under an  Emergency Use Authorization (EUA). This EUA will remain  in effect (meaning this test can be used) for the duration of the COVID-19 declaration under Section 56 4(b)(1) of the Act, 21 U.S.C. section 360bbb-3(b)(1), unless the authorization is terminated or revoked sooner. Performed at [facility], [address] [place], [place] [address]      Labs: BNP (last 3 results) No results for input(s): BNP in the last 8760 hours. Basic Metabolic Panel: Recent Labs  Lab 12/27/18 1943 12/28/18 0508  NA 136 140  K 4.0 3.9  CL 108 114*  CO2 23 20*  GLUCOSE 116* 131*  BUN 22 16  CREATININE 1.11* 0.77  CALCIUM 9.3 8.0*   Liver Function Tests: Recent Labs  Lab 12/27/18 1943  AST 17  ALT 12  ALKPHOS 57  BILITOT 0.5  PROT 6.9  ALBUMIN 3.8   Recent Labs  Lab 12/27/18 1943  LIPASE 46   No results for input(s): AMMONIA in the last 168 hours. CBC: Recent Labs  Lab 12/27/18 1943 12/28/18 0508 12/29/18 0429  WBC 16.8* 14.2* 12.2*  NEUTROABS 14.8*  --   --   HGB 10.3* 8.8* 9.6*  HCT 33.2* 28.4* 30.0*  MCV 99.1 100.7* 95.8  PLT 280 219 211   Cardiac Enzymes: No results for input(s): CKTOTAL, CKMB, CKMBINDEX,  TROPONINI in the last 168 hours. BNP: Invalid input(s): POCBNP CBG: No results for input(s): GLUCAP in the last 168 hours. D-Dimer No results for input(s): DDIMER in the last 72 hours. Hgb A1c No results for input(s): HGBA1C in the last 72 hours. Lipid Profile No results for input(s): CHOL, HDL, LDLCALC, TRIG, CHOLHDL, LDLDIRECT in the last 72 hours. Thyroid function studies No results for input(s): TSH, T4TOTAL, T3FREE, THYROIDAB in the last 72 hours.  Invalid input(s): FREET3 Anemia work up No results for input(s): VITAMINB12, FOLATE, FERRITIN, TIBC, IRON, RETICCTPCT in the last 72 hours. Urinalysis    Component Value Date/Time   COLORURINE STRAW (A) 12/27/2018 2222   APPEARANCEUR CLEAR 12/27/2018 2222   LABSPEC 1.008 12/27/2018 2222   PHURINE  6.0 12/27/2018 2222   GLUCOSEU NEGATIVE 12/27/2018 2222   HGBUR NEGATIVE 12/27/2018 2222   BILIRUBINUR NEGATIVE 12/27/2018 2222   KETONESUR NEGATIVE 12/27/2018 2222   PROTEINUR NEGATIVE 12/27/2018 2222   UROBILINOGEN 0.2 10/25/2012 1646   NITRITE NEGATIVE 12/27/2018 2222   LEUKOCYTESUR NEGATIVE 12/27/2018 2222   Sepsis Labs Invalid input(s): PROCALCITONIN,  WBC,  LACTICIDVEN Microbiology Recent Results (from the past 240 hour(s))  SARS CORONAVIRUS 2 (TAT 6-24 HRS) Nasopharyngeal Nasopharyngeal Swab     Status: None   Collection Time: 12/28/18  1:13 AM   Specimen: Nasopharyngeal Swab  Result Value Ref Range Status   SARS Coronavirus 2 NEGATIVE NEGATIVE Final    Comment: (NOTE) SARS-CoV-2 target nucleic acids are NOT DETECTED. The SARS-CoV-2 RNA is generally detectable in upper and lower respiratory specimens during the acute phase of infection. Negative results do not preclude SARS-CoV-2 infection, do not rule out co-infections with other pathogens, and should not be used as the sole basis for treatment or other patient management decisions. Negative results must be combined with clinical observations, patient history, and epidemiological information. The expected result is Negative. Fact Sheet for Patients: HairSlick.no Fact Sheet for Healthcare Providers: quierodirigir.com This test is not yet approved or cleared by the [place] FDA and  has been authorized for detection and/or diagnosis of SARS-CoV-2 by FDA under an Emergency Use Authorization (EUA). This EUA will remain  in effect (meaning this test can be used) for the duration of the COVID-19 declaration under Section 56 4(b)(1) of the Act, 21 U.S.C. section 360bbb-3(b)(1), unless the authorization is terminated or revoked sooner. Performed at [facility], [address] [place], [place] [address]      Time coordinating discharge: 35 minutes   SIGNED:   Jacquelin Hawking, MD [facility] 12/29/2018, 4:57 PM

## 2018-12-29 NOTE — Progress Notes (Signed)
Discharge instructions discussed with daughter Junious Dresser over the phone. No immediate questions or concerns. Scripts sent to pharmacy of choice.

## 2019-01-11 ENCOUNTER — Inpatient Hospital Stay (HOSPITAL_BASED_OUTPATIENT_CLINIC_OR_DEPARTMENT_OTHER)
Admission: EM | Admit: 2019-01-11 | Discharge: 2019-01-15 | DRG: 689 | Disposition: A | Discharge status: Home Health | Payer: Medicare Other | Attending: Internal Medicine | Admitting: Internal Medicine

## 2019-01-11 ENCOUNTER — Encounter (HOSPITAL_BASED_OUTPATIENT_CLINIC_OR_DEPARTMENT_OTHER): Payer: Self-pay | Admitting: Adult Health

## 2019-01-11 ENCOUNTER — Other Ambulatory Visit: Payer: Self-pay

## 2019-01-11 ENCOUNTER — Emergency Department (HOSPITAL_BASED_OUTPATIENT_CLINIC_OR_DEPARTMENT_OTHER): Payer: Medicare Other

## 2019-01-11 DIAGNOSIS — N179 Acute kidney failure, unspecified: Secondary | ICD-10-CM | POA: Diagnosis present

## 2019-01-11 DIAGNOSIS — R1030 Lower abdominal pain, unspecified: Secondary | ICD-10-CM

## 2019-01-11 DIAGNOSIS — I10 Essential (primary) hypertension: Secondary | ICD-10-CM | POA: Diagnosis present

## 2019-01-11 DIAGNOSIS — Z79899 Other long term (current) drug therapy: Secondary | ICD-10-CM

## 2019-01-11 DIAGNOSIS — E43 Unspecified severe protein-calorie malnutrition: Secondary | ICD-10-CM | POA: Diagnosis present

## 2019-01-11 DIAGNOSIS — Z882 Allergy status to sulfonamides status: Secondary | ICD-10-CM

## 2019-01-11 DIAGNOSIS — N3000 Acute cystitis without hematuria: Secondary | ICD-10-CM | POA: Diagnosis not present

## 2019-01-11 DIAGNOSIS — I480 Paroxysmal atrial fibrillation: Secondary | ICD-10-CM | POA: Diagnosis present

## 2019-01-11 DIAGNOSIS — N390 Urinary tract infection, site not specified: Secondary | ICD-10-CM | POA: Diagnosis present

## 2019-01-11 DIAGNOSIS — J189 Pneumonia, unspecified organism: Secondary | ICD-10-CM

## 2019-01-11 DIAGNOSIS — B9620 Unspecified Escherichia coli [E. coli] as the cause of diseases classified elsewhere: Secondary | ICD-10-CM | POA: Diagnosis present

## 2019-01-11 DIAGNOSIS — I4891 Unspecified atrial fibrillation: Secondary | ICD-10-CM | POA: Diagnosis present

## 2019-01-11 DIAGNOSIS — Z20828 Contact with and (suspected) exposure to other viral communicable diseases: Secondary | ICD-10-CM | POA: Diagnosis present

## 2019-01-11 DIAGNOSIS — Z888 Allergy status to other drugs, medicaments and biological substances status: Secondary | ICD-10-CM

## 2019-01-11 DIAGNOSIS — F419 Anxiety disorder, unspecified: Secondary | ICD-10-CM | POA: Diagnosis present

## 2019-01-11 DIAGNOSIS — E611 Iron deficiency: Secondary | ICD-10-CM | POA: Diagnosis present

## 2019-01-11 DIAGNOSIS — Z90710 Acquired absence of both cervix and uterus: Secondary | ICD-10-CM

## 2019-01-11 DIAGNOSIS — Z880 Allergy status to penicillin: Secondary | ICD-10-CM

## 2019-01-11 DIAGNOSIS — Z7982 Long term (current) use of aspirin: Secondary | ICD-10-CM

## 2019-01-11 DIAGNOSIS — Z881 Allergy status to other antibiotic agents status: Secondary | ICD-10-CM

## 2019-01-11 DIAGNOSIS — Z1623 Resistance to quinolones and fluoroquinolones: Secondary | ICD-10-CM | POA: Diagnosis present

## 2019-01-11 DIAGNOSIS — D539 Nutritional anemia, unspecified: Secondary | ICD-10-CM | POA: Diagnosis present

## 2019-01-11 DIAGNOSIS — H9190 Unspecified hearing loss, unspecified ear: Secondary | ICD-10-CM | POA: Diagnosis present

## 2019-01-11 DIAGNOSIS — Z681 Body mass index (BMI) 19 or less, adult: Secondary | ICD-10-CM

## 2019-01-11 DIAGNOSIS — R197 Diarrhea, unspecified: Secondary | ICD-10-CM | POA: Diagnosis present

## 2019-01-11 DIAGNOSIS — E860 Dehydration: Secondary | ICD-10-CM | POA: Diagnosis present

## 2019-01-11 DIAGNOSIS — R109 Unspecified abdominal pain: Secondary | ICD-10-CM | POA: Diagnosis present

## 2019-01-11 DIAGNOSIS — Z66 Do not resuscitate: Secondary | ICD-10-CM | POA: Diagnosis present

## 2019-01-11 LAB — URINALYSIS, ROUTINE W REFLEX MICROSCOPIC
Bilirubin Urine: NEGATIVE
Glucose, UA: NEGATIVE mg/dL
Ketones, ur: NEGATIVE mg/dL
Nitrite: POSITIVE — AB
Protein, ur: NEGATIVE mg/dL
Specific Gravity, Urine: 1.015 (ref 1.005–1.030)
pH: 6 (ref 5.0–8.0)

## 2019-01-11 LAB — CBC
HCT: 31.4 % — ABNORMAL LOW (ref 36.0–46.0)
Hemoglobin: 9.5 g/dL — ABNORMAL LOW (ref 12.0–15.0)
MCH: 30.5 pg (ref 26.0–34.0)
MCHC: 30.3 g/dL (ref 30.0–36.0)
MCV: 101 fL — ABNORMAL HIGH (ref 80.0–100.0)
Platelets: 582 10*3/uL — ABNORMAL HIGH (ref 150–400)
RBC: 3.11 MIL/uL — ABNORMAL LOW (ref 3.87–5.11)
RDW: 14.9 % (ref 11.5–15.5)
WBC: 10.2 10*3/uL (ref 4.0–10.5)
nRBC: 0 % (ref 0.0–0.2)

## 2019-01-11 LAB — COMPREHENSIVE METABOLIC PANEL
ALT: 13 U/L (ref 0–44)
AST: 16 U/L (ref 15–41)
Albumin: 3.4 g/dL — ABNORMAL LOW (ref 3.5–5.0)
Alkaline Phosphatase: 75 U/L (ref 38–126)
Anion gap: 9 (ref 5–15)
BUN: 33 mg/dL — ABNORMAL HIGH (ref 8–23)
CO2: 21 mmol/L — ABNORMAL LOW (ref 22–32)
Calcium: 9.6 mg/dL (ref 8.9–10.3)
Chloride: 106 mmol/L (ref 98–111)
Creatinine, Ser: 1.02 mg/dL — ABNORMAL HIGH (ref 0.44–1.00)
GFR calc Af Amer: 53 mL/min — ABNORMAL LOW (ref >60)
GFR calc non Af Amer: 46 mL/min — ABNORMAL LOW (ref >60)
Glucose, Bld: 134 mg/dL — ABNORMAL HIGH (ref 70–99)
Potassium: 4.1 mmol/L (ref 3.5–5.1)
Sodium: 136 mmol/L (ref 135–145)
Total Bilirubin: 0.1 mg/dL — ABNORMAL LOW (ref 0.3–1.2)
Total Protein: 7.1 g/dL (ref 6.5–8.1)

## 2019-01-11 LAB — URINALYSIS, MICROSCOPIC (REFLEX): WBC, UA: >50 WBC/hpf (ref 0–5)

## 2019-01-11 LAB — LACTIC ACID, PLASMA
Lactic Acid, Venous: 1 mmol/L (ref 0.5–1.9)
Lactic Acid, Venous: 0.6 mmol/L (ref 0.5–1.9)

## 2019-01-11 LAB — LIPASE, BLOOD: Lipase: 62 U/L — ABNORMAL HIGH (ref 11–51)

## 2019-01-11 LAB — SARS CORONAVIRUS 2 BY RT PCR (HOSPITAL ORDER, PERFORMED IN ~~LOC~~ HOSPITAL LAB): SARS Coronavirus 2: NEGATIVE

## 2019-01-11 MED ORDER — CEFEPIME HCL 1 G IJ SOLR
INTRAMUSCULAR | Status: AC
  Filled 2019-01-11: qty 1

## 2019-01-11 MED ORDER — SODIUM CHLORIDE 0.9 % IV SOLN
INTRAVENOUS | Status: DC | PRN
  Administered 2019-01-11 (×2): 100 mL via INTRAVENOUS

## 2019-01-11 MED ORDER — ACETAMINOPHEN 325 MG PO TABS
ORAL_TABLET | ORAL | Status: AC
  Filled 2019-01-11: qty 2

## 2019-01-11 MED ORDER — ACETAMINOPHEN 500 MG PO TABS
1000.0000 mg | ORAL_TABLET | Freq: Once | ORAL | Status: AC
  Administered 2019-01-11: 1000 mg via ORAL

## 2019-01-11 MED ORDER — ACETAMINOPHEN 500 MG PO TABS
ORAL_TABLET | ORAL | Status: AC
  Filled 2019-01-11: qty 2

## 2019-01-11 MED ORDER — SODIUM CHLORIDE 0.9 % IV BOLUS
500.0000 mL | Freq: Once | INTRAVENOUS | Status: AC
  Administered 2019-01-11: 500 mL via INTRAVENOUS

## 2019-01-11 MED ORDER — SODIUM CHLORIDE 0.9 % IV SOLN
1.0000 g | Freq: Once | INTRAVENOUS | Status: AC
  Administered 2019-01-11: 23:00:00 1 g via INTRAVENOUS

## 2019-01-11 MED ORDER — IOHEXOL 300 MG/ML  SOLN
100.0000 mL | Freq: Once | INTRAMUSCULAR | Status: AC | PRN
  Administered 2019-01-11: 18:00:00 75 mL via INTRAVENOUS

## 2019-01-11 MED ORDER — SODIUM CHLORIDE 0.9 % IV SOLN
INTRAVENOUS | Status: DC | PRN
  Administered 2019-01-11: 21:00:00 100 mL via INTRAVENOUS

## 2019-01-11 MED ORDER — VANCOMYCIN HCL IN DEXTROSE 1-5 GM/200ML-% IV SOLN
1000.0000 mg | Freq: Once | INTRAVENOUS | Status: AC
  Administered 2019-01-11: 21:00:00 1000 mg via INTRAVENOUS
  Filled 2019-01-11: qty 200

## 2019-01-11 MED ORDER — METRONIDAZOLE IN NACL 5-0.79 MG/ML-% IV SOLN
500.0000 mg | Freq: Once | INTRAVENOUS | Status: AC
  Administered 2019-01-11: 500 mg via INTRAVENOUS
  Filled 2019-01-11: qty 100

## 2019-01-11 NOTE — ED Provider Notes (Signed)
[facility] EMERGENCY DEPARTMENT Provider Note   CSN: [id number] Arrival date & time: 01/11/19  1611     History   Chief Complaint Chief Complaint  Patient presents with   Abdominal Pain    HPI Lisa Freeman is a 85 y.o. female.     Lisa Freeman is a 85 y.o. female with complex medical history as listed below, who had recent hospital admission for colitis on 9/17, who presents today due to returning lower abdominal pain.  Patient is accompanied by her daughter who helps provide history.  She reports that she was admitted to the hospital in 9/17 for 3 days, treated with IV Rocephin and Flagyl for colitis, with improving symptoms she was discharged home and completed 4 additional days of cefdinir and Flagyl, after that symptoms seem to resolve, up until Monday when she began having some intermittent episodes of diarrhea.  She denies any blood in the stool.  Reports that over the past 3 days lower abdominal pain has returned she localizes pain across the lower abdomen.  She has not had any associated nausea or vomiting.  Reports she has had one episode of diarrhea today.  No fevers or chills.  She does report urinary frequency and dysuria.  Daughter reports that she is typically incontinent at night and has been going to the bathroom more frequently.  Patient denies chest pain shortness of breath or cough.  Denies any sick contacts since leaving the hospital.  At baseline she ambulates with a walker.  No other aggravating or alleviating factors.     Past Medical History:  Diagnosis Date   Hypertension     Patient Active Problem List   Diagnosis Date Noted   Colitis presumed infectious 12/27/2018   Anemia    Chronic anemia    Insomnia    Subclinical hyperthyroidism    Anxiety state, unspecified    Atrial fibrillation (HCC)    Carotid stenosis    Cerebral artery occlusion    Diverticulosis of colon    Dyshidrosis    Other and unspecified  hyperlipidemia    Migraine    Rosacea    Unspecified venous (peripheral) insufficiency    Urinary tract infection, site not specified 11/10/2013   Urinary tract obstruction due to kidney stone 10/25/2012   Hypertension     Past Surgical History:  Procedure Laterality Date   ABDOMINAL HYSTERECTOMY     APPENDECTOMY     BACK SURGERY     CYSTOSCOPY W/ RETROGRADES Left 11/06/2012   Procedure: CYSTOSCOPY WITH RETROGRADE PYELOGRAM;  Surgeon: Lindaann Slough, MD;  Location: [facility];  Service: Urology;  Laterality: Left;   CYSTOSCOPY W/ URETERAL STENT REMOVAL Left 11/06/2012   Procedure: CYSTOSCOPY WITH STENT REMOVAL;  Surgeon: Lindaann Slough, MD;  Location: [facility];  Service: Urology;  Laterality: Left;   CYSTOSCOPY WITH RETROGRADE PYELOGRAM, URETEROSCOPY AND STENT PLACEMENT Left 10/25/2012   Procedure: CYSTOSCOPY/LEFT RETROGRADE PYELOGRAM/PLACEMENT LEFT URETERAL STENT;  Surgeon: Lindaann Slough, MD;  Location: [facility];  Service: Urology;  Laterality: Left;  CYSTOSCOPY/LEFT RETROGRADE PYELOGRAM/URETEROSCOPY/PLACEMENT URETERAL STENT   CYSTOSCOPY WITH STENT PLACEMENT Left 11/06/2012   Procedure: CYSTOSCOPY WITH STENT PLACEMENT;  Surgeon: Lindaann Slough, MD;  Location: [facility];  Service: Urology;  Laterality: Left;   CYSTOSCOPY WITH URETEROSCOPY AND STENT PLACEMENT Left 11/06/2012   Procedure: CYSTOSCOPY WITH URETEROSCOPY, STONE MANIPULATION;  Surgeon: Lindaann Slough, MD;  Location: [facility];  Service: Urology;  Laterality: Left;   HIP FRACTURE SURGERY  Left      OB History   No obstetric history on file.      Home Medications    Prior to Admission medications   Medication Sig Start Date End Date Taking? Authorizing Provider  ALPRAZolam Prudy Feeler) 0.25 MG tablet Take 0.5 mg by mouth at bedtime.     [provider]  amLODipine (NORVASC) 5 MG tablet Take 5 mg by mouth daily.    [provider]  aspirin EC 81 MG tablet Take 81 mg by mouth daily.    [provider]  fluticasone (CUTIVATE) 0.05 % cream Apply 1 application topically 2 (two) times daily.    [provider]  folic acid (FOLVITE) 1 MG tablet Take 1 mg by mouth daily.    [provider]  lisinopril (PRINIVIL,ZESTRIL) 10 MG tablet Take 10 mg by mouth daily.    [provider]  metoprolol tartrate (LOPRESSOR) 25 MG tablet Take 12.5 mg by mouth 2 (two) times daily. Take 1/2 tablet (12.5mg ) twice daily    [provider]  Olopatadine HCl (PATADAY) 0.2 % SOLN Apply 1 drop to eye as directed.    [provider]  prednisoLONE acetate (PRED FORTE) 1 % ophthalmic suspension Place 1 drop into both eyes 3 (three) times a week.    [provider]    Family History History reviewed. No pertinent family history.  Social History Social History   Tobacco Use   Smoking status: Never Smoker   Smokeless tobacco: Never Used  Substance Use Topics   Alcohol use: No   Drug use: No     Allergies   Penicillins, Codeine, Doxycycline, Fluticasone propionate, Moxifloxacin, Nitrofurantoin monohyd macro, Promethazine, Sulfa antibiotics, and Trimethoprim   Review of Systems Review of Systems  Constitutional: Negative for chills and fever.  HENT: Negative.   Respiratory: Negative for cough and shortness of breath.   Cardiovascular: Negative for chest pain.  Gastrointestinal: Positive for abdominal pain and diarrhea. Negative for blood in stool, nausea and vomiting.  Genitourinary: Positive for dysuria and frequency. Negative for flank pain.  Musculoskeletal: Negative for arthralgias and myalgias.  Skin: Negative for color change and rash.  Neurological: Negative for dizziness, syncope and light-headedness.  All other systems reviewed and are negative.    Physical Exam Updated Vital Signs BP (!) 178/77 (BP Location: Right Arm)    Pulse 72    Temp 98.4 F (36.9 C)  (Oral)    Resp 17    Wt 44.5 kg    SpO2 99%    BMI 18.52 kg/m   Physical Exam Vitals signs and nursing note reviewed.  Constitutional:      General: She is not in acute distress.    Appearance: She is well-developed. She is not diaphoretic.     Comments: Elderly female, laying comfortably, in no acute distress  HENT:     Head: Normocephalic and atraumatic.  Eyes:     General:        Right eye: No discharge.        Left eye: No discharge.     Pupils: Pupils are equal, round, and reactive to light.  Neck:     Musculoskeletal: Neck supple.  Cardiovascular:     Rate and Rhythm: Normal rate and regular rhythm.     Heart sounds: Normal heart sounds. No murmur. No friction rub. No gallop.   Pulmonary:     Effort: Pulmonary effort is normal. No respiratory distress.     Breath sounds:  Normal breath sounds. No wheezing or rales.     Comments: Respirations equal and unlabored, patient able to speak in full sentences, lungs clear to auscultation bilaterally, breath sounds slightly diminished in the bases. Abdominal:     General: Bowel sounds are increased. There is no distension.     Palpations: Abdomen is soft. There is no mass.     Tenderness: There is abdominal tenderness in the right lower quadrant, suprapubic area and left lower quadrant. There is no guarding.     Comments: Abdomen is soft and nondistended, bowel sounds present throughout and slightly hyperactive.  There is tenderness across the lower abdomen most notably in the suprapubic and right lower quadrants, without peritoneal signs.  No CVA tenderness.  Musculoskeletal:        General: No deformity.  Skin:    General: Skin is warm and dry.     Capillary Refill: Capillary refill takes less than 2 seconds.  Neurological:     Mental Status: She is alert.     Coordination: Coordination normal.     Comments: Speech is clear, able to follow commands Moves extremities without ataxia, coordination intact  Psychiatric:        Mood  and Affect: Mood normal.        Behavior: Behavior normal.      ED Treatments / Results  Labs (all labs ordered are listed, but only abnormal results are displayed) Labs Reviewed  LIPASE, BLOOD - Abnormal; Notable for the following components:      Result Value   Lipase 62 (*)    All other components within normal limits  COMPREHENSIVE METABOLIC PANEL - Abnormal; Notable for the following components:   CO2 21 (*)    Glucose, Bld 134 (*)    BUN 33 (*)    Creatinine, Ser 1.02 (*)    Albumin 3.4 (*)    Total Bilirubin 0.1 (*)    GFR calc non Af Amer 46 (*)    GFR calc Af Amer 53 (*)    All other components within normal limits  CBC - Abnormal; Notable for the following components:   RBC 3.11 (*)    Hemoglobin 9.5 (*)    HCT 31.4 (*)    MCV 101.0 (*)    Platelets 582 (*)    All other components within normal limits  URINALYSIS, ROUTINE W REFLEX MICROSCOPIC - Abnormal; Notable for the following components:   APPearance CLOUDY (*)    Hgb urine dipstick TRACE (*)    Nitrite POSITIVE (*)    Leukocytes,Ua MODERATE (*)    All other components within normal limits  URINALYSIS, MICROSCOPIC (REFLEX) - Abnormal; Notable for the following components:   Bacteria, UA MANY (*)    All other components within normal limits  SARS CORONAVIRUS 2 (HOSPITAL ORDER, [facility])  URINE CULTURE  CULTURE, BLOOD (ROUTINE X 2)  CULTURE, BLOOD (ROUTINE X 2)  LACTIC ACID, PLASMA  LACTIC ACID, PLASMA    EKG None  Radiology Ct Abdomen Pelvis W Contrast  Result Date: 01/11/2019 CLINICAL DATA:  Acute generalized abdominal pain, most prominent in the lower abdomen. EXAM: CT ABDOMEN AND PELVIS WITH CONTRAST TECHNIQUE: Multidetector CT imaging of the abdomen and pelvis was performed using the standard protocol following bolus administration of intravenous contrast. CONTRAST:  58mL OMNIPAQUE IOHEXOL 300 MG/ML  SOLN COMPARISON:  12/27/2018 CT abdomen/pelvis. FINDINGS: Lower  chest: Mild patchy ground-glass opacities in right middle lobe, new. Coronary atherosclerosis. Hepatobiliary: Normal liver size.  No liver mass. Normal gallbladder with no radiopaque cholelithiasis. No biliary ductal dilatation. Pancreas: Normal, with no mass or duct dilation. Spleen: Normal size. No mass. Adrenals/Urinary Tract: Normal adrenals. Punctate nonobstructing lower right renal stone. Subcentimeter hypodense renal cortical lesions in both kidneys are too small to characterize and require no follow-up. No hydronephrosis. Normal bladder. Stomach/Bowel: Moderate hiatal hernia. Otherwise normal nondistended stomach. Large duodenal diverticulum anterior to the descending duodenum. Normal caliber small bowel with no small bowel wall thickening. Appendix not discretely visualized. No pericecal inflammatory changes. Moderate diffuse colonic diverticulosis. No large bowel wall thickening or significant pericolonic fat stranding. Vascular/Lymphatic: Atherosclerotic nonaneurysmal abdominal aorta. Patent portal, splenic, hepatic and renal veins. Retroaortic left renal vein. No pathologically enlarged lymph nodes in the abdomen or pelvis. Reproductive: Status post hysterectomy, with no abnormal findings at the vaginal cuff. No adnexal mass. Other: No pneumoperitoneum, ascites or focal fluid collection. Musculoskeletal: No aggressive appearing focal osseous lesions. Multiple intact appearing pins in the left femoral neck. Diffuse osteopenia. Marked thoracolumbar spondylosis. IMPRESSION: 1. No acute abnormality. No evidence of bowel obstruction or acute bowel inflammation. Moderate diffuse colonic diverticulosis, with no evidence of acute diverticulitis. 2. Moderate hiatal hernia. 3. Nonspecific mild patchy ground-glass opacities in the right middle lobe, new, probably infectious or inflammatory. 4. Punctate nonobstructing lower right renal stone. 5. Coronary atherosclerosis. 6. Aortic Atherosclerosis (ICD10-I70.0).  Electronically Signed   By: Delbert Phenix M.D.   On: 01/11/2019 19:13    Procedures Procedures (including critical care time)  Medications Ordered in ED Medications  acetaminophen (TYLENOL) 325 MG tablet (  Not Given 01/11/19 1839)  sodium chloride 0.9 % bolus 500 mL (500 mLs Intravenous New Bag/Given 01/11/19 1812)  iohexol (OMNIPAQUE) 300 MG/ML solution 100 mL (75 mLs Intravenous Contrast Given 01/11/19 1816)  acetaminophen (TYLENOL) tablet 1,000 mg (1,000 mg Oral Given 01/11/19 1838)     Initial Impression / Assessment and Plan / ED Course  I have reviewed the triage vital signs and the nursing notes.  Pertinent labs & imaging results that were available during my care of the patient were reviewed by me and considered in my medical decision making (see chart for details).  85 year old female presenting with returning abdominal pain after recent admission for colitis.  On arrival afebrile with normal vitals.  She also reports development of dysuria and urinary frequency.  She denies fevers, denies chest pain or shortness of breath.  On exam lungs are clear with some decreased breath sounds in bilateral bases.  Patient has abdominal tenderness in the suprapubic and right lower quadrant without guarding.  She has had a few loose stools since Monday, no diarrhea here in the emergency department.  Concern for potential recurrence of patient's colitis versus UTI.  Will get abdominal labs, lactic acid, urinalysis and culture, as well as CT abdomen pelvis to assess for recurrent colitis.  If patient has episodes of diarrhea here we will send GI pathogen panel and C. Difficile.  Labs show no leukocytosis, stable hemoglobin, creatinine of 1.02, slightly up from baseline of 0.7, slight increase in BUN suggestive of dehydration.  Glucose of 134, normal LFTs.  Lipase minimally elevated at 62.  Urinalysis with clear signs of infection, positive nitrates, leukocytes present, greater than 50 WBCs and many  bacteria, urine culture collected.  Lactic acid is not elevated, but given infection blood culture sent as well.  CT abdomen pelvis does not show recurrence of colitis, there is no acute abnormality within the abdomen, no evidence of bowel inflammation  or obstruction.  Groundglass opacities noted in the right middle lobe which are new from recent admission, this is likely infectious versus inflammatory.  Given patient's recent hospital admission where she was on Rocephin and Flagyl, will discuss antibiotic regimen with pharmacy.  Case discussed with pharmacy, given recent antibiotics, now with UTI and potential new pneumonia, they recommend treating with cefepime, Vanco and Flagyl until cultures return.  We will plan for hospital admission.  Case discussed with patient and daughter who expressed understanding and agreement.  Case discussed with Dr. Clyde LundborgNiu with try at hospitalist who will see and admit the patient.  Final Clinical Impressions(s) / ED Diagnoses   Final diagnoses:  Acute cystitis without hematuria  Pneumonia of right middle lobe due to infectious organism  Lower abdominal pain    ED Discharge Orders    None       Legrand RamsFord, Mehran Guderian N, PA-C 01/11/19 2323    Tegeler, Canary Brimhristopher J, MD 01/12/19 [phone]

## 2019-01-11 NOTE — Care Management (Signed)
This is a no charge note   Transfer from [facility] per PA, Marijean Bravo  85 year old lady with past medical history of hypertension, anxiety, who presents with abdominal pain, diarrhea, dysuria.  Patient was recently hospitalized from 9/17-9/19 due to colitis.  Patient was discharged on cefdinir and Flagyl at stable condition.  Per ED physician, her abdominal pain is located in the suprapubic and left lower quadrant.  Patient does not have respiratory symptoms.  Patient was found to have positive urinalysis (cloudy appearance, moderate amount of leukocyte, many bacteria, WBC 50), pending COVID-19 test, lactic acid 1.0, lipase 62, WBC 10.2, mild AKI with creatinine 1.02, BUN 33 (creatinine 0.77 on 12/28/2018), temperature normal, blood pressure 178/77, heart rate 72, RR 17, oxygen saturation 99% on room air. Pt was given 1 dose of vancomycin, cefepime and Flagyl in ED.  Patient is placed on MedSurg bed for observation under PUI. Pending Covid19 test.  CT-abdomen/pelvis: 1. No acute abnormality. No evidence of bowel obstruction or acute bowel inflammation. Moderate diffuse colonic diverticulosis, with no evidence of acute diverticulitis. 2. Moderate hiatal hernia. 3. Nonspecific mild patchy ground-glass opacities in the right middle lobe, new, probably infectious or inflammatory. 4. Punctate nonobstructing lower right renal stone. 5. Coronary atherosclerosis. 6. Aortic Atherosclerosis (ICD10-I70.0).   Please call manager of [facility] at [phone] when pt arrives to floor   Ivor Costa, MD  [facility]   If 7PM-7AM, please contact night-coverage [email or web address] Password [email or web address] 01/11/2019, 9:00 PM

## 2019-01-11 NOTE — ED Triage Notes (Signed)
PT sent by her MD due to abdominal pain. MD concerned she need to be readmitted for colitis.

## 2019-01-12 DIAGNOSIS — R1032 Left lower quadrant pain: Secondary | ICD-10-CM | POA: Diagnosis not present

## 2019-01-12 DIAGNOSIS — H9190 Unspecified hearing loss, unspecified ear: Secondary | ICD-10-CM | POA: Diagnosis present

## 2019-01-12 DIAGNOSIS — E860 Dehydration: Secondary | ICD-10-CM | POA: Diagnosis present

## 2019-01-12 DIAGNOSIS — Z7982 Long term (current) use of aspirin: Secondary | ICD-10-CM | POA: Diagnosis not present

## 2019-01-12 DIAGNOSIS — Z880 Allergy status to penicillin: Secondary | ICD-10-CM | POA: Diagnosis not present

## 2019-01-12 DIAGNOSIS — I10 Essential (primary) hypertension: Secondary | ICD-10-CM | POA: Diagnosis present

## 2019-01-12 DIAGNOSIS — Z66 Do not resuscitate: Secondary | ICD-10-CM | POA: Diagnosis present

## 2019-01-12 DIAGNOSIS — I480 Paroxysmal atrial fibrillation: Secondary | ICD-10-CM | POA: Diagnosis present

## 2019-01-12 DIAGNOSIS — N390 Urinary tract infection, site not specified: Secondary | ICD-10-CM | POA: Diagnosis present

## 2019-01-12 DIAGNOSIS — R197 Diarrhea, unspecified: Secondary | ICD-10-CM | POA: Diagnosis present

## 2019-01-12 DIAGNOSIS — D539 Nutritional anemia, unspecified: Secondary | ICD-10-CM | POA: Diagnosis present

## 2019-01-12 DIAGNOSIS — R1030 Lower abdominal pain, unspecified: Secondary | ICD-10-CM | POA: Diagnosis present

## 2019-01-12 DIAGNOSIS — F419 Anxiety disorder, unspecified: Secondary | ICD-10-CM | POA: Diagnosis present

## 2019-01-12 DIAGNOSIS — Z79899 Other long term (current) drug therapy: Secondary | ICD-10-CM | POA: Diagnosis not present

## 2019-01-12 DIAGNOSIS — N3000 Acute cystitis without hematuria: Secondary | ICD-10-CM | POA: Diagnosis present

## 2019-01-12 DIAGNOSIS — I4891 Unspecified atrial fibrillation: Secondary | ICD-10-CM | POA: Diagnosis not present

## 2019-01-12 DIAGNOSIS — Z20828 Contact with and (suspected) exposure to other viral communicable diseases: Secondary | ICD-10-CM | POA: Diagnosis present

## 2019-01-12 DIAGNOSIS — Z882 Allergy status to sulfonamides status: Secondary | ICD-10-CM | POA: Diagnosis not present

## 2019-01-12 DIAGNOSIS — Z90710 Acquired absence of both cervix and uterus: Secondary | ICD-10-CM | POA: Diagnosis not present

## 2019-01-12 DIAGNOSIS — B9620 Unspecified Escherichia coli [E. coli] as the cause of diseases classified elsewhere: Secondary | ICD-10-CM | POA: Diagnosis present

## 2019-01-12 DIAGNOSIS — N179 Acute kidney failure, unspecified: Secondary | ICD-10-CM

## 2019-01-12 DIAGNOSIS — Z681 Body mass index (BMI) 19 or less, adult: Secondary | ICD-10-CM | POA: Diagnosis not present

## 2019-01-12 DIAGNOSIS — Z1623 Resistance to quinolones and fluoroquinolones: Secondary | ICD-10-CM | POA: Diagnosis present

## 2019-01-12 DIAGNOSIS — Z888 Allergy status to other drugs, medicaments and biological substances status: Secondary | ICD-10-CM | POA: Diagnosis not present

## 2019-01-12 DIAGNOSIS — E43 Unspecified severe protein-calorie malnutrition: Secondary | ICD-10-CM | POA: Diagnosis present

## 2019-01-12 DIAGNOSIS — Z881 Allergy status to other antibiotic agents status: Secondary | ICD-10-CM | POA: Diagnosis not present

## 2019-01-12 DIAGNOSIS — J189 Pneumonia, unspecified organism: Secondary | ICD-10-CM | POA: Diagnosis present

## 2019-01-12 DIAGNOSIS — E611 Iron deficiency: Secondary | ICD-10-CM | POA: Diagnosis present

## 2019-01-12 LAB — BASIC METABOLIC PANEL
Anion gap: 7 (ref 5–15)
BUN: 23 mg/dL (ref 8–23)
CO2: 20 mmol/L — ABNORMAL LOW (ref 22–32)
Calcium: 9 mg/dL (ref 8.9–10.3)
Chloride: 112 mmol/L — ABNORMAL HIGH (ref 98–111)
Creatinine, Ser: 0.88 mg/dL (ref 0.44–1.00)
GFR calc Af Amer: >60 mL/min (ref >60)
GFR calc non Af Amer: 55 mL/min — ABNORMAL LOW (ref >60)
Glucose, Bld: 119 mg/dL — ABNORMAL HIGH (ref 70–99)
Potassium: 3.4 mmol/L — ABNORMAL LOW (ref 3.5–5.1)
Sodium: 139 mmol/L (ref 135–145)

## 2019-01-12 LAB — CBC
HCT: 30.3 % — ABNORMAL LOW (ref 36.0–46.0)
Hemoglobin: 9.2 g/dL — ABNORMAL LOW (ref 12.0–15.0)
MCH: 31.1 pg (ref 26.0–34.0)
MCHC: 30.4 g/dL (ref 30.0–36.0)
MCV: 102.4 fL — ABNORMAL HIGH (ref 80.0–100.0)
Platelets: 509 10*3/uL — ABNORMAL HIGH (ref 150–400)
RBC: 2.96 MIL/uL — ABNORMAL LOW (ref 3.87–5.11)
RDW: 14.5 % (ref 11.5–15.5)
WBC: 14.8 10*3/uL — ABNORMAL HIGH (ref 4.0–10.5)
nRBC: 0 % (ref 0.0–0.2)

## 2019-01-12 LAB — C-REACTIVE PROTEIN: CRP: 1.4 mg/dL — ABNORMAL HIGH (ref <1.0)

## 2019-01-12 LAB — PROCALCITONIN: Procalcitonin: 0.24 ng/mL

## 2019-01-12 MED ORDER — MORPHINE SULFATE (PF) 2 MG/ML IV SOLN
0.5000 mg | INTRAVENOUS | Status: DC | PRN
  Administered 2019-01-12: 0.5 mg via INTRAVENOUS
  Filled 2019-01-12: qty 1

## 2019-01-12 MED ORDER — SODIUM CHLORIDE (HYPERTONIC) 2 % OP SOLN: 1.0000 [drp] | OPHTHALMIC | Status: DC | PRN

## 2019-01-12 MED ORDER — ACETAMINOPHEN 325 MG PO TABS: 650.0000 mg | ORAL_TABLET | Freq: Four times a day (QID) | ORAL | Status: DC | PRN

## 2019-01-12 MED ORDER — ACETAMINOPHEN 650 MG RE SUPP: 650.0000 mg | Freq: Four times a day (QID) | RECTAL | Status: DC | PRN

## 2019-01-12 MED ORDER — SODIUM CHLORIDE 0.9 % IV SOLN
2.0000 g | Freq: Every day | INTRAVENOUS | Status: DC
  Administered 2019-01-12 – 2019-01-13 (×2): 2 g via INTRAVENOUS
  Filled 2019-01-12 (×2): qty 2
  Filled 2019-01-12 (×2): qty 20

## 2019-01-12 MED ORDER — METRONIDAZOLE IN NACL 5-0.79 MG/ML-% IV SOLN
500.0000 mg | Freq: Three times a day (TID) | INTRAVENOUS | Status: DC
  Administered 2019-01-12: 05:00:00 500 mg via INTRAVENOUS
  Filled 2019-01-12: qty 100

## 2019-01-12 MED ORDER — FOLIC ACID 1 MG PO TABS
1.0000 mg | ORAL_TABLET | Freq: Every evening | ORAL | Status: DC
  Administered 2019-01-12 – 2019-01-14 (×3): 1 mg via ORAL
  Filled 2019-01-12 (×3): qty 1

## 2019-01-12 MED ORDER — METOPROLOL SUCCINATE ER 25 MG PO TB24
12.5000 mg | ORAL_TABLET | Freq: Two times a day (BID) | ORAL | Status: DC
  Administered 2019-01-12 – 2019-01-15 (×8): 12.5 mg via ORAL
  Filled 2019-01-12 (×8): qty 1

## 2019-01-12 MED ORDER — SODIUM CHLORIDE 0.9 % IV SOLN
INTRAVENOUS | Status: DC
  Administered 2019-01-12: 04:00:00 via INTRAVENOUS

## 2019-01-12 MED ORDER — SODIUM CHLORIDE 0.9 % IV SOLN
INTRAVENOUS | Status: AC
  Administered 2019-01-12: 11:00:00 via INTRAVENOUS

## 2019-01-12 MED ORDER — PREDNISOLONE ACETATE 1 % OP SUSP
1.0000 [drp] | Freq: Every day | OPHTHALMIC | Status: DC
  Administered 2019-01-12 – 2019-01-14 (×3): 1 [drp] via OPHTHALMIC
  Filled 2019-01-12: qty 5

## 2019-01-12 MED ORDER — ALPRAZOLAM 0.25 MG PO TABS: 0.2500 mg | ORAL_TABLET | Freq: Two times a day (BID) | ORAL | Status: DC | PRN

## 2019-01-12 MED ORDER — ONDANSETRON HCL 4 MG/2ML IJ SOLN
4.0000 mg | Freq: Three times a day (TID) | INTRAMUSCULAR | Status: DC | PRN
  Administered 2019-01-13 – 2019-01-14 (×2): 4 mg via INTRAVENOUS
  Filled 2019-01-12 (×2): qty 2

## 2019-01-12 MED ORDER — AMLODIPINE BESYLATE 5 MG PO TABS
5.0000 mg | ORAL_TABLET | Freq: Every day | ORAL | Status: DC
  Administered 2019-01-12 – 2019-01-15 (×4): 5 mg via ORAL
  Filled 2019-01-12 (×4): qty 1

## 2019-01-12 MED ORDER — OLOPATADINE HCL 0.1 % OP SOLN
1.0000 [drp] | Freq: Two times a day (BID) | OPHTHALMIC | Status: DC
  Administered 2019-01-12 – 2019-01-15 (×8): 1 [drp] via OPHTHALMIC
  Filled 2019-01-12: qty 5

## 2019-01-12 MED ORDER — ASPIRIN EC 81 MG PO TBEC
81.0000 mg | DELAYED_RELEASE_TABLET | Freq: Every day | ORAL | Status: DC
  Administered 2019-01-12 – 2019-01-15 (×4): 81 mg via ORAL
  Filled 2019-01-12 (×4): qty 1

## 2019-01-12 MED ORDER — ENOXAPARIN SODIUM 30 MG/0.3ML ~~LOC~~ SOLN
30.0000 mg | Freq: Every day | SUBCUTANEOUS | Status: DC
  Administered 2019-01-12 – 2019-01-15 (×4): 30 mg via SUBCUTANEOUS
  Filled 2019-01-12 (×4): qty 0.3

## 2019-01-12 MED ORDER — HYDRALAZINE HCL 25 MG PO TABS: 25.0000 mg | ORAL_TABLET | Freq: Three times a day (TID) | ORAL | Status: DC | PRN

## 2019-01-12 NOTE — Evaluation (Signed)
Clinical/Bedside Swallow Evaluation Patient Details  Name: Lisa Freeman MRN: [id number] Date of Birth: Aug 09, 1918  Today's Date: 01/12/2019 Time: SLP Start Time (ACUTE ONLY): 1505 SLP Stop Time (ACUTE ONLY): 1525 SLP Time Calculation (min) (ACUTE ONLY): 20 min  Past Medical History:  Past Medical History:  Diagnosis Date  . Hypertension    Past Surgical History:  Past Surgical History:  Procedure Laterality Date  . ABDOMINAL HYSTERECTOMY    . APPENDECTOMY    . BACK SURGERY    . CYSTOSCOPY W/ RETROGRADES Left 11/06/2012   Procedure: CYSTOSCOPY WITH RETROGRADE PYELOGRAM;  Surgeon: Hanley Ben, MD;  Location: [place];  Service: Urology;  Laterality: Left;  . CYSTOSCOPY W/ URETERAL STENT REMOVAL Left 11/06/2012   Procedure: CYSTOSCOPY WITH STENT REMOVAL;  Surgeon: Hanley Ben, MD;  Location: [place];  Service: Urology;  Laterality: Left;  . CYSTOSCOPY WITH RETROGRADE PYELOGRAM, URETEROSCOPY AND STENT PLACEMENT Left 10/25/2012   Procedure: CYSTOSCOPY/LEFT RETROGRADE PYELOGRAM/PLACEMENT LEFT URETERAL STENT;  Surgeon: Hanley Ben, MD;  Location: [facility];  Service: Urology;  Laterality: Left;  CYSTOSCOPY/LEFT RETROGRADE PYELOGRAM/URETEROSCOPY/PLACEMENT URETERAL STENT  . CYSTOSCOPY WITH STENT PLACEMENT Left 11/06/2012   Procedure: CYSTOSCOPY WITH STENT PLACEMENT;  Surgeon: Hanley Ben, MD;  Location: [place];  Service: Urology;  Laterality: Left;  . CYSTOSCOPY WITH URETEROSCOPY AND STENT PLACEMENT Left 11/06/2012   Procedure: CYSTOSCOPY WITH URETEROSCOPY, STONE MANIPULATION;  Surgeon: Hanley Ben, MD;  Location: [place];  Service: Urology;  Laterality: Left;  . HIP FRACTURE SURGERY Left    HPI:  85 yo female admitted 01/11/2019 with abdominal pain. PMH: HTN. AbdCT = No acute abnormality. No evidence of bowel obstruction or acute bowel inflammation. Moderate diffuse colonic diverticulosis, with no  evidence of acute diverticulitis, Moderate hiatal hernia, Nonspecific mild patchy ground-glass opacities in the RML, probably infectious or inflammatory.   Assessment / Plan / Recommendation Clinical Impression  Pt presents with adequate oral motor strength and function. Upper and lower dentures in place. Pt was able to self feed puree and solid textures. No obvious oral difficulty or overt s/s aspiration observed. Pt accepted trials of thin liquid via straw, and did not exhibit overt s/s aspiration. Will continue regular diet and thin liquids, to allow full range of po choices to facilitate adequate po intake. ST will follow briefly for assessment of diet tolerance and education. Of note, pt reports she does not like cold water. She indicates she "always drinks hot water". Pt appeared to tolerate sips of warm water via cup.    SLP Visit Diagnosis: Dysphagia, unspecified (R13.10)    Aspiration Risk  Mild aspiration risk    Diet Recommendation Regular;Thin liquid   Liquid Administration via: Cup;Straw Medication Administration: Other (Comment)(as tolerated) Supervision: Patient able to self feed;Intermittent supervision to cue for compensatory strategies Compensations: Small sips/bites;Slow rate Postural Changes: Seated upright at 90 degrees;Remain upright for at least 30 minutes after po intake    Other  Recommendations Oral Care Recommendations: Oral care QID   Follow up Recommendations 24 hour supervision/assistance      Frequency and Duration min 1 x/week  1 week;2 weeks       Prognosis Prognosis for Safe Diet Advancement: Fair Barriers to Reach Goals: Cognitive deficits      Swallow Study   General Date of Onset: 01/12/19 HPI: 85 yo female admitted 01/11/2019 with abdominal pain. PMH: HTN. AbdCT = No acute abnormality. No evidence of bowel obstruction or acute bowel inflammation. Moderate diffuse colonic diverticulosis,  with no evidence of acute diverticulitis, Moderate hiatal  hernia, Nonspecific mild patchy ground-glass opacities in the RML, probably infectious or inflammatory. Type of Study: Bedside Swallow Evaluation Previous Swallow Assessment: none Diet Prior to this Study: Regular Temperature Spikes Noted: No Respiratory Status: Room air History of Recent Intubation: No Behavior/Cognition: Alert;Cooperative;Confused;Pleasant mood Oral Cavity Assessment: Within Functional Limits Oral Care Completed by SLP: No Oral Cavity - Dentition: Dentures, top;Dentures, bottom Vision: Functional for self-feeding Self-Feeding Abilities: Able to feed self Patient Positioning: Upright in bed Baseline Vocal Quality: Normal Volitional Cough: Cognitively unable to elicit Volitional Swallow: Unable to elicit    Oral/Motor/Sensory Function Overall Oral Motor/Sensory Function: Within functional limits   Ice Chips Ice chips: Not tested   Thin Liquid Thin Liquid: Within functional limits Presentation: Straw    Nectar Thick Not tested  Honey Thick Honey Thick Liquid: Not tested   Puree Puree: Within functional limits Presentation: Self Fed;Spoon   Solid     Solid: Within functional limits Presentation: Self Fed     Joquan Lotz B. Murvin Natal, MSP, CCC-SLP Speech Language Pathologist Office: [phone]  Leigh Aurora 01/12/2019,3:28 PM

## 2019-01-12 NOTE — Progress Notes (Signed)
Pharmacy Antibiotic Note  Lisa Freeman is a 85 y.o. female admitted on 01/11/2019 with UTI, recurrent colitis.  Pharmacy has been consulted for rocephin and flagyl dosing.  Plan: Rocephin 2 gm IV q24h Flagyl 500 mg IV q8h   Height: 5\' 1"  (154.9 cm) Weight: 92 lb 3.2 oz (41.8 kg) IBW/kg (Calculated) : 47.8  Temp (24hrs), Avg:98.4 F (36.9 C), Min:98 F (36.7 C), Max:98.7 F (37.1 C)  Recent Labs  Lab 01/11/19 1652 01/11/19 2038  WBC 10.2  --   CREATININE 1.02*  --   LATICACIDVEN 1.0 0.6    Estimated Creatinine Clearance: 20.8 mL/min (A) (by C-G formula based on SCr of 1.02 mg/dL (H)).    Allergies  Allergen Reactions  . Doxycycline Shortness Of Breath  . Fluticasone Propionate Shortness Of Breath  . Moxifloxacin Shortness Of Breath  . Nitrofurantoin Monohyd Macro Shortness Of Breath  . Penicillins Shortness Of Breath    Did it involve swelling of the face/tongue/throat, SOB, or low BP? Unknown Did it involve sudden or severe rash/hives, skin peeling, or any reaction on the inside of your mouth or nose? Unknown Did you need to seek medical attention at a hospital or doctor's office? Unknown When did it last happen? If all above answers are "NO", may proceed with cephalosporin use.   . Promethazine Shortness Of Breath  . Sulfa Antibiotics Shortness Of Breath  . Trimethoprim Shortness Of Breath  . Codeine Nausea And Vomiting    Antimicrobials this admission: 10/3 cefepime >> x1 ED 10/3 rocephin >>  10/3 flagyl >>  Dose adjustments this admission:   Microbiology results:  BCx:   UCx:    Sputum:    MRSA PCR:  Thank you for allowing pharmacy to be a part of this patient's care.  Lisa Freeman 01/12/2019 3:26 AM

## 2019-01-12 NOTE — Progress Notes (Signed)
Patient seen and examined, admitted earlier this morning by Dr.Niu -85 year old female with history of hearing loss, anxiety, hypertension, recently discharged from [facility] after treatment for colitis. -Presented to the emergency room yesterday with abdominal pain, just suprapubic, patient is a very poor historian. -In the emergency room she had abnormal UA with cloudy urine, many bacteria, increased leukocytes and nitrate, negative COVID PCR. -CT abdomen pelvis was unremarkable, noted some nonspecific groundglass opacities in right middle lobe  1.  UTI, suprapubic pain,  -abnormal urinalysis with cloudy urine, many bacteria, increase leukocytes -Continue IV ceftriaxone -Having nausea, poor p.o. intake, continue gentle IV fluids today along with IV antibiotics -Follow-up urine cultures  2.  Groundglass opacities in right middle lobe -Asymptomatic from this, COVID PCR is negative x1 will repeat -Also check SLP evaluation to rule out aspiration -Monitor  3.  Paroxysmal atrial fibrillation -Rate controlled, continue metoprolol -She is in sinus rhythm at this time, not on anticoagulation likely due to advanced age and ambulatory dysfunction  4.  Diarrhea -Possibly antibiotic induced, recently treated for colitis, CT did not show evidence of colitis -Stop antibiotics and monitor -Diarrhea appears to have resolved at this time  5.  Mild AKI -Resolved with hydration, hold lisinopril -Continue gentle IV fluids today due to poor oral intake  Ambulate, out of bed, physical therapy evaluation -Home tomorrow if stable  Domenic Polite, MD

## 2019-01-12 NOTE — H&P (Signed)
History and Physical    Lisa Freeman QHU:[id number] DOB: January 02, 1919 DOA: 01/11/2019  Referring MD/NP/PA:   PCP: Jonathon Bellows, PA-C   Patient coming from:  The patient is coming from home.  At baseline, pt is independent for most of ADL.        Chief Complaint: abdominal pain, diarrhea, dysuria.  HPI: Lisa Freeman is a 85 y.o. female with medical history significant of hypertension, anxiety, HOH, who presents with abdominal pain, diarrhea, dysuria.  Per her daughter (I called her daughter), patient has been having diarrhea for 6 days She had 5 times of watery diarrhea in Monday, then several times each day since then.  No bloody stool.  Patient has suprapubic abdominal pain, which seems to be severe and nonradiating.  Patient does not have nausea and vomiting.  No fever or chills.  Patient does not have cough, shortness of breath or chest pain.  She has dysuria, burning on urination and increased urinary frequency recently.  No unilateral weakness. Of note, patient was recently hospitalized from 9/17-9/19 due to colitis.  Patient was discharged on cefdinir and Flagyl at stable condition.   ED Course: pt was found to have  positive urinalysis (cloudy appearance, moderate amount of leukocyte, many bacteria, WBC>50), negative COVID-19 test, lactic acid 1.0, lipase 62, WBC 10.2, mild AKI with creatinine 1.02, BUN 33 (creatinine 0.77 on 12/28/2018), temperature normal, blood pressure 178/77, heart rate 72, RR 17, oxygen saturation 99% on room air. Pt was given 1 dose of vancomycin, cefepime and Flagyl in ED.  Patient is placed on MedSurg bed for observation under PUI. Pending Covid19 test.  CT-abdomen/pelvis: 1. No acute abnormality. No evidence of bowel obstruction or acute bowel inflammation. Moderate diffuse colonic diverticulosis, with no evidence of acute diverticulitis. 2. Moderate hiatal hernia. 3. Nonspecific mild patchy ground-glass opacities in the right middle lobe, new,  probably infectious or inflammatory. 4. Punctate nonobstructing lower right renal stone. 5. Coronary atherosclerosis. 6. Aortic Atherosclerosis (ICD10-I70.0).  Review of Systems:   General: no fevers, chills, no body weight gain, has poor appetite, has fatigue HEENT: no blurry vision, hearing changes or sore throat Respiratory: no dyspnea, coughing, wheezing CV: no chest pain, no palpitations GI: no nausea, vomiting, has abdominal pain, diarrhea, no constipation GU: has dysuria, burning on urination, increased urinary frequency, no hematuria  Ext: no leg edema Neuro: no unilateral weakness, numbness, or tingling, no vision change or hearing loss Skin: no rash, no skin tear. MSK: No muscle spasm, no deformity, no limitation of range of movement in spin Heme: No easy bruising.  Travel history: No recent long distant travel.  Allergy:  Allergies  Allergen Reactions   Doxycycline Shortness Of Breath   Fluticasone Propionate Shortness Of Breath   Moxifloxacin Shortness Of Breath   Nitrofurantoin Monohyd Macro Shortness Of Breath   Penicillins Shortness Of Breath    Did it involve swelling of the face/tongue/throat, SOB, or low BP? Unknown Did it involve sudden or severe rash/hives, skin peeling, or any reaction on the inside of your mouth or nose? Unknown Did you need to seek medical attention at a hospital or doctor's office? Unknown When did it last happen? If all above answers are NO, may proceed with cephalosporin use.    Promethazine Shortness Of Breath   Sulfa Antibiotics Shortness Of Breath   Trimethoprim Shortness Of Breath   Codeine Nausea And Vomiting    Past Medical History:  Diagnosis Date   Hypertension     Past Surgical History:  Procedure Laterality Date   ABDOMINAL HYSTERECTOMY     APPENDECTOMY     BACK SURGERY     CYSTOSCOPY W/ RETROGRADES Left 11/06/2012   Procedure: CYSTOSCOPY WITH RETROGRADE PYELOGRAM;  Surgeon: Lindaann Slough,  MD;  Location: [facility] [place];  Service: Urology;  Laterality: Left;   CYSTOSCOPY W/ URETERAL STENT REMOVAL Left 11/06/2012   Procedure: CYSTOSCOPY WITH STENT REMOVAL;  Surgeon: Lindaann Slough, MD;  Location: [facility] [place];  Service: Urology;  Laterality: Left;   CYSTOSCOPY WITH RETROGRADE PYELOGRAM, URETEROSCOPY AND STENT PLACEMENT Left 10/25/2012   Procedure: CYSTOSCOPY/LEFT RETROGRADE PYELOGRAM/PLACEMENT LEFT URETERAL STENT;  Surgeon: Lindaann Slough, MD;  Location: [facility];  Service: Urology;  Laterality: Left;  CYSTOSCOPY/LEFT RETROGRADE PYELOGRAM/URETEROSCOPY/PLACEMENT URETERAL STENT   CYSTOSCOPY WITH STENT PLACEMENT Left 11/06/2012   Procedure: CYSTOSCOPY WITH STENT PLACEMENT;  Surgeon: Lindaann Slough, MD;  Location: [facility] [place];  Service: Urology;  Laterality: Left;   CYSTOSCOPY WITH URETEROSCOPY AND STENT PLACEMENT Left 11/06/2012   Procedure: CYSTOSCOPY WITH URETEROSCOPY, STONE MANIPULATION;  Surgeon: Lindaann Slough, MD;  Location: [facility] [place];  Service: Urology;  Laterality: Left;   HIP FRACTURE SURGERY Left     Social History:  reports that she has never smoked. She has never used smokeless tobacco. She reports that she does not drink alcohol or use drugs.  Family History: History reviewed. No pertinent family history.  Tried to have reviewed with patient, but patient does not know any family history clearly.  Prior to Admission medications   Medication Sig Start Date End Date Taking? Authorizing Provider  acetaminophen (TYLENOL) 500 MG tablet Take 1,000 mg by mouth every 6 (six) hours as needed for moderate pain.   Yes [provider]  ALPRAZolam (XANAX) 0.5 MG tablet Take 0.25-0.5 mg by mouth 2 (two) times daily as needed for anxiety.   Yes [provider]  amLODipine (NORVASC) 5 MG tablet Take 5 mg by mouth daily.   Yes [provider]  aspirin EC 81 MG tablet Take 81 mg by mouth daily.   Yes  [provider]  folic acid (FOLVITE) 1 MG tablet Take 1 mg by mouth every evening.    Yes [provider]  lisinopril (ZESTRIL) 20 MG tablet Take 20 mg by mouth daily.   Yes [provider]  metoprolol succinate (TOPROL-XL) 25 MG 24 hr tablet Take 12.5 mg by mouth 2 (two) times daily.   Yes [provider]  Olopatadine HCl (PATADAY) 0.2 % SOLN Place 1 drop into the right eye daily.    Yes [provider]  sodium chloride (MURO 128) 2 % ophthalmic solution Place 1 drop into both eyes every 4 (four) hours as needed for eye irritation.   Yes [provider]  prednisoLONE acetate (PRED FORTE) 1 % ophthalmic suspension Place 1 drop into the right eye at bedtime.     [provider]    Physical Exam: Vitals:   01/11/19 2130 01/12/19 0019 01/12/19 0125 01/12/19 0140  BP: (!) 167/64 (!) 129/56 (!) 158/68 (!) 167/71  Pulse: 65 63 92 98  Resp: 16  16   Temp: 98 F (36.7 C)  98.7 F (37.1 C) 98.4 F (36.9 C)  TempSrc: Oral  Oral Oral  SpO2: 97% 98% 99%   Weight:   41.8 kg   Height:    (1.549 m)    General: Not in acute distress HEENT:       Eyes: PERRL, EOMI, no scleral icterus.  ENT: No discharge from the ears and nose, no pharynx injection, no tonsillar enlargement.        Neck: No JVD, no bruit, no mass felt. Heme: No neck lymph node enlargement. Cardiac: S1/S2, RRR, No murmurs, No gallops or rubs. Respiratory:  No rales, wheezing, rhonchi or rubs. GI: Soft, nondistended, has tenderness in suprapubic area, no rebound pain, no organomegaly, BS present. GU: No hematuria Ext: No pitting leg edema bilaterally. 2+DP/PT pulse bilaterally. Musculoskeletal: No joint deformities, No joint redness or warmth, no limitation of ROM in spin. Skin: No rashes.  Neuro: Alert, oriented X3, cranial nerves II-XII grossly intact, moves all extremities normally. Psych: Patient is not psychotic, no suicidal or hemocidal  ideation.  Labs on Admission: I have personally reviewed following labs and imaging studies  CBC: Recent Labs  Lab 01/11/19 1652  WBC 10.2  HGB 9.5*  HCT 31.4*  MCV 101.0*  PLT 582*   Basic Metabolic Panel: Recent Labs  Lab 01/11/19 1652  NA 136  K 4.1  CL 106  CO2 21*  GLUCOSE 134*  BUN 33*  CREATININE 1.02*  CALCIUM 9.6   GFR: Estimated Creatinine Clearance: 20.8 mL/min (A) (by C-G formula based on SCr of 1.02 mg/dL (H)). Liver Function Tests: Recent Labs  Lab 01/11/19 1652  AST 16  ALT 13  ALKPHOS 75  BILITOT 0.1*  PROT 7.1  ALBUMIN 3.4*   Recent Labs  Lab 01/11/19 1652  LIPASE 62*   No results for input(s): AMMONIA in the last 168 hours. Coagulation Profile: No results for input(s): INR, PROTIME in the last 168 hours. Cardiac Enzymes: No results for input(s): CKTOTAL, CKMB, CKMBINDEX, TROPONINI in the last 168 hours. BNP (last 3 results) No results for input(s): PROBNP in the last 8760 hours. HbA1C: No results for input(s): HGBA1C in the last 72 hours. CBG: No results for input(s): GLUCAP in the last 168 hours. Lipid Profile: No results for input(s): CHOL, HDL, LDLCALC, TRIG, CHOLHDL, LDLDIRECT in the last 72 hours. Thyroid Function Tests: No results for input(s): TSH, T4TOTAL, FREET4, T3FREE, THYROIDAB in the last 72 hours. Anemia Panel: No results for input(s): VITAMINB12, FOLATE, FERRITIN, TIBC, IRON, RETICCTPCT in the last 72 hours. Urine analysis:    Component Value Date/Time   COLORURINE YELLOW 01/11/2019 1813   APPEARANCEUR CLOUDY (A) 01/11/2019 1813   LABSPEC 1.015 01/11/2019 1813   PHURINE 6.0 01/11/2019 1813   GLUCOSEU NEGATIVE 01/11/2019 1813   HGBUR TRACE (A) 01/11/2019 1813   BILIRUBINUR NEGATIVE 01/11/2019 1813   KETONESUR NEGATIVE 01/11/2019 1813   PROTEINUR NEGATIVE 01/11/2019 1813   UROBILINOGEN 0.2 10/25/2012 1646   NITRITE POSITIVE (A) 01/11/2019 1813   LEUKOCYTESUR MODERATE (A) 01/11/2019 1813   Sepsis  Labs: (procalcitonin:4,lacticidven:4) ) Recent Results (from the past 240 hour(s))  SARS Coronavirus 2 [facility] order, Performed in [facility] hospital lab) Nasopharyngeal Nasopharyngeal Swab     Status: None   Collection Time: 01/11/19  9:31 PM   Specimen: Nasopharyngeal Swab  Result Value Ref Range Status   SARS Coronavirus 2 NEGATIVE NEGATIVE Final    Comment: (NOTE) If result is NEGATIVE SARS-CoV-2 target nucleic acids are NOT DETECTED. The SARS-CoV-2 RNA is generally detectable in upper and lower  respiratory specimens during the acute phase of infection. The lowest  concentration of SARS-CoV-2 viral copies this assay can detect is 250  copies / mL. A negative result does not preclude SARS-CoV-2 infection  and should not be used as the sole basis for treatment or other  patient  management decisions.  A negative result may occur with  improper specimen collection / handling, submission of specimen other  than nasopharyngeal swab, presence of viral mutation(s) within the  areas targeted by this assay, and inadequate number of viral copies  (<250 copies / mL). A negative result must be combined with clinical  observations, patient history, and epidemiological information. If result is POSITIVE SARS-CoV-2 target nucleic acids are DETECTED. The SARS-CoV-2 RNA is generally detectable in upper and lower  respiratory specimens dur ing the acute phase of infection.  Positive  results are indicative of active infection with SARS-CoV-2.  Clinical  correlation with patient history and other diagnostic information is  necessary to determine patient infection status.  Positive results do  not rule out bacterial infection or co-infection with other viruses. If result is PRESUMPTIVE POSTIVE SARS-CoV-2 nucleic acids MAY BE PRESENT.   A presumptive positive result was obtained on the submitted specimen  and confirmed on repeat testing.  While 2019 novel coronavirus  (SARS-CoV-2)  nucleic acids may be present in the submitted sample  additional confirmatory testing may be necessary for epidemiological  and / or clinical management purposes  to differentiate between  SARS-CoV-2 and other Sarbecovirus currently known to infect humans.  If clinically indicated additional testing with an alternate test  methodology [phone]([id number]) is advised. The SARS-CoV-2 RNA is generally  detectable in upper and lower respiratory sp ecimens during the acute  phase of infection. The expected result is Negative. Fact Sheet for Patients:  BoilerBrush.com.cyhttps://www.fda.gov/media/136312/download Fact Sheet for Healthcare Providers: https://pope.com/https://www.fda.gov/media/136313/download This test is not yet approved or cleared by the [place] FDA and has been authorized for detection and/or diagnosis of SARS-CoV-2 by FDA under an Emergency Use Authorization (EUA).  This EUA will remain in effect (meaning this test can be used) for the duration of the COVID-19 declaration under Section 564(b)(1) of the Act, 21 U.S.C. section 360bbb-3(b)(1), unless the authorization is terminated or revoked sooner. Performed at [facility] [place], [address] [place], [place] [address]      Radiological Exams on Admission: Ct Abdomen Pelvis W Contrast  Result Date: 01/11/2019 CLINICAL DATA:  Acute generalized abdominal pain, most prominent in the lower abdomen. EXAM: CT ABDOMEN AND PELVIS WITH CONTRAST TECHNIQUE: Multidetector CT imaging of the abdomen and pelvis was performed using the standard protocol following bolus administration of intravenous contrast. CONTRAST:  75mL OMNIPAQUE IOHEXOL 300 MG/ML  SOLN COMPARISON:  12/27/2018 CT abdomen/pelvis. FINDINGS: Lower chest: Mild patchy ground-glass opacities in right middle lobe, new. Coronary atherosclerosis. Hepatobiliary: Normal liver size. No liver mass. Normal gallbladder with no radiopaque cholelithiasis. No biliary ductal dilatation. Pancreas: Normal, with no mass or  duct dilation. Spleen: Normal size. No mass. Adrenals/Urinary Tract: Normal adrenals. Punctate nonobstructing lower right renal stone. Subcentimeter hypodense renal cortical lesions in both kidneys are too small to characterize and require no follow-up. No hydronephrosis. Normal bladder. Stomach/Bowel: Moderate hiatal hernia. Otherwise normal nondistended stomach. Large duodenal diverticulum anterior to the descending duodenum. Normal caliber small bowel with no small bowel wall thickening. Appendix not discretely visualized. No pericecal inflammatory changes. Moderate diffuse colonic diverticulosis. No large bowel wall thickening or significant pericolonic fat stranding. Vascular/Lymphatic: Atherosclerotic nonaneurysmal abdominal aorta. Patent portal, splenic, hepatic and renal veins. Retroaortic left renal vein. No pathologically enlarged lymph nodes in the abdomen or pelvis. Reproductive: Status post hysterectomy, with no abnormal findings at the vaginal cuff. No adnexal mass. Other: No pneumoperitoneum, ascites or focal fluid collection. Musculoskeletal: No aggressive appearing focal osseous  lesions. Multiple intact appearing pins in the left femoral neck. Diffuse osteopenia. Marked thoracolumbar spondylosis. IMPRESSION: 1. No acute abnormality. No evidence of bowel obstruction or acute bowel inflammation. Moderate diffuse colonic diverticulosis, with no evidence of acute diverticulitis. 2. Moderate hiatal hernia. 3. Nonspecific mild patchy ground-glass opacities in the right middle lobe, new, probably infectious or inflammatory. 4. Punctate nonobstructing lower right renal stone. 5. Coronary atherosclerosis. 6. Aortic Atherosclerosis (ICD10-I70.0). Electronically Signed   By: Delbert Phenix M.D.   On: 01/11/2019 19:13     EKG:  Not done in ED, will get one.   Assessment/Plan Principal Problem:   UTI (urinary tract infection) Active Problems:   Hypertension   Atrial fibrillation (HCC)   Abdominal  pain   AKI (acute kidney injury) (HCC)   Anxiety   Diarrhea   UTI (urinary tract infection): Patient has atypical symptoms of UTI and a positive urinalysis.  No fever or leukocytosis.  No sepsis.  Hemodynamically stable.  -Placed on MedSurg bed for observation - IV Rocephin - Add Flagyl to cover possible intra-abdominal infection (patient received 1 dose of vancomycin, cefepime and Flagyl in ED) -  Follow-up blood culture and urine culture  HTN:  -Continue home medications: Amlodipine, metoprolol -Hold lisinopril due to AKI -oral hydralazine prn  Atrial Fibrillation: CHA2DS2-VASc Score is 4, needs oral anticoagulation, but pt is not on AC,sure sure why she is not on AC. Heart rate is well controlled. -continue metoprolol   Abdominal pain and diarrhea: Her abdominal pain is located in the suprapubic area, likely due to UTI.  Etiology for diarrhea is not clear.  Patient had a recent colitis, but CT scan did not show evidence of colitis. -Check C. difficile PCR, GI pathogen panel - Started Flagyl empirically  AKI (acute kidney injury) (HCC): mild. Cre 1.02, BUN 33.  Likely due to UTI and dehydration. -IV fluid: 500 cc normal saline, then 75 cc/h - Hold lisinopril  Anxiety: -Continue home Xanax   DVT ppx: SQ Lovenox Code Status: DNR per her daughter Family Communication: Yes, patient's daughter by phone   Disposition Plan:  Anticipate discharge back to previous home environment Consults called:  none Admission status:  medical floor/obs    Date of Service 01/12/2019    Lorretta Harp [facility]   If 7PM-7AM, please contact night-coverage [email or web address] Password [email or web address] 01/12/2019, 3:26 AM

## 2019-01-12 NOTE — Progress Notes (Signed)
Rx Brief note: Lovenox  Wt = 41.8 kg, CrCl~21 ml/min  Rx adjusted Lovenox to 30 mg daily in pt with wt < 45 kg and CrCl<30 ml/min  Thanks [name] 01/12/2019 3:09 AM

## 2019-01-12 NOTE — Progress Notes (Signed)
Called daughter, Wilmer Floor, to provide updates and inform her about medications and plan of care.

## 2019-01-13 LAB — BASIC METABOLIC PANEL
Anion gap: 4 — ABNORMAL LOW (ref 5–15)
BUN: 18 mg/dL (ref 8–23)
CO2: 20 mmol/L — ABNORMAL LOW (ref 22–32)
Calcium: 8.5 mg/dL — ABNORMAL LOW (ref 8.9–10.3)
Chloride: 116 mmol/L — ABNORMAL HIGH (ref 98–111)
Creatinine, Ser: 0.88 mg/dL (ref 0.44–1.00)
GFR calc Af Amer: >60 mL/min (ref >60)
GFR calc non Af Amer: 55 mL/min — ABNORMAL LOW (ref >60)
Glucose, Bld: 94 mg/dL (ref 70–99)
Potassium: 3.6 mmol/L (ref 3.5–5.1)
Sodium: 140 mmol/L (ref 135–145)

## 2019-01-13 LAB — IRON AND TIBC
Iron: 34 ug/dL (ref 28–170)
Saturation Ratios: 15 % (ref 10.4–31.8)
TIBC: 228 ug/dL — ABNORMAL LOW (ref 250–450)
UIBC: 194 ug/dL

## 2019-01-13 LAB — CBC
HCT: 25.7 % — ABNORMAL LOW (ref 36.0–46.0)
Hemoglobin: 7.6 g/dL — ABNORMAL LOW (ref 12.0–15.0)
MCH: 30.9 pg (ref 26.0–34.0)
MCHC: 29.6 g/dL — ABNORMAL LOW (ref 30.0–36.0)
MCV: 104.5 fL — ABNORMAL HIGH (ref 80.0–100.0)
Platelets: 341 10*3/uL (ref 150–400)
RBC: 2.46 MIL/uL — ABNORMAL LOW (ref 3.87–5.11)
RDW: 14.9 % (ref 11.5–15.5)
WBC: 12.4 10*3/uL — ABNORMAL HIGH (ref 4.0–10.5)
nRBC: 0 % (ref 0.0–0.2)

## 2019-01-13 LAB — FOLATE: Folate: 61.9 ng/mL (ref >5.9)

## 2019-01-13 LAB — VITAMIN B12: Vitamin B-12: 478 pg/mL (ref 180–914)

## 2019-01-13 LAB — RETICULOCYTES
Immature Retic Fract: 7 % (ref 2.3–15.9)
RBC.: 2.65 MIL/uL — ABNORMAL LOW (ref 3.87–5.11)
Retic Count, Absolute: 40.3 10*3/uL (ref 19.0–186.0)
Retic Ct Pct: 1.5 % (ref 0.4–3.1)

## 2019-01-13 LAB — SARS CORONAVIRUS 2 (TAT 6-24 HRS): SARS Coronavirus 2: NEGATIVE

## 2019-01-13 LAB — FERRITIN: Ferritin: 87 ng/mL (ref 11–307)

## 2019-01-13 MED ORDER — SODIUM CHLORIDE 0.9 % IV SOLN
510.0000 mg | Freq: Once | INTRAVENOUS | Status: AC
  Administered 2019-01-13: 510 mg via INTRAVENOUS
  Filled 2019-01-13: qty 17

## 2019-01-13 NOTE — Evaluation (Signed)
Physical Therapy Evaluation Patient Details Name: Lisa Freeman MRN: [id number] DOB: 1918-11-27 Today's Date: 01/13/2019   History of Present Illness  85 yo female HOH with UTI (+) with abdominal pain from home PMH:HTN   Clinical Impression  Pt admitted as above and presenting with functional mobility limitations 2* generalized weakness, limited endurance and balance deficits.  Pt should progress to dc home with dtr and would benefit from follow up HHPT .    Follow Up Recommendations Home health PT;Supervision for mobility/OOB    Equipment Recommendations  None recommended by PT    Recommendations for Other Services       Precautions / Restrictions Precautions Precautions: Fall Restrictions Weight Bearing Restrictions: No      Mobility  Bed Mobility Overal bed mobility: Needs Assistance Bed Mobility: Supine to Sit     Supine to sit: Supervision     General bed mobility comments: able to progress to sitting eob increased time  Transfers Overall transfer level: Needs assistance Equipment used: Rolling walker (2 wheeled) Transfers: Sit to/from Stand Sit to Stand: Min assist Stand pivot transfers: Min assist       General transfer comment: pt requires cues for hand placement each time  Ambulation/Gait Ambulation/Gait assistance: Min assist Gait Distance (Feet): 30 Feet(30' twice) Assistive device: Rolling walker (2 wheeled) Gait Pattern/deviations: Wide base of support;Step-through pattern;Decreased stride length;Trunk flexed;Drifts right/left;Shuffle     General Gait Details: cues for posture and position from RW; short shuffling gait; ltd by fatigue  Stairs            Wheelchair Mobility    Modified Rankin (Stroke Patients Only)       Balance Overall balance assessment: Needs assistance Sitting-balance support: Feet supported Sitting balance-Leahy Scale: Fair Sitting balance - Comments: lost balance to L when trying to bring foot up to  adjust sock   Standing balance support: Bilateral upper extremity supported Standing balance-Leahy Scale: Poor Standing balance comment: reliant on RW for support                             Pertinent Vitals/Pain Pain Assessment: No/denies pain    Home Living Family/patient expects to be discharged to:: Private residence Living Arrangements: Children Available Help at Discharge: Family Type of Home: House Home Access: Stairs to enter     Home Layout: One level Home Equipment: Environmental consultant - 2 wheels;Bedside commode;Cane - single point Additional Comments: sponge bath only at this time    Prior Function Level of Independence: Independent with assistive device(s)         Comments: pt reports she has been at daughter's since pandemic started. amb with cane or RW--pt stated both (?)     Hand Dominance   Dominant Hand: Right    Extremity/Trunk Assessment   Upper Extremity Assessment Upper Extremity Assessment: Defer to OT evaluation    Lower Extremity Assessment Lower Extremity Assessment: Generalized weakness    Cervical / Trunk Assessment Cervical / Trunk Assessment: Kyphotic  Communication   Communication: HOH  Cognition Arousal/Alertness: Awake/alert Behavior During Therapy: WFL for tasks assessed/performed Overall Cognitive Status: Within Functional Limits for tasks assessed                                        General Comments      Exercises     Assessment/Plan    PT  Assessment Patient needs continued PT services  PT Problem List Decreased strength;Decreased activity tolerance;Decreased balance;Decreased knowledge of use of DME;Decreased mobility       PT Treatment Interventions DME instruction;Gait training;Functional mobility training;Therapeutic activities;Patient/family education;Therapeutic exercise;Balance training    PT Goals (Current goals can be found in the Care Plan section)  Acute Rehab PT Goals Patient  Stated Goal: to go back home with daughter PT Goal Formulation: With patient Time For Goal Achievement: 01/27/19 Potential to Achieve Goals: Good    Frequency Min 3X/week   Barriers to discharge        Co-evaluation PT/OT/SLP Co-Evaluation/Treatment: Yes Reason for Co-Treatment: To address functional/ADL transfers PT goals addressed during session: Mobility/safety with mobility OT goals addressed during session: Proper use of Adaptive equipment and DME       AM-PAC PT "6 Clicks" Mobility  Outcome Measure Help needed turning from your back to your side while in a flat bed without using bedrails?: A Little Help needed moving from lying on your back to sitting on the side of a flat bed without using bedrails?: A Little Help needed moving to and from a bed to a chair (including a wheelchair)?: A Little Help needed standing up from a chair using your arms (e.g., wheelchair or bedside chair)?: A Little Help needed to walk in hospital room?: A Little Help needed climbing 3-5 steps with a railing? : A Lot 6 Click Score: 17    End of Session Equipment Utilized During Treatment: Gait belt Activity Tolerance: Patient tolerated treatment well;Patient limited by fatigue Patient left: with call bell/phone within reach;in chair;with chair alarm set;with nursing/sitter in room Nurse Communication: Mobility status PT Visit Diagnosis: Difficulty in walking, not elsewhere classified (R26.2);Other abnormalities of gait and mobility (R26.89)    Time: 7482-7078 PT Time Calculation (min) (ACUTE ONLY): 29 min   Charges:   PT Evaluation $PT Eval Low Complexity: 1 Low          Mauro Kaufmann PT Acute Rehabilitation Services Pager [phone] Office [phone]   Daziyah Cogan 01/13/2019, 1:45 PM

## 2019-01-13 NOTE — Progress Notes (Signed)
PROGRESS NOTE    Lisa Freeman  SMO:[id number] DOB: Jun 30, 1918 DOA: 01/11/2019 PCP: Jonathon Bellows, PA-C  Brief Narrative: 85 year old female with history of hearing loss, anxiety, hypertension, recently discharged from [facility] after treatment for colitis. -Presented to the emergency room yesterday with abdominal pain, just suprapubic, patient is a very poor historian. -In the emergency room she had abnormal UA with cloudy urine, many bacteria, increased leukocytes and nitrate, negative COVID PCR. -CT abdomen pelvis was unremarkable, noted some nonspecific groundglass opacities in right middle lobe  Assessment & Plan:   1.  UTI, suprapubic pain,  -abnormal urinalysis with cloudy urine, many bacteria, increase leukocytes -Urine culture with 100,000 colonies of gram-negative rods, continue IV ceftriaxone, follow-up ID and sensitivities -Discontinue IV fluids today  2.  Groundglass opacities in right middle lobe -Asymptomatic from this, COVID PCR is negative x2 -SLP evaluation completed, no overt signs of aspiration -Monitor  3.  Paroxysmal atrial fibrillation -Rate controlled, continue metoprolol -She is in sinus rhythm at this time, not on anticoagulation likely due to advanced age and ambulatory dysfunction  4.  Diarrhea -Possibly antibiotic induced, recently treated for colitis, CT did not show evidence of colitis -Stop antibiotics and monitor -Diarrhea appears to have resolved at this time  5.  Mild AKI -Resolved with hydration, hold lisinopril -Continue gentle IV fluids today due to poor oral intake  6. Macrocytic anemia -Anemia panel with iron deficiency, B12 and folate are unremarkable -Since she is 85 years old and extremely frail would not pursue aggressive work-up namely bone marrow biopsy for macrocytosis, also for iron deficiency anemia will not pursue gastroenterology evaluation -Give IV iron x1 now -Add oral iron at discharge with  laxatives Ambulate, out of bed, physical therapy evaluation -Home tomorrow if stable  DVT prophylaxis: Lovenox Code Status: DNR Family Communication: No family at bedside, called and updated daughter Junious Dresser Disposition Plan: Home with home health services in 1 to 2 days  Consultants:    Antimicrobials:    Subjective: -Feels better today, abdominal discomfort and nausea improving -Has not had any further diarrhea  Objective: Vitals:   01/12/19 1333 01/12/19 2109 01/13/19 0505 01/13/19 1240  BP: (!) 113/54 122/73 (!) 147/55 (!) 134/58  Pulse: 73 70 66 85  Resp: _0 Temp: 98.8 F (37.1 C) 100 F (37.8 C) 98.7 F (37.1 C) 98.9 F (37.2 C)  TempSrc: Oral Oral Oral Oral  SpO2: 98% 96% 97% 99%  Weight:      Height:        Intake/Output Summary (Last 24 hours) at 01/13/2019 1323 Last data filed at 01/13/2019 9201 Gross per 24 hour  Intake 554.61 ml  Output 1100 ml  Net -545.39 ml   Filed Weights   01/11/19 1630 01/12/19 0125  Weight: 44.5 kg 41.8 kg    Examination:  Gen: Extremely frail, cachectic elderly female sitting up in bed, very hard of hearing, AAO x2 HEENT: PERRLA, Neck supple, no JVD Lungs: Clear anteriorly CVS: RRR,No Gallops,Rubs or new Murmurs Abd: Soft, mildly distended, nontender, bowel sounds present Extremities: No edema Skin: no new rashes Psychiatry: . Mood & affect appropriate.     Data Reviewed:   CBC: Recent Labs  Lab 01/11/19 1652 01/12/19 0410 01/13/19 0313  WBC 10.2 14.8* 12.4*  HGB 9.5* 9.2* 7.6*  HCT 31.4* 30.3* 25.7*  MCV 101.0* 102.4* 104.5*  PLT 582* 509* 007   Basic Metabolic Panel: Recent Labs  Lab 01/11/19 1652 01/12/19 0410 01/13/19 1219  NA 136 139 140  K 4.1 3.4* 3.6  CL 106 112* 116*  CO2 21* 20* 20*  GLUCOSE 134* 119* 94  BUN 33* 23 18  CREATININE 1.02* 0.88 0.88  CALCIUM 9.6 9.0 8.5*   GFR: Estimated Creatinine Clearance: 24.1 mL/min (by C-G formula based on SCr of 0.88 mg/dL). Liver  Function Tests: Recent Labs  Lab 01/11/19 1652  AST 16  ALT 13  ALKPHOS 75  BILITOT 0.1*  PROT 7.1  ALBUMIN 3.4*   Recent Labs  Lab 01/11/19 1652  LIPASE 62*   No results for input(s): AMMONIA in the last 168 hours. Coagulation Profile: No results for input(s): INR, PROTIME in the last 168 hours. Cardiac Enzymes: No results for input(s): CKTOTAL, CKMB, CKMBINDEX, TROPONINI in the last 168 hours. BNP (last 3 results) No results for input(s): PROBNP in the last 8760 hours. HbA1C: No results for input(s): HGBA1C in the last 72 hours. CBG: No results for input(s): GLUCAP in the last 168 hours. Lipid Profile: No results for input(s): CHOL, HDL, LDLCALC, TRIG, CHOLHDL, LDLDIRECT in the last 72 hours. Thyroid Function Tests: No results for input(s): TSH, T4TOTAL, FREET4, T3FREE, THYROIDAB in the last 72 hours. Anemia Panel: Recent Labs    01/13/19 0858  VITAMINB12 478  FERRITIN 87  TIBC 228*  IRON 34  RETICCTPCT 1.5   Urine analysis:    Component Value Date/Time   COLORURINE YELLOW 01/11/2019 1813   APPEARANCEUR CLOUDY (A) 01/11/2019 1813   LABSPEC 1.015 01/11/2019 1813   PHURINE 6.0 01/11/2019 1813   GLUCOSEU NEGATIVE 01/11/2019 1813   HGBUR TRACE (A) 01/11/2019 1813   BILIRUBINUR NEGATIVE 01/11/2019 1813   KETONESUR NEGATIVE 01/11/2019 1813   PROTEINUR NEGATIVE 01/11/2019 1813   UROBILINOGEN 0.2 10/25/2012 1646   NITRITE POSITIVE (A) 01/11/2019 1813   LEUKOCYTESUR MODERATE (A) 01/11/2019 1813   Sepsis Labs: _0 (procalcitonin:4,lacticidven:4)  ) Recent Results (from the past 240 hour(s))  Urine culture     Status: Abnormal (Preliminary result)   Collection Time: 01/11/19  6:13 PM   Specimen: Urine, Random  Result Value Ref Range Status   Specimen Description   Final    URINE, RANDOM Performed at [facility], [address] [place], [place] [address]    Special Requests   Final    NONE Performed at [facility], [address] [place], [place] [address]    Culture >=100,000 COLONIES/mL GRAM NEGATIVE RODS (A)  Final   Report Status PENDING  Incomplete  Culture, blood (routine x 2)     Status: None (Preliminary result)   Collection Time: 01/11/19  8:37 PM   Specimen: BLOOD LEFT HAND  Result Value Ref Range Status   Specimen Description   Final    BLOOD LEFT HAND Performed at [facility], [address] [place], [place] [address]    Special Requests   Final    BOTTLES DRAWN AEROBIC AND ANAEROBIC Blood Culture adequate volume Performed at [facility], [address] [place], [place] [address]    Culture   Final    NO GROWTH < 24 HOURS Performed at [facility], [place] [address], [place], [place] [address]    Report Status PENDING  Incomplete  Culture, blood (routine x 2)     Status: None (Preliminary result)   Collection Time: 01/11/19  8:47 PM   Specimen: BLOOD RIGHT FOREARM  Result Value Ref Range Status   Specimen Description  Final    BLOOD RIGHT FOREARM Performed at [facility], [address] [place], [place] [address]    Special Requests   Final    BOTTLES DRAWN AEROBIC AND ANAEROBIC Blood Culture adequate volume Performed at [facility], [address] [place], [place] [address]    Culture   Final    NO GROWTH < 24 HOURS Performed at [facility], [place] [address], [place], [place] [address]    Report Status PENDING  Incomplete  SARS Coronavirus 2 [facility] order, Performed in [facility] hospital lab) Nasopharyngeal Nasopharyngeal Swab     Status: None   Collection Time: 01/11/19  9:31 PM   Specimen: Nasopharyngeal Swab  Result Value Ref Range Status   SARS Coronavirus 2 NEGATIVE NEGATIVE Final    Comment: (NOTE) If result is NEGATIVE SARS-CoV-2 target nucleic acids are NOT DETECTED. The SARS-CoV-2 RNA is generally detectable in upper and lower  respiratory specimens during the acute phase of infection. The lowest    concentration of SARS-CoV-2 viral copies this assay can detect is 250  copies / mL. A negative result does not preclude SARS-CoV-2 infection  and should not be used as the sole basis for treatment or other  patient management decisions.  A negative result may occur with  improper specimen collection / handling, submission of specimen other  than nasopharyngeal swab, presence of viral mutation(s) within the  areas targeted by this assay, and inadequate number of viral copies  (<250 copies / mL). A negative result must be combined with clinical  observations, patient history, and epidemiological information. If result is POSITIVE SARS-CoV-2 target nucleic acids are DETECTED. The SARS-CoV-2 RNA is generally detectable in upper and lower  respiratory specimens dur ing the acute phase of infection.  Positive  results are indicative of active infection with SARS-CoV-2.  Clinical  correlation with patient history and other diagnostic information is  necessary to determine patient infection status.  Positive results do  not rule out bacterial infection or co-infection with other viruses. If result is PRESUMPTIVE POSTIVE SARS-CoV-2 nucleic acids MAY BE PRESENT.   A presumptive positive result was obtained on the submitted specimen  and confirmed on repeat testing.  While 2019 novel coronavirus  (SARS-CoV-2) nucleic acids may be present in the submitted sample  additional confirmatory testing may be necessary for epidemiological  and / or clinical management purposes  to differentiate between  SARS-CoV-2 and other Sarbecovirus currently known to infect humans.  If clinically indicated additional testing with an alternate test  methodology [phone]) is advised. The SARS-CoV-2 RNA is generally  detectable in upper and lower respiratory sp ecimens during the acute  phase of infection. The expected result is Negative. Fact Sheet for Patients:  StrictlyIdeas.no Fact Sheet  for Healthcare Providers: BankingDealers.co.za This test is not yet approved or cleared by the [place] FDA and has been authorized for detection and/or diagnosis of SARS-CoV-2 by FDA under an Emergency Use Authorization (EUA).  This EUA will remain in effect (meaning this test can be used) for the duration of the COVID-19 declaration under Section 564(b)(1) of the Act, 21 U.S.C. section 360bbb-3(b)(1), unless the authorization is terminated or revoked sooner. Performed at [facility], [address] [place], [place] [address]   SARS CORONAVIRUS 2 (TAT 6-24 HRS) Nasopharyngeal Nasopharyngeal Swab     Status: None   Collection Time: 01/12/19  9:22 AM   Specimen: Nasopharyngeal Swab  Result Value Ref  Range Status   SARS Coronavirus 2 NEGATIVE NEGATIVE Final    Comment: (NOTE) SARS-CoV-2 target nucleic acids are NOT DETECTED. The SARS-CoV-2 RNA is generally detectable in upper and lower respiratory specimens during the acute phase of infection. Negative results do not preclude SARS-CoV-2 infection, do not rule out co-infections with other pathogens, and should not be used as the sole basis for treatment or other patient management decisions. Negative results must be combined with clinical observations, patient history, and epidemiological information. The expected result is Negative. Fact Sheet for Patients: SugarRoll.be Fact Sheet for Healthcare Providers: https://www.woods-mathews.com/ This test is not yet approved or cleared by the [place] FDA and  has been authorized for detection and/or diagnosis of SARS-CoV-2 by FDA under an Emergency Use Authorization (EUA). This EUA will remain  in effect (meaning this test can be used) for the duration of the COVID-19 declaration under Section 56 4(b)(1) of the Act, 21 U.S.C. section 360bbb-3(b)(1), unless the authorization is terminated or revoked  sooner. Performed at [facility], [place] [address] [place], [place] [address]          Radiology Studies: Ct Abdomen Pelvis W Contrast  Result Date: 01/11/2019 CLINICAL DATA:  Acute generalized abdominal pain, most prominent in the lower abdomen. EXAM: CT ABDOMEN AND PELVIS WITH CONTRAST TECHNIQUE: Multidetector CT imaging of the abdomen and pelvis was performed using the standard protocol following bolus administration of intravenous contrast. CONTRAST:  58m OMNIPAQUE IOHEXOL 300 MG/ML  SOLN COMPARISON:  12/27/2018 CT abdomen/pelvis. FINDINGS: Lower chest: Mild patchy ground-glass opacities in right middle lobe, new. Coronary atherosclerosis. Hepatobiliary: Normal liver size. No liver mass. Normal gallbladder with no radiopaque cholelithiasis. No biliary ductal dilatation. Pancreas: Normal, with no mass or duct dilation. Spleen: Normal size. No mass. Adrenals/Urinary Tract: Normal adrenals. Punctate nonobstructing lower right renal stone. Subcentimeter hypodense renal cortical lesions in both kidneys are too small to characterize and require no follow-up. No hydronephrosis. Normal bladder. Stomach/Bowel: Moderate hiatal hernia. Otherwise normal nondistended stomach. Large duodenal diverticulum anterior to the descending duodenum. Normal caliber small bowel with no small bowel wall thickening. Appendix not discretely visualized. No pericecal inflammatory changes. Moderate diffuse colonic diverticulosis. No large bowel wall thickening or significant pericolonic fat stranding. Vascular/Lymphatic: Atherosclerotic nonaneurysmal abdominal aorta. Patent portal, splenic, hepatic and renal veins. Retroaortic left renal vein. No pathologically enlarged lymph nodes in the abdomen or pelvis. Reproductive: Status post hysterectomy, with no abnormal findings at the vaginal cuff. No adnexal mass. Other: No pneumoperitoneum, ascites or focal fluid collection. Musculoskeletal: No aggressive appearing focal  osseous lesions. Multiple intact appearing pins in the left femoral neck. Diffuse osteopenia. Marked thoracolumbar spondylosis. IMPRESSION: 1. No acute abnormality. No evidence of bowel obstruction or acute bowel inflammation. Moderate diffuse colonic diverticulosis, with no evidence of acute diverticulitis. 2. Moderate hiatal hernia. 3. Nonspecific mild patchy ground-glass opacities in the right middle lobe, new, probably infectious or inflammatory. 4. Punctate nonobstructing lower right renal stone. 5. Coronary atherosclerosis. 6. Aortic Atherosclerosis (ICD10-I70.0). Electronically Signed   By: JIlona SorrelM.D.   On: 01/11/2019 19:13        Scheduled Meds:  amLODipine  5 mg Oral Daily   aspirin EC  81 mg Oral Daily   enoxaparin (LOVENOX) injection  30 mg Subcutaneous Daily   folic acid  1 mg Oral QPM   metoprolol succinate  12.5 mg Oral BID   olopatadine  1 drop Both Eyes BID   prednisoLONE acetate  1 drop Right Eye QHS  Continuous Infusions:  sodium chloride Stopped (01/11/19 2242)   sodium chloride 100 mL (01/11/19 2247)   cefTRIAXone (ROCEPHIN)  IV 2 g (01/13/19 1235)   ferumoxytol       LOS: 1 day    Time spent: 44mn    PDomenic Polite MD [facility] Page via [email or web address], password [email or web address] After 7PM please contact night-coverage  01/13/2019, 1:23 PM

## 2019-01-13 NOTE — Evaluation (Signed)
Occupational Therapy Evaluation Patient Details Name: Lisa Freeman MRN: [id number] DOB: 11/22/1918 Today's Date: 01/13/2019    History of Present Illness 85 yo female HOH with UTI (+) with abdominal pain from home PMH:HTN    Clinical Impression   PT admitted with uti(+). Pt currently with functional limitiations due to the deficits listed below (see OT problem list).  Pt requires min (A) with RW for basic transfer. Pt will benefit from skilled OT to increase their independence and safety with adls and balance to allow discharge HHOT.     Follow Up Recommendations  Home health OT    Equipment Recommendations  None recommended by OT    Recommendations for Other Services       Precautions / Restrictions Precautions Precautions: Fall      Mobility Bed Mobility Overal bed mobility: Needs Assistance Bed Mobility: Supine to Sit     Supine to sit: Supervision     General bed mobility comments: able to progress to sitting eob increased time  Transfers Overall transfer level: Needs assistance Equipment used: Rolling walker (2 wheeled) Transfers: Sit to/from Stand Sit to Stand: Min assist         General transfer comment: pt requires cues for hand placement each time    Balance Overall balance assessment: Needs assistance           Standing balance-Leahy Scale: Poor Standing balance comment: reliant on RW for support                           ADL either performed or assessed with clinical judgement   ADL Overall ADL's : Needs assistance/impaired Eating/Feeding: Set up;Bed level   Grooming: Set up;Sitting       Lower Body Bathing: Minimal assistance;Sit to/from stand   Upper Body Dressing : Minimal assistance;Sitting   Lower Body Dressing: Moderate assistance;Sit to/from stand   Toilet Transfer: Minimal assistance;BSC;RW   Toileting- Architect and Hygiene: Min guard;Sitting/lateral lean Toileting - Clothing Manipulation  Details (indicate cue type and reason): pt reports some discomfort with wiping at this time     Functional mobility during ADLs: Minimal assistance;Rolling walker General ADL Comments: pt progressed from eob to [facility] then hallway ambulation with PT.      Vision Baseline Vision/History: Wears glasses Wears Glasses: At all times Additional Comments: able to read black ink on yellow paper.      Perception     Praxis      Pertinent Vitals/Pain Pain Assessment: No/denies pain     Hand Dominance Right   Extremity/Trunk Assessment Upper Extremity Assessment Upper Extremity Assessment: Overall WFL for tasks assessed   Lower Extremity Assessment Lower Extremity Assessment: Defer to PT evaluation   Cervical / Trunk Assessment Cervical / Trunk Assessment: Kyphotic   Communication Communication Communication: HOH(benefits from written communication)   Cognition Arousal/Alertness: Awake/alert Behavior During Therapy: WFL for tasks assessed/performed Overall Cognitive Status: Within Functional Limits for tasks assessed                                     General Comments       Exercises     Shoulder Instructions      Home Living Family/patient expects to be discharged to:: Private residence Living Arrangements: Children Available Help at Discharge: Family Type of Home: [employer]  Shower/Tub: Teacher, early years/pre: Handicapped height     Home Equipment: Environmental consultant - 2 wheels;Bedside commode;Cane - single point   Additional Comments: sponge bath only at this time      Prior Functioning/Environment Level of Independence: Independent with assistive device(s)        Comments: pt reports she has been at daughter's since pandemic started. amb with cane or RW--pt stated both (?)        OT Problem List: Decreased activity tolerance;Decreased knowledge of use of DME or AE;Decreased safety awareness;Impaired balance (sitting  and/or standing)      OT Treatment/Interventions: Self-care/ADL training;Therapeutic exercise;DME and/or AE instruction;Energy conservation;Therapeutic activities;Balance training;Patient/family education    OT Goals(Current goals can be found in the care plan section) Acute Rehab OT Goals Patient Stated Goal: to go back home with daughter OT Goal Formulation: With patient Time For Goal Achievement: 01/27/19 Potential to Achieve Goals: Good  OT Frequency: Min 2X/week   Barriers to D/C:    pt states "i stay in the bed until they come to get me" meaning at home with family       Co-evaluation PT/OT/SLP Co-Evaluation/Treatment: Yes Reason for Co-Treatment: To address functional/ADL transfers   OT goals addressed during session: Proper use of Adaptive equipment and DME;ADL's and self-care      AM-PAC OT "6 Clicks" Daily Activity     Outcome Measure Help from another person eating meals?: None Help from another person taking care of personal grooming?: A Little Help from another person toileting, which includes using toliet, bedpan, or urinal?: A Lot Help from another person bathing (including washing, rinsing, drying)?: A Little Help from another person to put on and taking off regular upper body clothing?: A Little Help from another person to put on and taking off regular lower body clothing?: A Little 6 Click Score: 18   End of Session Equipment Utilized During Treatment: Gait belt;Rolling walker Nurse Communication: Mobility status;Precautions  Activity Tolerance: Patient tolerated treatment well Patient left: in chair;with call bell/phone within reach;with nursing/sitter in room;with chair alarm set  OT Visit Diagnosis: Unsteadiness on feet (R26.81);Muscle weakness (generalized) (M62.81)                Time: 7494-4967 OT Time Calculation (min): 27 min Charges:  OT General Charges $OT Visit: 1 Visit OT Evaluation $OT Eval Moderate Complexity: 1 Mod   Jeri Modena,  OTR/L  Acute Rehabilitation Services Pager: [phone] Office: [phone] .   Jeri Modena 01/13/2019, 1:23 PM

## 2019-01-14 DIAGNOSIS — R1030 Lower abdominal pain, unspecified: Secondary | ICD-10-CM

## 2019-01-14 DIAGNOSIS — J189 Pneumonia, unspecified organism: Secondary | ICD-10-CM

## 2019-01-14 LAB — CBC
HCT: 25.1 % — ABNORMAL LOW (ref 36.0–46.0)
Hemoglobin: 7.9 g/dL — ABNORMAL LOW (ref 12.0–15.0)
MCH: 31.9 pg (ref 26.0–34.0)
MCHC: 31.5 g/dL (ref 30.0–36.0)
MCV: 101.2 fL — ABNORMAL HIGH (ref 80.0–100.0)
Platelets: 338 10*3/uL (ref 150–400)
RBC: 2.48 MIL/uL — ABNORMAL LOW (ref 3.87–5.11)
RDW: 14.6 % (ref 11.5–15.5)
WBC: 15 10*3/uL — ABNORMAL HIGH (ref 4.0–10.5)
nRBC: 0 % (ref 0.0–0.2)

## 2019-01-14 LAB — URINE CULTURE: Culture: >=100000 — AB

## 2019-01-14 LAB — BASIC METABOLIC PANEL
Anion gap: 7 (ref 5–15)
BUN: 17 mg/dL (ref 8–23)
CO2: 22 mmol/L (ref 22–32)
Calcium: 8.7 mg/dL — ABNORMAL LOW (ref 8.9–10.3)
Chloride: 109 mmol/L (ref 98–111)
Creatinine, Ser: 0.84 mg/dL (ref 0.44–1.00)
GFR calc Af Amer: >60 mL/min (ref >60)
GFR calc non Af Amer: 58 mL/min — ABNORMAL LOW (ref >60)
Glucose, Bld: 108 mg/dL — ABNORMAL HIGH (ref 70–99)
Potassium: 4 mmol/L (ref 3.5–5.1)
Sodium: 138 mmol/L (ref 135–145)

## 2019-01-14 MED ORDER — SENNA 8.6 MG PO TABS: 1.0000 | ORAL_TABLET | Freq: Every day | ORAL | 0 refills | Status: DC | PRN

## 2019-01-14 MED ORDER — CEFDINIR 300 MG PO CAPS
300.0000 mg | ORAL_CAPSULE | Freq: Two times a day (BID) | ORAL | Status: DC
  Administered 2019-01-14 – 2019-01-15 (×3): 300 mg via ORAL
  Filled 2019-01-14 (×4): qty 1

## 2019-01-14 MED ORDER — CEFDINIR 300 MG PO CAPS: 300.0000 mg | ORAL_CAPSULE | Freq: Two times a day (BID) | ORAL | 0 refills | Status: AC

## 2019-01-14 NOTE — Progress Notes (Signed)
PROGRESS NOTE    Lisa Freeman  OPF:[id number] DOB: 06-20-1918 DOA: 01/11/2019 PCP: Jonathon Bellows, PA-C  Brief Narrative: 85 year old female with history of hearing loss, anxiety, hypertension, recently discharged from [facility] after treatment for colitis. -Presented to the emergency room yesterday with abdominal pain, just suprapubic, patient is a very poor historian. -In the emergency room she had abnormal UA with cloudy urine, many bacteria, increased leukocytes and nitrate, negative COVID PCR. -CT abdomen pelvis was unremarkable, noted some nonspecific groundglass opacities in right middle lobe -Urine culture with E. coli resistant to ciprofloxacin otherwise pansensitive  Assessment & Plan:   1.    E. coli UTI, suprapubic pain,  -abnormal urinalysis with cloudy urine, many bacteria, increase leukocytes -Urine culture  E. coli resistant to ciprofloxacin otherwise pansensitive -Transition to oral antibiotics today -Clinically improving overall, did have a small episode of vomiting after lunch today, will observe for 1 more day, mild bump in white count as well, recheck in a.m. -PT OT eval completed, home health recommended  2.  Groundglass opacities in right middle lobe -Asymptomatic from this, COVID PCR is negative x2 -SLP evaluation completed, no overt signs of aspiration -Monitor  3.  Paroxysmal atrial fibrillation -Rate controlled, continue metoprolol -She is in sinus rhythm at this time, not on anticoagulation likely due to advanced age and ambulatory dysfunction  4.  Diarrhea -Possibly antibiotic induced, recently treated for colitis, CT did not show evidence of colitis -Stop antibiotics and monitor -Diarrhea appears to have resolved at this time  5.  Mild AKI -Resolved with hydration, hold lisinopril -Continue gentle IV fluids today due to poor oral intake  6. Macrocytic anemia -Anemia panel with iron deficiency, B12 and folate are  unremarkable -In terms of her macrocytic anemia, could have underlying MDS, given advanced age of 33 and extremely frail, malnourished status would not pursue aggressive work-up namely bone marrow biopsy etc. this was discussed with patient's daughter who agrees  -Also has a component of iron deficiency as well, given IV iron yesterday -Add oral iron therapy at discharge with laxatives  DVT prophylaxis: Lovenox Code Status: DNR Family Communication: No family at bedside, called and updated daughter Junious Dresser history  disposition Plan: Home with home health tomorrow if stable  Consultants:    Antimicrobials:    Subjective: -Feeling better overall, mild nausea early this morning -Ate couple of bites of lunch and then vomited per RN  Objective: Vitals:   01/13/19 1716 01/13/19 2127 01/14/19 0612 01/14/19 1258  BP: (!) 146/55 131/69 (!) 147/70 (!) 127/49  Pulse: 68 99 70 70  Resp:  16 18 (!) 22  Temp:  99.6 F (37.6 C) 99.1 F (37.3 C) 98.5 F (36.9 C)  TempSrc:  Oral Oral Oral  SpO2:  97% 97% 97%  Weight:      Height:        Intake/Output Summary (Last 24 hours) at 01/14/2019 1500 Last data filed at 01/13/2019 2150 Gross per 24 hour  Intake 100 ml  Output 100 ml  Net 0 ml   Filed Weights   01/11/19 1630 01/12/19 0125  Weight: 44.5 kg 41.8 kg    Examination:  Gen: elderly frail female, sitting up in bed, extremely hard of hearing, AAOx2 HEENT: PERRLA, Neck supple, no JVD Lungs: clear CVS: RRR,No Gallops,Rubs or new Murmurs Abd: Soft, nontender, nondistended, bowel sounds present Extremities: No edema Skin: no new rashes Psychiatry: . Mood & affect appropriate.     Data Reviewed:   CBC: Recent  Labs  Lab 01/11/19 1652 01/12/19 0410 01/13/19 0313 01/14/19 0358  WBC 10.2 14.8* 12.4* 15.0*  HGB 9.5* 9.2* 7.6* 7.9*  HCT 31.4* 30.3* 25.7* 25.1*  MCV 101.0* 102.4* 104.5* 101.2*  PLT 582* 509* 341 295   Basic Metabolic Panel: Recent Labs  Lab 01/11/19 1652  01/12/19 0410 01/13/19 0313 01/14/19 0358  NA 136 139 140 138  K 4.1 3.4* 3.6 4.0  CL 106 112* 116* 109  CO2 21* 20* 20* 22  GLUCOSE 134* 119* 94 108*  BUN 33* 23 18 17   CREATININE 1.02* 0.88 0.88 0.84  CALCIUM 9.6 9.0 8.5* 8.7*   GFR: Estimated Creatinine Clearance: 25.3 mL/min (by C-G formula based on SCr of 0.84 mg/dL). Liver Function Tests: Recent Labs  Lab 01/11/19 1652  AST 16  ALT 13  ALKPHOS 75  BILITOT 0.1*  PROT 7.1  ALBUMIN 3.4*   Recent Labs  Lab 01/11/19 1652  LIPASE 62*   No results for input(s): AMMONIA in the last 168 hours. Coagulation Profile: No results for input(s): INR, PROTIME in the last 168 hours. Cardiac Enzymes: No results for input(s): CKTOTAL, CKMB, CKMBINDEX, TROPONINI in the last 168 hours. BNP (last 3 results) No results for input(s): PROBNP in the last 8760 hours. HbA1C: No results for input(s): HGBA1C in the last 72 hours. CBG: No results for input(s): GLUCAP in the last 168 hours. Lipid Profile: No results for input(s): CHOL, HDL, LDLCALC, TRIG, CHOLHDL, LDLDIRECT in the last 72 hours. Thyroid Function Tests: No results for input(s): TSH, T4TOTAL, FREET4, T3FREE, THYROIDAB in the last 72 hours. Anemia Panel: Recent Labs    01/13/19 0858 01/13/19 0859  VITAMINB12 478  --   FOLATE  --  61.9  FERRITIN 87  --   TIBC 228*  --   IRON 34  --   RETICCTPCT 1.5  --    Urine analysis:    Component Value Date/Time   COLORURINE YELLOW 01/11/2019 1813   APPEARANCEUR CLOUDY (A) 01/11/2019 1813   LABSPEC 1.015 01/11/2019 1813   PHURINE 6.0 01/11/2019 1813   GLUCOSEU NEGATIVE 01/11/2019 1813   HGBUR TRACE (A) 01/11/2019 1813   BILIRUBINUR NEGATIVE 01/11/2019 1813   KETONESUR NEGATIVE 01/11/2019 1813   PROTEINUR NEGATIVE 01/11/2019 1813   UROBILINOGEN 0.2 10/25/2012 1646   NITRITE POSITIVE (A) 01/11/2019 1813   LEUKOCYTESUR MODERATE (A) 01/11/2019 1813   Sepsis Labs: @LABRCNTIP (procalcitonin:4,lacticidven:4)  ) Recent  Results (from the past 240 hour(s))  Urine culture     Status: Abnormal   Collection Time: 01/11/19  6:13 PM   Specimen: Urine, Random  Result Value Ref Range Status   Specimen Description   Final    URINE, RANDOM Performed at [facility], [address] [place], [place] [address]    Special Requests   Final    NONE Performed at [facility], [address] [place], [place] [address]    Culture >=100,000 COLONIES/mL ESCHERICHIA COLI (A)  Final   Report Status 01/14/2019 FINAL  Final   Organism ID, Bacteria ESCHERICHIA COLI (A)  Final      Susceptibility   Escherichia coli - MIC*    AMPICILLIN 8 SENSITIVE Sensitive     CEFAZOLIN <=4 SENSITIVE Sensitive     CEFTRIAXONE <=1 SENSITIVE Sensitive     CIPROFLOXACIN >=4 RESISTANT Resistant     GENTAMICIN <=1 SENSITIVE Sensitive     IMIPENEM <=0.25 SENSITIVE Sensitive     NITROFURANTOIN <=16 SENSITIVE Sensitive  TRIMETH/SULFA <=20 SENSITIVE Sensitive     AMPICILLIN/SULBACTAM 4 SENSITIVE Sensitive     PIP/TAZO <=4 SENSITIVE Sensitive     Extended ESBL NEGATIVE Sensitive     * >=100,000 COLONIES/mL ESCHERICHIA COLI  Culture, blood (routine x 2)     Status: None (Preliminary result)   Collection Time: 01/11/19  8:37 PM   Specimen: BLOOD LEFT HAND  Result Value Ref Range Status   Specimen Description   Final    BLOOD LEFT HAND Performed at [facility], [address] [place], [place] [address]    Special Requests   Final    BOTTLES DRAWN AEROBIC AND ANAEROBIC Blood Culture adequate volume Performed at [facility], [address] [place], [place] [address]    Culture   Final    NO GROWTH 2 DAYS Performed at [facility], [place] [address] [place], [place] [address]    Report Status PENDING  Incomplete  Culture, blood (routine x 2)     Status: None (Preliminary result)   Collection Time: 01/11/19  8:47 PM   Specimen: BLOOD RIGHT FOREARM  Result Value Ref Range Status    Specimen Description   Final    BLOOD RIGHT FOREARM Performed at [facility], [address] [place], [place] [address]    Special Requests   Final    BOTTLES DRAWN AEROBIC AND ANAEROBIC Blood Culture adequate volume Performed at [facility], [address] [place], [place] [address]    Culture   Final    NO GROWTH 2 DAYS Performed at [facility], [place] [address] [place], [place] [address]    Report Status PENDING  Incomplete  SARS Coronavirus 2 [facility] order, Performed in [facility] hospital lab) Nasopharyngeal Nasopharyngeal Swab     Status: None   Collection Time: 01/11/19  9:31 PM   Specimen: Nasopharyngeal Swab  Result Value Ref Range Status   SARS Coronavirus 2 NEGATIVE NEGATIVE Final    Comment: (NOTE) If result is NEGATIVE SARS-CoV-2 target nucleic acids are NOT DETECTED. The SARS-CoV-2 RNA is generally detectable in upper and lower  respiratory specimens during the acute phase of infection. The lowest  concentration of SARS-CoV-2 viral copies this assay can detect is 250  copies / mL. A negative result does not preclude SARS-CoV-2 infection  and should not be used as the sole basis for treatment or other  patient management decisions.  A negative result may occur with  improper specimen collection / handling, submission of specimen other  than nasopharyngeal swab, presence of viral mutation(s) within the  areas targeted by this assay, and inadequate number of viral copies  (<250 copies / mL). A negative result must be combined with clinical  observations, patient history, and epidemiological information. If result is POSITIVE SARS-CoV-2 target nucleic acids are DETECTED. The SARS-CoV-2 RNA is generally detectable in upper and lower  respiratory specimens dur ing the acute phase of infection.  Positive  results are indicative of active infection with SARS-CoV-2.  Clinical  correlation with patient history and other diagnostic  information is  necessary to determine patient infection status.  Positive results do  not rule out bacterial infection or co-infection with other viruses. If result is PRESUMPTIVE POSTIVE SARS-CoV-2 nucleic acids MAY BE PRESENT.   A presumptive positive result was obtained on the submitted specimen  and confirmed on repeat testing.  While 2019 novel coronavirus  (SARS-CoV-2) nucleic acids may be  present in the submitted sample  additional confirmatory testing may be necessary for epidemiological  and / or clinical management purposes  to differentiate between  SARS-CoV-2 and other Sarbecovirus currently known to infect humans.  If clinically indicated additional testing with an alternate test  methodology [phone]) is advised. The SARS-CoV-2 RNA is generally  detectable in upper and lower respiratory sp ecimens during the acute  phase of infection. The expected result is Negative. Fact Sheet for Patients:  StrictlyIdeas.no Fact Sheet for Healthcare Providers: BankingDealers.co.za This test is not yet approved or cleared by the [place] FDA and has been authorized for detection and/or diagnosis of SARS-CoV-2 by FDA under an Emergency Use Authorization (EUA).  This EUA will remain in effect (meaning this test can be used) for the duration of the COVID-19 declaration under Section 564(b)(1) of the Act, 21 U.S.C. section 360bbb-3(b)(1), unless the authorization is terminated or revoked sooner. Performed at [facility], [address] [place], [place] [address]   SARS CORONAVIRUS 2 (TAT 6-24 HRS) Nasopharyngeal Nasopharyngeal Swab     Status: None   Collection Time: 01/12/19  9:22 AM   Specimen: Nasopharyngeal Swab  Result Value Ref Range Status   SARS Coronavirus 2 NEGATIVE NEGATIVE Final    Comment: (NOTE) SARS-CoV-2 target nucleic acids are NOT DETECTED. The SARS-CoV-2 RNA is generally detectable in upper and lower  respiratory specimens during the acute phase of infection. Negative results do not preclude SARS-CoV-2 infection, do not rule out co-infections with other pathogens, and should not be used as the sole basis for treatment or other patient management decisions. Negative results must be combined with clinical observations, patient history, and epidemiological information. The expected result is Negative. Fact Sheet for Patients: SugarRoll.be Fact Sheet for Healthcare Providers: https://www.woods-mathews.com/ This test is not yet approved or cleared by the [place] FDA and  has been authorized for detection and/or diagnosis of SARS-CoV-2 by FDA under an Emergency Use Authorization (EUA). This EUA will remain  in effect (meaning this test can be used) for the duration of the COVID-19 declaration under Section 56 4(b)(1) of the Act, 21 U.S.C. section 360bbb-3(b)(1), unless the authorization is terminated or revoked sooner. Performed at [facility], [place] [address] [place], [place] [address]          Radiology Studies: No results found.      Scheduled Meds: . amLODipine  5 mg Oral Daily  . aspirin EC  81 mg Oral Daily  . cefdinir  300 mg Oral Q12H  . enoxaparin (LOVENOX) injection  30 mg Subcutaneous Daily  . folic acid  1 mg Oral QPM  . metoprolol succinate  12.5 mg Oral BID  . olopatadine  1 drop Both Eyes BID  . prednisoLONE acetate  1 drop Right Eye QHS   Continuous Infusions: . sodium chloride 100 mL (01/11/19 2247)     LOS: 2 days    Time spent: 4mn    PDomenic Polite MD [facility] Page via [email or web address], password [email or web address] After 7PM please contact night-coverage  01/14/2019, 3:00 PM

## 2019-01-14 NOTE — Progress Notes (Signed)
Physical Therapy Treatment Patient Details Name: Lisa Freeman MRN: [id number] DOB: July 04, 1918 Today's Date: 01/14/2019    History of Present Illness 85 yo female HOH with UTI (+) with abdominal pain from home PMH:HTN     PT Comments    Pt very pleasant and alert.  Requested to use toilet.  "I can't pee in the bed with this thing" (peri wick) "I'll make a mess".  Assisted OOB.  General bed mobility comments: able to progress to sitting eob increased time and slight increased assist back to bed.  General transfer comment: pt requires cues for hand placement each time.  Assisted from bed to [facility] then back to bed.  General Gait Details: did not attempt amb due to fatigue and mild nausea/dizziness using BSC. Pt stasted she felt better after.    Follow Up Recommendations   HH PT Family assist 24/7     Equipment Recommendations    none   Recommendations for Other Services  none      Precautions / Restrictions Precautions Precautions: Fall    Mobility  Bed Mobility Overal bed mobility: Needs Assistance Bed Mobility: Supine to Sit;Sit to Supine     Supine to sit: Supervision;Min guard Sit to supine: Min guard;Min assist   General bed mobility comments: able to progress to sitting eob increased time and slight increased assist back to bed  Transfers Overall transfer level: Needs assistance Equipment used: Rolling walker (2 wheeled);None Transfers: Sit to/from Stand Sit to Stand: Min assist Stand pivot transfers: Min assist;Mod assist       General transfer comment: pt requires cues for hand placement each time.  Assisted from bed to [facility] then back to bed  Ambulation/Gait             General Gait Details: did not attempt amb due to fatigue and mild nausea/dizziness using BSC.   Stairs             Wheelchair Mobility    Modified Rankin (Stroke Patients Only)       Balance                                            Cognition  Arousal/Alertness: Awake/alert Behavior During Therapy: WFL for tasks assessed/performed Overall Cognitive Status: Within Functional Limits for tasks assessed                                 General Comments: pleasant AxO x 3 just [facility]      Exercises      General Comments        Pertinent Vitals/Pain      Home Living                      Prior Function            PT Goals (current goals can now be found in the care plan section) Acute Rehab PT Goals Patient Stated Goal: to go back home with daughter    Frequency           PT Plan      Co-evaluation              AM-PAC PT "6 Clicks" Mobility   Outcome Measure  End of Session               Time: 7169-6789 PT Time Calculation (min) (ACUTE ONLY): 15 min  Charges:  $Therapeutic Activity: 8-22 mins                     Felecia Shelling  PTA Acute  Rehabilitation Services Pager      [phone] Office      [phone]

## 2019-01-14 NOTE — TOC Progression Note (Signed)
Transition of Care [facility]) - Progression Note    Patient Details  Name: Lisa Freeman MRN: [id number] Date of Birth: 09/19/1918  [facility] of [facility]) CM/SW Contact  Purcell Mouton, RN Phone Number: 01/14/2019, 12:34 PM  Clinical Narrative:     Pt is active with [place]. Referral was given to [facility] in house rep.        Expected Discharge Plan and Services           Expected Discharge Date: 01/14/19                                     Social Determinants of Health (SDOH) Interventions    Readmission Risk Interventions No flowsheet data found.

## 2019-01-14 NOTE — Progress Notes (Signed)
Patient had small episode of vomiting while trying to eat lunch, states she got strangled on food, but still felt nauseated.  vomit appears as thick sputum with bits of food  PRN zofran given, will continue to monitor.

## 2019-01-15 LAB — CBC
HCT: 25.8 % — ABNORMAL LOW (ref 36.0–46.0)
Hemoglobin: 7.9 g/dL — ABNORMAL LOW (ref 12.0–15.0)
MCH: 31.2 pg (ref 26.0–34.0)
MCHC: 30.6 g/dL (ref 30.0–36.0)
MCV: 102 fL — ABNORMAL HIGH (ref 80.0–100.0)
Platelets: 285 10*3/uL (ref 150–400)
RBC: 2.53 MIL/uL — ABNORMAL LOW (ref 3.87–5.11)
RDW: 14.4 % (ref 11.5–15.5)
WBC: 7.2 10*3/uL (ref 4.0–10.5)
nRBC: 0 % (ref 0.0–0.2)

## 2019-01-15 LAB — COMPREHENSIVE METABOLIC PANEL
ALT: 8 U/L (ref 0–44)
AST: 12 U/L — ABNORMAL LOW (ref 15–41)
Albumin: 2.5 g/dL — ABNORMAL LOW (ref 3.5–5.0)
Alkaline Phosphatase: 48 U/L (ref 38–126)
Anion gap: 8 (ref 5–15)
BUN: 14 mg/dL (ref 8–23)
CO2: 23 mmol/L (ref 22–32)
Calcium: 8.8 mg/dL — ABNORMAL LOW (ref 8.9–10.3)
Chloride: 108 mmol/L (ref 98–111)
Creatinine, Ser: 0.79 mg/dL (ref 0.44–1.00)
GFR calc Af Amer: >60 mL/min (ref >60)
GFR calc non Af Amer: >60 mL/min (ref >60)
Glucose, Bld: 89 mg/dL (ref 70–99)
Potassium: 4.1 mmol/L (ref 3.5–5.1)
Sodium: 139 mmol/L (ref 135–145)
Total Bilirubin: 0.5 mg/dL (ref 0.3–1.2)
Total Protein: 5.5 g/dL — ABNORMAL LOW (ref 6.5–8.1)

## 2019-01-15 NOTE — Care Management Important Message (Signed)
Important Message  Patient Details IM Letter given to Cookie McGibboney RN to present to the Patient Name: Lisa Freeman MRN: [id number] Date of Birth: 26-Nov-1918   Medicare Important Message Given:  Yes     Lisa Freeman 01/15/2019, 9:50 AM

## 2019-01-15 NOTE — Plan of Care (Signed)
  Problem: Pain Managment: Goal: General experience of comfort will improve Outcome: Progressing   Problem: Safety: Goal: Ability to remain free from injury will improve Outcome: Progressing   Problem: Skin Integrity: Goal: Risk for impaired skin integrity will decrease Outcome: Progressing   

## 2019-01-17 LAB — CULTURE, BLOOD (ROUTINE X 2)
Culture: NO GROWTH
Culture: NO GROWTH
Special Requests: ADEQUATE
Special Requests: ADEQUATE

## 2019-01-24 NOTE — Discharge Summary (Signed)
Physician Discharge Summary  Lisa Freeman TDV:[id number] DOB: Aug 26, 1918 DOA: 01/11/2019  PCP: Jonathon Bellows, PA-C  Admit date: 01/11/2019 Discharge date: 01/15/2019  Time spent: 35 minutes  Recommendations for Outpatient Follow-up:  PCP in 1 week Home health PT OT  Discharge Diagnoses:  DO NOT RESUSCITATE E. coli UTI   Hypertension   Atrial fibrillation ([place])   Abdominal pain   AKI (acute kidney injury) ([place])   Anxiety   Diarrhea   Pneumonia of right middle lobe due to infectious organism Macrocytic anemia  Discharge Condition: Improved  Diet recommendation: Regular  Filed Weights   01/11/19 1630 01/12/19 0125  Weight: 44.5 kg 41.8 kg    History of present illness:   85 year old female with history of hearing loss, anxiety, hypertension, recently discharged from [facility] after treatment for colitis. -Presented to the emergency room yesterday with abdominal pain, just suprapubic, patient is a very poor historian. -In the emergency room she had abnormal UA with cloudy urine, many bacteria, increased leukocytes and nitrate, negative COVID PCR. -CT abdomen pelvis was unremarkable, noted some nonspecific groundglass opacities inright middle lobe  Hospital Course:    1.  E. coli UTI, suprapubic pain,  -abnormal urinalysis with cloudy urine, many bacteria, increase leukocytes -Urine culture  E. coli resistant to ciprofloxacin otherwise pansensitive -Transitioned to oral cefdinir at discharge -Clinically improving overall, symptoms have improved, tolerating diet -PT OT eval completed, home health recommended  2.Groundglass opacities in right middle lobe -Asymptomatic from this, COVID PCR is negative x2 -SLP evaluation completed, no overt signs of aspiration -No further work-up indicated at this time given advanced age and comorbidities  3.Paroxysmal atrial fibrillation -Rate controlled, continue metoprolol -She is in sinus rhythm at  this time, not on anticoagulation likely due to advanced age and ambulatory dysfunction  4.Diarrhea -Possibly antibiotic induced, recently treated for colitis, CT did not show evidence of colitis -Resolved  5.Mild AKI -Resolved with hydration, holding lisinopril  6. Macrocytic anemia -Anemia panel with iron deficiency, B12 and folate are unremarkable -In terms of her macrocytic anemia, could have underlying MDS, given advanced age of 36 and extremely frail, malnourished status would not pursue aggressive work-up namely bone marrow biopsy etc. this was discussed with patient's daughter who agrees  -Also has a component of iron deficiency as well, given IV iron yesterday -Add oral iron therapy at discharge with laxatives  7.  Severe protein calorie malnutrition   Code Status: DNR  Discharge Exam: Vitals:   01/15/19 0437 01/15/19 1159  BP: (!) 148/61 (!) 149/63  Pulse: 66 71  Resp: 16 (!) 23  Temp: 99.5 F (37.5 C) 98.7 F (37.1 C)  SpO2: 94% 98%    General: AAOx2, extremely hard of hearing Cardiovascular: S1-S2/regular rate rhythm Respiratory: Clear  Discharge Instructions   Discharge Instructions    Diet - low sodium heart healthy   Complete by: As directed    Increase activity slowly   Complete by: As directed      Allergies as of 01/15/2019      Reactions   Doxycycline Shortness Of Breath   Fluticasone Propionate Shortness Of Breath   Moxifloxacin Shortness Of Breath   Nitrofurantoin Monohyd Macro Shortness Of Breath   Penicillins Shortness Of Breath   Did it involve swelling of the face/tongue/throat, SOB, or low BP? Unknown Did it involve sudden or severe rash/hives, skin peeling, or any reaction on the inside of your mouth or nose? Unknown Did you need to seek medical attention at a  hospital or doctor's office? Unknown When did it last happen? If all above answers are "NO", may proceed with cephalosporin use.   Promethazine Shortness Of  Breath   Sulfa Antibiotics Shortness Of Breath   Trimethoprim Shortness Of Breath   Codeine Nausea And Vomiting      Medication List    STOP taking these medications   lisinopril 20 MG tablet Commonly known as: ZESTRIL     TAKE these medications   acetaminophen 500 MG tablet Commonly known as: TYLENOL Take 1,000 mg by mouth every 6 (six) hours as needed for moderate pain.   ALPRAZolam 0.5 MG tablet Commonly known as: XANAX Take 0.25-0.5 mg by mouth 2 (two) times daily as needed for anxiety.   amLODipine 5 MG tablet Commonly known as: NORVASC Take 5 mg by mouth daily.   aspirin EC 81 MG tablet Take 81 mg by mouth daily.   folic acid 1 MG tablet Commonly known as: FOLVITE Take 1 mg by mouth every evening.   metoprolol succinate 25 MG 24 hr tablet Commonly known as: TOPROL-XL Take 12.5 mg by mouth 2 (two) times daily.   Pataday 0.2 % Soln Generic drug: Olopatadine HCl Place 1 drop into the right eye daily.   prednisoLONE acetate 1 % ophthalmic suspension Commonly known as: PRED FORTE Place 1 drop into the right eye at bedtime.   senna 8.6 MG Tabs tablet Commonly known as: SENOKOT Take 1 tablet (8.6 mg total) by mouth daily as needed for mild constipation.   sodium chloride 2 % ophthalmic solution Commonly known as: MURO 128 Place 1 drop into both eyes every 4 (four) hours as needed for eye irritation.     ASK your doctor about these medications   cefdinir 300 MG capsule Commonly known as: OMNICEF Take 1 capsule (300 mg total) by mouth every 12 (twelve) hours for 3 days. Ask about: Should I take this medication?      Allergies  Allergen Reactions  . Doxycycline Shortness Of Breath  . Fluticasone Propionate Shortness Of Breath  . Moxifloxacin Shortness Of Breath  . Nitrofurantoin Monohyd Macro Shortness Of Breath  . Penicillins Shortness Of Breath    Did it involve swelling of the face/tongue/throat, SOB, or low BP? Unknown Did it involve sudden or  severe rash/hives, skin peeling, or any reaction on the inside of your mouth or nose? Unknown Did you need to seek medical attention at a hospital or doctor's office? Unknown When did it last happen? If all above answers are "NO", may proceed with cephalosporin use.   . Promethazine Shortness Of Breath  . Sulfa Antibiotics Shortness Of Breath  . Trimethoprim Shortness Of Breath  . Codeine Nausea And Vomiting   Follow-up Information    Jonathon Bellows, PA-C. Schedule an appointment as soon as possible for a visit in 1 week(s).   Specialty: Physician Assistant Contact information: [address] [place] [address] [place] Follow up.   Why: This is [facility] which pt is already with. Resumption of Care.  Contact information: [address] [address] [phone]            The results of significant diagnostics from this hospitalization (including imaging, microbiology, ancillary and laboratory) are listed below for reference.    Significant Diagnostic Studies: Ct Abdomen Pelvis W Contrast  Result Date: 01/11/2019 CLINICAL DATA:  Acute generalized abdominal pain, most prominent in the  lower abdomen. EXAM: CT ABDOMEN AND PELVIS WITH CONTRAST TECHNIQUE: Multidetector CT imaging of the abdomen and pelvis was performed using the standard protocol following bolus administration of intravenous contrast. CONTRAST:  76m OMNIPAQUE IOHEXOL 300 MG/ML  SOLN COMPARISON:  12/27/2018 CT abdomen/pelvis. FINDINGS: Lower chest: Mild patchy ground-glass opacities in right middle lobe, new. Coronary atherosclerosis. Hepatobiliary: Normal liver size. No liver mass. Normal gallbladder with no radiopaque cholelithiasis. No biliary ductal dilatation. Pancreas: Normal, with no mass or duct dilation. Spleen: Normal size. No mass. Adrenals/Urinary Tract: Normal adrenals. Punctate nonobstructing lower right renal stone.  Subcentimeter hypodense renal cortical lesions in both kidneys are too small to characterize and require no follow-up. No hydronephrosis. Normal bladder. Stomach/Bowel: Moderate hiatal hernia. Otherwise normal nondistended stomach. Large duodenal diverticulum anterior to the descending duodenum. Normal caliber small bowel with no small bowel wall thickening. Appendix not discretely visualized. No pericecal inflammatory changes. Moderate diffuse colonic diverticulosis. No large bowel wall thickening or significant pericolonic fat stranding. Vascular/Lymphatic: Atherosclerotic nonaneurysmal abdominal aorta. Patent portal, splenic, hepatic and renal veins. Retroaortic left renal vein. No pathologically enlarged lymph nodes in the abdomen or pelvis. Reproductive: Status post hysterectomy, with no abnormal findings at the vaginal cuff. No adnexal mass. Other: No pneumoperitoneum, ascites or focal fluid collection. Musculoskeletal: No aggressive appearing focal osseous lesions. Multiple intact appearing pins in the left femoral neck. Diffuse osteopenia. Marked thoracolumbar spondylosis. IMPRESSION: 1. No acute abnormality. No evidence of bowel obstruction or acute bowel inflammation. Moderate diffuse colonic diverticulosis, with no evidence of acute diverticulitis. 2. Moderate hiatal hernia. 3. Nonspecific mild patchy ground-glass opacities in the right middle lobe, new, probably infectious or inflammatory. 4. Punctate nonobstructing lower right renal stone. 5. Coronary atherosclerosis. 6. Aortic Atherosclerosis (ICD10-I70.0). Electronically Signed   By: JIlona SorrelM.D.   On: 01/11/2019 19:13   Ct Abdomen Pelvis W Contrast  Result Date: 12/27/2018 CLINICAL DATA:  Generalized abdominal pain, left and right lower quadrant EXAM: CT ABDOMEN AND PELVIS WITH CONTRAST TECHNIQUE: Multidetector CT imaging of the abdomen and pelvis was performed using the standard protocol following bolus administration of intravenous  contrast. CONTRAST:  710mOMNIPAQUE IOHEXOL 300 MG/ML SOLN, 3057mMNIPAQUE IOHEXOL 300 MG/ML SOLN COMPARISON:  CT abdomen pelvis 12/22/2015 FINDINGS: Lower chest: Bibasilar atelectatic changes. Lung bases are otherwise clear. Heart is borderline enlarged. Atherosclerotic calcification of the coronary arteries. Hepatobiliary: No focal liver abnormality is seen. No gallstones, gallbladder wall thickening, or biliary dilatation. Pancreas: Mild pancreatic atrophy. No pancreatic ductal dilatation or surrounding inflammatory changes. Spleen: Normal in size without focal abnormality. Adrenals/Urinary Tract: Normal adrenal glands. Modest distention of the bilateral extrarenal pelves with mild urothelial thickening. Bladder is slightly distended. No CT evident urolithiasis. No concerning renal lesions. Stomach/Bowel: Moderate hiatal hernia. Enteric contrast medium traverses to the level of the sigmoid colon. No small bowel dilatation or wall thickening. No evidence of obstruction. Normal appendix in the right lower quadrant. There is segmental thickening of the sigmoid colon and rectum with adjacent pericolonic inflammatory features. Others are scattered colonic diverticula. The inflammation does not appear centered upon a focal culprit diverticulum. No extraluminal gas. No organized abscess or collection. Vascular/Lymphatic: Atherosclerotic plaque within the normal caliber aorta. Retroaortic left renal vein. No other significant vascular findings. Reactive adenopathy is noted in the low abdomen. No pathologically enlarged nodes. Reproductive: Uterus is surgically absent. No concerning adnexal lesions. Other: Reactive free fluid in the pelvis. No abdominopelvic free air. No organized collection or abscess. Mild body wall edema. Posterior injection granulomata. Musculoskeletal: Left femoral  neck is transfixed by 3 partially threaded cannulated screws. Mild resulting streak artifact. There is dextrocurvature of the spine  centered at L2. Multilevel discogenic and facet degenerative changes are present with additional posterior facet arthropathy resulting in mild to moderate multilevel foraminal narrowing and moderate to severe stenosis of the spinal canal L3-S1. IMPRESSION: 1. Segmental thickening of the sigmoid colon and rectum with adjacent pericolonic inflammatory features, consistent with colitis, either infectious or inflammatory in etiology. Scattered colonic diverticula are present though a focal diverticulitis is less favored. No evidence of perforation or abscess formation. 2. Modest distention of the bilateral extrarenal pelves with mild urothelial thickening, nonspecific, but can be seen with urinary tract infection. Correlate with urinalysis. 3. Extensive moderate to severe degenerative changes of the spine, detailed above. Multilevel foraminal narrowing and spinal canal stenosis is maximal from L3-S1. 4. Aortic Atherosclerosis (ICD10-I70.0). Electronically Signed   By: Lovena Le M.D.   On: 12/27/2018 22:25   Dg Chest Port 1 View  Result Date: 12/27/2018 CLINICAL DATA:  Epigastric pain EXAM: PORTABLE CHEST 1 VIEW COMPARISON:  11/18/2016 FINDINGS: No focal opacity or pleural effusion. Mild cardiomegaly with aortic atherosclerosis. Calcified left hilar nodes. No pneumothorax. Retrocardiac lucency, presumably moderate hiatal hernia IMPRESSION: 1. Mild cardiomegaly without acute airspace disease 2. Moderate hiatal hernia suspected 3. Prior granulomatous disease Electronically Signed   By: Donavan Foil M.D.   On: 12/27/2018 20:13    Microbiology: No results found for this or any previous visit (from the past 240 hour(s)).   Labs: Basic Metabolic Panel: No results for input(s): NA, K, CL, CO2, GLUCOSE, BUN, CREATININE, CALCIUM, MG, PHOS in the last 168 hours. Liver Function Tests: No results for input(s): AST, ALT, ALKPHOS, BILITOT, PROT, ALBUMIN in the last 168 hours. No results for input(s): LIPASE, AMYLASE  in the last 168 hours. No results for input(s): AMMONIA in the last 168 hours. CBC: No results for input(s): WBC, NEUTROABS, HGB, HCT, MCV, PLT in the last 168 hours. Cardiac Enzymes: No results for input(s): CKTOTAL, CKMB, CKMBINDEX, TROPONINI in the last 168 hours. BNP: BNP (last 3 results) No results for input(s): BNP in the last 8760 hours.  ProBNP (last 3 results) No results for input(s): PROBNP in the last 8760 hours.  CBG: No results for input(s): GLUCAP in the last 168 hours.     Signed:  Domenic Polite MD.  [facility] 01/24/2019, 3:31 PM

## 2019-08-11 ENCOUNTER — Encounter (HOSPITAL_BASED_OUTPATIENT_CLINIC_OR_DEPARTMENT_OTHER): Payer: Self-pay | Admitting: Emergency Medicine

## 2019-08-11 ENCOUNTER — Emergency Department (HOSPITAL_BASED_OUTPATIENT_CLINIC_OR_DEPARTMENT_OTHER): Payer: Medicare Other

## 2019-08-11 ENCOUNTER — Other Ambulatory Visit: Payer: Self-pay

## 2019-08-11 ENCOUNTER — Emergency Department (HOSPITAL_BASED_OUTPATIENT_CLINIC_OR_DEPARTMENT_OTHER)
Admission: EM | Admit: 2019-08-11 | Discharge: 2019-08-11 | Disposition: A | Discharge status: Home / Self Care | Payer: Medicare Other | Attending: Emergency Medicine | Admitting: Emergency Medicine

## 2019-08-11 DIAGNOSIS — I10 Essential (primary) hypertension: Secondary | ICD-10-CM | POA: Insufficient documentation

## 2019-08-11 DIAGNOSIS — J029 Acute pharyngitis, unspecified: Secondary | ICD-10-CM | POA: Diagnosis not present

## 2019-08-11 DIAGNOSIS — Z881 Allergy status to other antibiotic agents status: Secondary | ICD-10-CM | POA: Diagnosis not present

## 2019-08-11 DIAGNOSIS — Z880 Allergy status to penicillin: Secondary | ICD-10-CM | POA: Insufficient documentation

## 2019-08-11 DIAGNOSIS — Z79899 Other long term (current) drug therapy: Secondary | ICD-10-CM | POA: Insufficient documentation

## 2019-08-11 DIAGNOSIS — L299 Pruritus, unspecified: Secondary | ICD-10-CM

## 2019-08-11 DIAGNOSIS — Z888 Allergy status to other drugs, medicaments and biological substances status: Secondary | ICD-10-CM | POA: Insufficient documentation

## 2019-08-11 DIAGNOSIS — N3000 Acute cystitis without hematuria: Secondary | ICD-10-CM | POA: Diagnosis not present

## 2019-08-11 DIAGNOSIS — Z7982 Long term (current) use of aspirin: Secondary | ICD-10-CM | POA: Diagnosis not present

## 2019-08-11 DIAGNOSIS — R21 Rash and other nonspecific skin eruption: Secondary | ICD-10-CM | POA: Diagnosis present

## 2019-08-11 LAB — CBC WITH DIFFERENTIAL/PLATELET
Abs Immature Granulocytes: 0.03 10*3/uL (ref 0.00–0.07)
Basophils Absolute: 0.1 10*3/uL (ref 0.0–0.1)
Basophils Relative: 1 %
Eosinophils Absolute: 0.3 10*3/uL (ref 0.0–0.5)
Eosinophils Relative: 4 %
HCT: 33.1 % — ABNORMAL LOW (ref 36.0–46.0)
Hemoglobin: 10.3 g/dL — ABNORMAL LOW (ref 12.0–15.0)
Immature Granulocytes: 0 %
Lymphocytes Relative: 31 %
Lymphs Abs: 2.6 10*3/uL (ref 0.7–4.0)
MCH: 30.7 pg (ref 26.0–34.0)
MCHC: 31.1 g/dL (ref 30.0–36.0)
MCV: 98.5 fL (ref 80.0–100.0)
Monocytes Absolute: 0.7 10*3/uL (ref 0.1–1.0)
Monocytes Relative: 8 %
Neutro Abs: 4.7 10*3/uL (ref 1.7–7.7)
Neutrophils Relative %: 56 %
Platelets: 271 10*3/uL (ref 150–400)
RBC: 3.36 MIL/uL — ABNORMAL LOW (ref 3.87–5.11)
RDW: 12.7 % (ref 11.5–15.5)
WBC: 8.5 10*3/uL (ref 4.0–10.5)
nRBC: 0 % (ref 0.0–0.2)

## 2019-08-11 LAB — COMPREHENSIVE METABOLIC PANEL
ALT: 14 U/L (ref 0–44)
AST: 18 U/L (ref 15–41)
Albumin: 3.8 g/dL (ref 3.5–5.0)
Alkaline Phosphatase: 64 U/L (ref 38–126)
Anion gap: 7 (ref 5–15)
BUN: 24 mg/dL — ABNORMAL HIGH (ref 8–23)
CO2: 26 mmol/L (ref 22–32)
Calcium: 9.6 mg/dL (ref 8.9–10.3)
Chloride: 108 mmol/L (ref 98–111)
Creatinine, Ser: 1.08 mg/dL — ABNORMAL HIGH (ref 0.44–1.00)
GFR calc Af Amer: 49 mL/min — ABNORMAL LOW (ref >60)
GFR calc non Af Amer: 43 mL/min — ABNORMAL LOW (ref >60)
Glucose, Bld: 103 mg/dL — ABNORMAL HIGH (ref 70–99)
Potassium: 4.3 mmol/L (ref 3.5–5.1)
Sodium: 141 mmol/L (ref 135–145)
Total Bilirubin: 0.4 mg/dL (ref 0.3–1.2)
Total Protein: 7 g/dL (ref 6.5–8.1)

## 2019-08-11 LAB — URINALYSIS, ROUTINE W REFLEX MICROSCOPIC
Bilirubin Urine: NEGATIVE
Glucose, UA: NEGATIVE mg/dL
Hgb urine dipstick: NEGATIVE
Ketones, ur: NEGATIVE mg/dL
Nitrite: POSITIVE — AB
Protein, ur: NEGATIVE mg/dL
Specific Gravity, Urine: 1.01 (ref 1.005–1.030)
pH: 7 (ref 5.0–8.0)

## 2019-08-11 LAB — URINALYSIS, MICROSCOPIC (REFLEX): RBC / HPF: NONE SEEN RBC/hpf (ref 0–5)

## 2019-08-11 LAB — GROUP A STREP BY PCR: Group A Strep by PCR: NOT DETECTED

## 2019-08-11 MED ORDER — FLUCONAZOLE 150 MG PO TABS
150.0000 mg | ORAL_TABLET | Freq: Once | ORAL | Status: AC
  Administered 2019-08-11: 150 mg via ORAL
  Filled 2019-08-11: qty 1

## 2019-08-11 MED ORDER — CEPHALEXIN 250 MG PO CAPS
250.0000 mg | ORAL_CAPSULE | Freq: Four times a day (QID) | ORAL | 0 refills | Status: DC
Start: 2019-08-11 — End: 2020-02-19

## 2019-08-11 MED ORDER — DIPHENHYDRAMINE HCL 50 MG/ML IJ SOLN
12.5000 mg | Freq: Once | INTRAMUSCULAR | Status: AC
  Administered 2019-08-11: 12.5 mg via INTRAVENOUS
  Filled 2019-08-11: qty 1

## 2019-08-11 MED ORDER — LIDOCAINE VISCOUS HCL 2 % MT SOLN: 15.0000 mL | Freq: Once | OROMUCOSAL | Status: DC

## 2019-08-11 MED ORDER — IOHEXOL 300 MG/ML  SOLN
100.0000 mL | Freq: Once | INTRAMUSCULAR | Status: AC | PRN
  Administered 2019-08-11: 75 mL via INTRAVENOUS

## 2019-08-11 MED ORDER — CEPHALEXIN 250 MG PO CAPS
250.0000 mg | ORAL_CAPSULE | Freq: Once | ORAL | Status: AC
  Administered 2019-08-11: 250 mg via ORAL
  Filled 2019-08-11: qty 1

## 2019-08-11 NOTE — ED Notes (Signed)
Pt son told dr he might pass out because he is a diabetic and hasn't eaten. Offered snack again but he declined.

## 2019-08-11 NOTE — Discharge Instructions (Signed)
OTC probiotics to help with the yeast.

## 2019-08-11 NOTE — ED Notes (Signed)
Son verbalized understanding of d/c instructions

## 2019-08-11 NOTE — ED Provider Notes (Signed)
[facility] EMERGENCY DEPARTMENT Provider Note   CSN: [id number] Arrival date & time: 08/11/19  5852     History Chief Complaint  Patient presents with  . Rash  . Eye Problem  . Sore Throat    Lisa Freeman is a 86 y.o. female.  Pt presents to the ED today with a sore throat.  Pt said she woke up in the middle of the night and felt like she could not swallow.  The pt does have a hx of an esophageal impaction.  She did eat a cheese dog with chili.  The pt has not tried to swallow anything this am.  She denies any sob or n/v.  She is handling her secretions.        Past Medical History:  Diagnosis Date  . Hypertension     Patient Active Problem List   Diagnosis Date Noted  . Pneumonia of right middle lobe due to infectious organism   . UTI (urinary tract infection) 01/12/2019  . AKI (acute kidney injury) ([place]) 01/12/2019  . Anxiety 01/12/2019  . Diarrhea 01/12/2019  . Abdominal pain 01/11/2019  . Colitis presumed infectious 12/27/2018  . Anemia   . Chronic anemia   . Insomnia   . Subclinical hyperthyroidism   . Anxiety state, unspecified   . Atrial fibrillation ([place])   . Carotid stenosis   . Cerebral artery occlusion   . Diverticulosis of colon   . Dyshidrosis   . Other and unspecified hyperlipidemia   . Migraine   . Rosacea   . Unspecified venous (peripheral) insufficiency   . Urinary tract infection, site not specified 11/10/2013  . Urinary tract obstruction due to kidney stone 10/25/2012  . Hypertension     Past Surgical History:  Procedure Laterality Date  . ABDOMINAL HYSTERECTOMY    . APPENDECTOMY    . BACK SURGERY    . CYSTOSCOPY W/ RETROGRADES Left 11/06/2012   Procedure: CYSTOSCOPY WITH RETROGRADE PYELOGRAM;  Surgeon: Hanley Ben, MD;  Location: [facility];  Service: Urology;  Laterality: Left;  . CYSTOSCOPY W/ URETERAL STENT REMOVAL Left 11/06/2012   Procedure: CYSTOSCOPY WITH STENT REMOVAL;  Surgeon: Hanley Ben, MD;  Location: [facility];  Service: Urology;  Laterality: Left;  . CYSTOSCOPY WITH RETROGRADE PYELOGRAM, URETEROSCOPY AND STENT PLACEMENT Left 10/25/2012   Procedure: CYSTOSCOPY/LEFT RETROGRADE PYELOGRAM/PLACEMENT LEFT URETERAL STENT;  Surgeon: Hanley Ben, MD;  Location: [facility];  Service: Urology;  Laterality: Left;  CYSTOSCOPY/LEFT RETROGRADE PYELOGRAM/URETEROSCOPY/PLACEMENT URETERAL STENT  . CYSTOSCOPY WITH STENT PLACEMENT Left 11/06/2012   Procedure: CYSTOSCOPY WITH STENT PLACEMENT;  Surgeon: Hanley Ben, MD;  Location: [facility];  Service: Urology;  Laterality: Left;  . CYSTOSCOPY WITH URETEROSCOPY AND STENT PLACEMENT Left 11/06/2012   Procedure: CYSTOSCOPY WITH URETEROSCOPY, STONE MANIPULATION;  Surgeon: Hanley Ben, MD;  Location: [facility];  Service: Urology;  Laterality: Left;  . HIP FRACTURE SURGERY Left      OB History   No obstetric history on file.     History reviewed. No pertinent family history.  Social History   Tobacco Use  . Smoking status: Never Smoker  . Smokeless tobacco: Never Used  Substance Use Topics  . Alcohol use: No  . Drug use: No    Home Medications Prior to Admission medications   Medication Sig Start Date End Date Taking? Authorizing Provider  acetaminophen (TYLENOL) 500 MG tablet Take 1,000 mg by mouth every 6 (six) hours as needed for moderate pain.  [provider]  ALPRAZolam Prudy Feeler) 0.5 MG tablet Take 0.25-0.5 mg by mouth 2 (two) times daily as needed for anxiety.    [provider]  amLODipine (NORVASC) 5 MG tablet Take 5 mg by mouth daily.    [provider]  aspirin EC 81 MG tablet Take 81 mg by mouth daily.    [provider]  cephALEXin (KEFLEX) 250 MG capsule Take 1 capsule (250 mg total) by mouth 4 (four) times daily. 08/11/19   Jacalyn Lefevre, MD  folic acid (FOLVITE) 1 MG tablet Take 1 mg by mouth every evening.     [provider]  metoprolol succinate (TOPROL-XL) 25 MG 24 hr tablet Take 12.5 mg by mouth 2 (two) times daily.    [provider]  Olopatadine HCl (PATADAY) 0.2 % SOLN Place 1 drop into the right eye daily.     [provider]  prednisoLONE acetate (PRED FORTE) 1 % ophthalmic suspension Place 1 drop into the right eye at bedtime.     [provider]  senna (SENOKOT) 8.6 MG TABS tablet Take 1 tablet (8.6 mg total) by mouth daily as needed for mild constipation. 01/14/19   Zannie Cove, MD  sodium chloride (MURO 128) 2 % ophthalmic solution Place 1 drop into both eyes every 4 (four) hours as needed for eye irritation.    [provider]    Allergies    Doxycycline, Fluticasone propionate, Moxifloxacin, Nitrofurantoin monohyd macro, Penicillins, Promethazine, Sulfa antibiotics, Trimethoprim, and Codeine  Review of Systems   Review of Systems  HENT: Positive for sore throat.   All other systems reviewed and are negative.   Physical Exam Updated Vital Signs BP (!) 190/64 (BP Location: Right Arm)   Pulse (!) 59   Temp 97.9 F (36.6 C) (Oral)   Resp 20   SpO2 100%   Physical Exam Vitals and nursing note reviewed.  Constitutional:      Appearance: She is well-developed.  HENT:     Head: Normocephalic and atraumatic.     Right Ear: Tympanic membrane and ear canal normal.     Left Ear: Tympanic membrane and ear canal normal.     Mouth/Throat:     Mouth: Mucous membranes are moist.     Pharynx: Oropharynx is clear. Uvula midline.  Eyes:     Conjunctiva/sclera: Conjunctivae normal.     Pupils: Pupils are equal, round, and reactive to light.  Cardiovascular:     Rate and Rhythm: Normal rate and regular rhythm.  Pulmonary:     Effort: Pulmonary effort is normal.     Breath sounds: Normal breath sounds.  Abdominal:     General: Bowel sounds are normal.     Palpations: Abdomen is soft.  Musculoskeletal:     Cervical back: Normal range of motion  and neck supple.  Skin:    General: Skin is warm and dry.     Capillary Refill: Capillary refill takes less than 2 seconds.  Neurological:     General: No focal deficit present.     Mental Status: She is alert and oriented to person, place, and time.  Psychiatric:        Mood and Affect: Mood normal.        Behavior: Behavior normal.     ED Results / Procedures / Treatments   Labs (all labs ordered are listed, but only abnormal results are displayed) Labs Reviewed  COMPREHENSIVE METABOLIC PANEL - Abnormal; Notable for the following components:  Result Value   Glucose, Bld 103 (*)    BUN 24 (*)    Creatinine, Ser 1.08 (*)    GFR calc non Af Amer 43 (*)    GFR calc Af Amer 49 (*)    All other components within normal limits  CBC WITH DIFFERENTIAL/PLATELET - Abnormal; Notable for the following components:   RBC 3.36 (*)    Hemoglobin 10.3 (*)    HCT 33.1 (*)    All other components within normal limits  URINALYSIS, ROUTINE W REFLEX MICROSCOPIC - Abnormal; Notable for the following components:   APPearance CLOUDY (*)    Nitrite POSITIVE (*)    Leukocytes,Ua MODERATE (*)    All other components within normal limits  URINALYSIS, MICROSCOPIC (REFLEX) - Abnormal; Notable for the following components:   Bacteria, UA MANY (*)    All other components within normal limits  GROUP A STREP BY PCR  URINE CULTURE    EKG EKG Interpretation  Date/Time:  Sunday Aug 11 2019 09:11:27 EDT Ventricular Rate:  56 PR Interval:    QRS Duration: 101 QT Interval:  442 QTC Calculation: 427 R Axis:   42 Text Interpretation: Sinus rhythm Probable anteroseptal infarct, old Poor data quality No significant change since last tracing Confirmed by Jacalyn Lefevre [phone]) on 08/11/2019 9:23:13 AM   Radiology CT Soft Tissue Neck W Contrast  Result Date: 08/11/2019 CLINICAL DATA:  Sore throat EXAM: CT NECK WITH CONTRAST TECHNIQUE: Multidetector CT imaging of the neck was performed using the  standard protocol following the bolus administration of intravenous contrast. CONTRAST:  52mL OMNIPAQUE IOHEXOL 300 MG/ML  SOLN COMPARISON:  None. FINDINGS: Pharynx and larynx: Nonspecific effacement of the right lateral oropharynx and vallecula. Parapharyngeal fat is preserved. There is no evidence of abscess. Hypopharynx, larynx, and subglottic airway are unremarkable. Salivary glands: Unremarkable. Thyroid: Enlarged and heterogeneous. Lymph nodes: No enlarged lymph nodes in the neck. Vascular: Major neck vessels are patent. There is calcified plaque at the right greater than left ICA origins. Limited intracranial: No abnormal enhancement. Visualized orbits: No significant abnormality. Mastoids and visualized paranasal sinuses: Aerated. Skeleton: Multilevel cervical spine degenerative changes. Upper chest: No apical lung mass. Other: None. IMPRESSION: No significant inflammatory changes or evidence of abscess. Heterogeneous and enlarged thyroid. Ultrasound is not recommended in the setting significant comorbidities or limited life expectancy. (Ref: J Am Coll Radiol. 2015 Feb;12(2): 143-50). Electronically Signed   By: Guadlupe Spanish M.D.   On: 08/11/2019 10:32    Procedures Procedures (including critical care time)  Medications Ordered in ED Medications  lidocaine (XYLOCAINE) 2 % viscous mouth solution 15 mL (has no administration in time range)  diphenhydrAMINE (BENADRYL) injection 12.5 mg (has no administration in time range)  fluconazole (DIFLUCAN) tablet 150 mg (has no administration in time range)  cephALEXin (KEFLEX) capsule 250 mg (has no administration in time range)  iohexol (OMNIPAQUE) 300 MG/ML solution 100 mL (75 mLs Intravenous Contrast Given 08/11/19 7989)    ED Course  I have reviewed the triage vital signs and the nursing notes.  Pertinent labs & imaging results that were available during my care of the patient were reviewed by me and considered in my medical decision making (see  chart for details).    MDM Rules/Calculators/A&P                     Pt given water and she is able to swallow it without any coughing.  She feels like it is going down normally.  Pt is neg for strep.  CT neck ok.  Pt to f/u with ENT if sx persist.  Pt c/o itching (for over 1 year).  She is given IV benadryl.  She has a UTI with multiple allergies.  She's had cephalosporins in the past and so I will put her on keflex.  She has some redness and itching to her vagina.  She likely has yeast, so I will give her diflucan.   Final Clinical Impression(s) / ED Diagnoses Final diagnoses:  Acute cystitis without hematuria  Pharyngitis, unspecified etiology  Itching    Rx / DC Orders ED Discharge Orders         Ordered    cephALEXin (KEFLEX) 250 MG capsule  4 times daily     08/11/19 1058           Jacalyn Lefevre, MD 08/11/19 1102

## 2019-08-11 NOTE — ED Triage Notes (Signed)
Pt here with itching x 1 year, but throat soreness/swelling and blurred vision since this morning.

## 2019-08-11 NOTE — ED Notes (Signed)
Son stating he is diabetic and needs to go get something to eat. I advised him of visitor policy and also offered a snack. He refused snack.

## 2019-08-13 LAB — URINE CULTURE: Culture: >=100000 — AB

## 2019-08-14 ENCOUNTER — Telehealth: Payer: Self-pay | Admitting: *Deleted

## 2019-08-14 NOTE — Telephone Encounter (Signed)
Post ED Visit - Positive Culture Follow-up  Culture report reviewed by antimicrobial stewardship pharmacist: [facility] []  , Pharm.D. []  Enzo Bi, Pharm.D., BCPS AQ-ID []  , Pharm.D., BCPS []  Celedonio Miyamoto, Pharm.D., BCPS []  [place], Garvin Fila.D., BCPS, AAHIVP []  , Pharm.D., BCPS, AAHIVP []  Georgina Pillion, PharmD, BCPS []  , PharmD, BCPS []  [place], PharmD, BCPS []  [address], PharmD []  , PharmD, BCPS []  Estella Husk, PharmD , PharmD  [facility] []  , PharmD []  Phillips Climes, PharmD []  , PharmD []  Agapito Games, Rph []  ) Verlan Friends, PharmD []  , PharmD []  Mervyn Gay, PharmD []  , PharmD []  Vinnie Level, PharmD []  Cherlyn Cushing, PharmD []  [facility], PharmD []  , PharmD []  Len Childs, PharmD   Positive urine culture Treated with Cephalexin, organism sensitive to the same and no further patient follow-up is required at this time.  [facility] 08/14/2019, 11:05 AM

## 2019-12-16 ENCOUNTER — Encounter (HOSPITAL_BASED_OUTPATIENT_CLINIC_OR_DEPARTMENT_OTHER): Payer: Self-pay | Admitting: *Deleted

## 2019-12-16 ENCOUNTER — Emergency Department (HOSPITAL_BASED_OUTPATIENT_CLINIC_OR_DEPARTMENT_OTHER)
Admission: EM | Admit: 2019-12-16 | Discharge: 2019-12-16 | Disposition: A | Discharge status: Home / Self Care | Payer: Medicare Other | Attending: Emergency Medicine | Admitting: Emergency Medicine

## 2019-12-16 ENCOUNTER — Other Ambulatory Visit: Payer: Self-pay

## 2019-12-16 DIAGNOSIS — R10813 Right lower quadrant abdominal tenderness: Secondary | ICD-10-CM | POA: Diagnosis not present

## 2019-12-16 DIAGNOSIS — B373 Candidiasis of vulva and vagina: Secondary | ICD-10-CM | POA: Insufficient documentation

## 2019-12-16 DIAGNOSIS — I10 Essential (primary) hypertension: Secondary | ICD-10-CM | POA: Diagnosis not present

## 2019-12-16 DIAGNOSIS — Z7982 Long term (current) use of aspirin: Secondary | ICD-10-CM | POA: Insufficient documentation

## 2019-12-16 DIAGNOSIS — R319 Hematuria, unspecified: Secondary | ICD-10-CM | POA: Insufficient documentation

## 2019-12-16 DIAGNOSIS — Z79899 Other long term (current) drug therapy: Secondary | ICD-10-CM | POA: Insufficient documentation

## 2019-12-16 DIAGNOSIS — B3731 Acute candidiasis of vulva and vagina: Secondary | ICD-10-CM

## 2019-12-16 LAB — URINALYSIS, ROUTINE W REFLEX MICROSCOPIC
Bilirubin Urine: NEGATIVE
Glucose, UA: NEGATIVE mg/dL
Hgb urine dipstick: NEGATIVE
Ketones, ur: NEGATIVE mg/dL
Leukocytes,Ua: NEGATIVE
Nitrite: NEGATIVE
Protein, ur: NEGATIVE mg/dL
Specific Gravity, Urine: 1.01 (ref 1.005–1.030)
pH: 5.5 (ref 5.0–8.0)

## 2019-12-16 MED ORDER — FLUCONAZOLE 150 MG PO TABS
150.0000 mg | ORAL_TABLET | Freq: Once | ORAL | Status: AC
  Administered 2019-12-16: 150 mg via ORAL
  Filled 2019-12-16: qty 1

## 2019-12-16 NOTE — ED Triage Notes (Signed)
Hematuria x 1 day. Pt incontinent of urine

## 2019-12-16 NOTE — Discharge Instructions (Addendum)
Follow-up with your doctor for further abdominal pain and for further evaluation of the blood in the urine.

## 2019-12-16 NOTE — ED Provider Notes (Signed)
[facility] EMERGENCY DEPARTMENT Provider Note   CSN: [id number] Arrival date & time: 12/16/19  1335     History Chief Complaint  Patient presents with  . Hematuria    Lisa Freeman is a 86 y.o. female. Level 5 caveat due to difficulty hearing and some comprehension issues HPI Patient presents with blood in the urine.  Reportedly started at noon today.  Patient's caregiver looked and saw blood in the urine in the bowl.  Patient has been without complaints.  Does say she has some right-sided abdominal tenderness.  Patient is on aspirin no other blood thinners.  Treated around 2 weeks ago for presumed UTI with Levaquin.  Reviewing records does have previous nonobstructing renal stone on the right side on the CT scan around a year ago.    Past Medical History:  Diagnosis Date  . Hypertension     Patient Active Problem List   Diagnosis Date Noted  . Pneumonia of right middle lobe due to infectious organism   . UTI (urinary tract infection) 01/12/2019  . AKI (acute kidney injury) ([facility]) 01/12/2019  . Anxiety 01/12/2019  . Diarrhea 01/12/2019  . Abdominal pain 01/11/2019  . Colitis presumed infectious 12/27/2018  . Anemia   . Chronic anemia   . Insomnia   . Subclinical hyperthyroidism   . Anxiety state, unspecified   . Atrial fibrillation ([facility])   . Carotid stenosis   . Cerebral artery occlusion   . Diverticulosis of colon   . Dyshidrosis   . Other and unspecified hyperlipidemia   . Migraine   . Rosacea   . Unspecified venous (peripheral) insufficiency   . Urinary tract infection, site not specified 11/10/2013  . Urinary tract obstruction due to kidney stone 10/25/2012  . Hypertension     Past Surgical History:  Procedure Laterality Date  . ABDOMINAL HYSTERECTOMY    . APPENDECTOMY    . BACK SURGERY    . CYSTOSCOPY W/ RETROGRADES Left 11/06/2012   Procedure: CYSTOSCOPY WITH RETROGRADE PYELOGRAM;  Surgeon: Lindaann Slough, MD;  [facility];  Service: Urology;  Laterality: Left;  . CYSTOSCOPY W/ URETERAL STENT REMOVAL Left 11/06/2012   Procedure: CYSTOSCOPY WITH STENT REMOVAL;  Surgeon: Lindaann Slough, MD;  Location: [facility];  Service: Urology;  Laterality: Left;  . CYSTOSCOPY WITH RETROGRADE PYELOGRAM, URETEROSCOPY AND STENT PLACEMENT Left 10/25/2012   Procedure: CYSTOSCOPY/LEFT RETROGRADE PYELOGRAM/PLACEMENT LEFT URETERAL STENT;  Surgeon: Lindaann Slough, MD;  Location: [facility];  Service: Urology;  Laterality: Left;  CYSTOSCOPY/LEFT RETROGRADE PYELOGRAM/URETEROSCOPY/PLACEMENT URETERAL STENT  . CYSTOSCOPY WITH STENT PLACEMENT Left 11/06/2012   Procedure: CYSTOSCOPY WITH STENT PLACEMENT;  Surgeon: Lindaann Slough, MD;  Location: [facility];  Service: Urology;  Laterality: Left;  . CYSTOSCOPY WITH URETEROSCOPY AND STENT PLACEMENT Left 11/06/2012   Procedure: CYSTOSCOPY WITH URETEROSCOPY, STONE MANIPULATION;  Surgeon: Lindaann Slough, MD;  Location: [facility];  Service: Urology;  Laterality: Left;  . HIP FRACTURE SURGERY Left      OB History   No obstetric history on file.     History reviewed. No pertinent family history.  Social History   Tobacco Use  . Smoking status: Never Smoker  . Smokeless tobacco: Never Used  Substance Use Topics  . Alcohol use: No  . Drug use: No    Home Medications Prior to Admission medications   Medication Sig Start Date End Date Taking? Authorizing Provider  acetaminophen (TYLENOL) 500 MG tablet Take 1,000 mg by mouth every 6 (  six) hours as needed for moderate pain.    [provider]  ALPRAZolam Prudy Feeler) 0.5 MG tablet Take 0.25-0.5 mg by mouth 2 (two) times daily as needed for anxiety.    [provider]  amLODipine (NORVASC) 5 MG tablet Take 5 mg by mouth daily.    [provider]  aspirin EC 81 MG tablet Take 81 mg by mouth daily.    [provider]  cephALEXin (KEFLEX) 250 MG capsule Take 1  capsule (250 mg total) by mouth 4 (four) times daily. 08/11/19   Jacalyn Lefevre, MD  folic acid (FOLVITE) 1 MG tablet Take 1 mg by mouth every evening.     [provider]  metoprolol succinate (TOPROL-XL) 25 MG 24 hr tablet Take 12.5 mg by mouth 2 (two) times daily.    [provider]  Olopatadine HCl (PATADAY) 0.2 % SOLN Place 1 drop into the right eye daily.     [provider]  prednisoLONE acetate (PRED FORTE) 1 % ophthalmic suspension Place 1 drop into the right eye at bedtime.     [provider]  senna (SENOKOT) 8.6 MG TABS tablet Take 1 tablet (8.6 mg total) by mouth daily as needed for mild constipation. 01/14/19   Zannie Cove, MD  sodium chloride (MURO 128) 2 % ophthalmic solution Place 1 drop into both eyes every 4 (four) hours as needed for eye irritation.    [provider]    Allergies    Doxycycline, Fluticasone propionate, Moxifloxacin, Nitrofurantoin monohyd macro, Penicillins, Promethazine, Sulfa antibiotics, Trimethoprim, and Codeine  Review of Systems   Review of Systems  Unable to perform ROS: Dementia    Physical Exam Updated Vital Signs BP (!) 156/56 (BP Location: Left Arm)   Pulse 62   Temp 98.4 F (36.9 C) (Oral)   Resp 16   Ht 5' (1.524 m)   Wt 45.4 kg   SpO2 100%   BMI 19.53 kg/m   Physical Exam Vitals and nursing note reviewed.  HENT:     Head: Normocephalic.  Eyes:     General: No scleral icterus. Cardiovascular:     Rate and Rhythm: Normal rate.  Pulmonary:     Breath sounds: No wheezing or rhonchi.  Abdominal:     Tenderness: There is abdominal tenderness.     Comments: Mild right lower quadrant tenderness without rebound or guarding.  Genitourinary:    Comments: No CVA tenderness. Musculoskeletal:        General: No tenderness.  Skin:    General: Skin is warm.     Capillary Refill: Capillary refill takes less than 2 seconds.  Neurological:     Mental Status: She is alert.     ED  Results / Procedures / Treatments   Labs (all labs ordered are listed, but only abnormal results are displayed) Labs Reviewed  URINE CULTURE  URINALYSIS, ROUTINE W REFLEX MICROSCOPIC    EKG None  Radiology No results found.  Procedures Procedures (including critical care time)  Medications Ordered in ED Medications  fluconazole (DIFLUCAN) tablet 150 mg (has no administration in time range)    ED Course  I have reviewed the triage vital signs and the nursing notes.  Pertinent labs & imaging results that were available during my care of the patient were reviewed by me and considered in my medical decision making (see chart for details).    MDM Rules/Calculators/A&P                          *  Patient with potential hematuria.  No hematuria here.  Does have superficial potential yeast infection.  Mild abdominal tenderness.  Discussed with patient and her daughter.  No further work-up at this time.  Have follow-up with PCP as needed.  Family wishes to limit work-up. Final Clinical Impression(s) / ED Diagnoses Final diagnoses:  Hematuria, unspecified type  Yeast vaginitis    Rx / DC Orders ED Discharge Orders    None       Benjiman Core, MD 12/16/19 1551

## 2019-12-17 LAB — URINE CULTURE: Culture: NO GROWTH

## 2020-02-18 ENCOUNTER — Encounter (HOSPITAL_BASED_OUTPATIENT_CLINIC_OR_DEPARTMENT_OTHER): Payer: Self-pay | Admitting: *Deleted

## 2020-02-18 ENCOUNTER — Inpatient Hospital Stay (HOSPITAL_BASED_OUTPATIENT_CLINIC_OR_DEPARTMENT_OTHER)
Admission: EM | Admit: 2020-02-18 | Discharge: 2020-02-24 | DRG: 394 | Disposition: A | Discharge status: Skilled Nursing Facility | Payer: Medicare Other | Attending: Internal Medicine | Admitting: Internal Medicine

## 2020-02-18 ENCOUNTER — Emergency Department (HOSPITAL_BASED_OUTPATIENT_CLINIC_OR_DEPARTMENT_OTHER): Payer: Medicare Other

## 2020-02-18 ENCOUNTER — Other Ambulatory Visit: Payer: Self-pay

## 2020-02-18 DIAGNOSIS — R1084 Generalized abdominal pain: Secondary | ICD-10-CM

## 2020-02-18 DIAGNOSIS — T368X5A Adverse effect of other systemic antibiotics, initial encounter: Secondary | ICD-10-CM | POA: Diagnosis present

## 2020-02-18 DIAGNOSIS — Z66 Do not resuscitate: Secondary | ICD-10-CM | POA: Diagnosis present

## 2020-02-18 DIAGNOSIS — Z7982 Long term (current) use of aspirin: Secondary | ICD-10-CM

## 2020-02-18 DIAGNOSIS — R197 Diarrhea, unspecified: Secondary | ICD-10-CM | POA: Diagnosis present

## 2020-02-18 DIAGNOSIS — Z1623 Resistance to quinolones and fluoroquinolones: Secondary | ICD-10-CM | POA: Diagnosis present

## 2020-02-18 DIAGNOSIS — K529 Noninfective gastroenteritis and colitis, unspecified: Secondary | ICD-10-CM | POA: Diagnosis present

## 2020-02-18 DIAGNOSIS — H9190 Unspecified hearing loss, unspecified ear: Secondary | ICD-10-CM | POA: Diagnosis not present

## 2020-02-18 DIAGNOSIS — F419 Anxiety disorder, unspecified: Secondary | ICD-10-CM | POA: Diagnosis present

## 2020-02-18 DIAGNOSIS — I10 Essential (primary) hypertension: Secondary | ICD-10-CM | POA: Diagnosis not present

## 2020-02-18 DIAGNOSIS — Z20822 Contact with and (suspected) exposure to covid-19: Secondary | ICD-10-CM | POA: Diagnosis not present

## 2020-02-18 DIAGNOSIS — K521 Toxic gastroenteritis and colitis: Principal | ICD-10-CM | POA: Diagnosis present

## 2020-02-18 DIAGNOSIS — R059 Cough, unspecified: Secondary | ICD-10-CM

## 2020-02-18 DIAGNOSIS — N390 Urinary tract infection, site not specified: Secondary | ICD-10-CM | POA: Diagnosis not present

## 2020-02-18 DIAGNOSIS — Z880 Allergy status to penicillin: Secondary | ICD-10-CM | POA: Diagnosis not present

## 2020-02-18 DIAGNOSIS — Z79899 Other long term (current) drug therapy: Secondary | ICD-10-CM

## 2020-02-18 DIAGNOSIS — B9620 Unspecified Escherichia coli [E. coli] as the cause of diseases classified elsewhere: Secondary | ICD-10-CM | POA: Diagnosis present

## 2020-02-18 DIAGNOSIS — R109 Unspecified abdominal pain: Secondary | ICD-10-CM | POA: Diagnosis present

## 2020-02-18 DIAGNOSIS — D649 Anemia, unspecified: Secondary | ICD-10-CM | POA: Diagnosis present

## 2020-02-18 LAB — URINALYSIS, ROUTINE W REFLEX MICROSCOPIC
Bilirubin Urine: NEGATIVE
Glucose, UA: NEGATIVE mg/dL
Hgb urine dipstick: NEGATIVE
Ketones, ur: NEGATIVE mg/dL
Leukocytes,Ua: NEGATIVE
Nitrite: POSITIVE — AB
Protein, ur: NEGATIVE mg/dL
Specific Gravity, Urine: <1.005 — ABNORMAL LOW (ref 1.005–1.030)
pH: 6 (ref 5.0–8.0)

## 2020-02-18 LAB — CBC WITH DIFFERENTIAL/PLATELET
Abs Immature Granulocytes: 0.05 10*3/uL (ref 0.00–0.07)
Basophils Absolute: 0 10*3/uL (ref 0.0–0.1)
Basophils Relative: 0 %
Eosinophils Absolute: 0.1 10*3/uL (ref 0.0–0.5)
Eosinophils Relative: 1 %
HCT: 33.1 % — ABNORMAL LOW (ref 36.0–46.0)
Hemoglobin: 10.6 g/dL — ABNORMAL LOW (ref 12.0–15.0)
Immature Granulocytes: 0 %
Lymphocytes Relative: 17 %
Lymphs Abs: 2.1 10*3/uL (ref 0.7–4.0)
MCH: 30.4 pg (ref 26.0–34.0)
MCHC: 32 g/dL (ref 30.0–36.0)
MCV: 94.8 fL (ref 80.0–100.0)
Monocytes Absolute: 0.9 10*3/uL (ref 0.1–1.0)
Monocytes Relative: 7 %
Neutro Abs: 9.6 10*3/uL — ABNORMAL HIGH (ref 1.7–7.7)
Neutrophils Relative %: 75 %
Platelets: 247 10*3/uL (ref 150–400)
RBC: 3.49 MIL/uL — ABNORMAL LOW (ref 3.87–5.11)
RDW: 13.7 % (ref 11.5–15.5)
WBC: 12.8 10*3/uL — ABNORMAL HIGH (ref 4.0–10.5)
nRBC: 0 % (ref 0.0–0.2)

## 2020-02-18 LAB — COMPREHENSIVE METABOLIC PANEL
ALT: 16 U/L (ref 0–44)
AST: 17 U/L (ref 15–41)
Albumin: 3.4 g/dL — ABNORMAL LOW (ref 3.5–5.0)
Alkaline Phosphatase: 75 U/L (ref 38–126)
Anion gap: 9 (ref 5–15)
BUN: 21 mg/dL (ref 8–23)
CO2: 23 mmol/L (ref 22–32)
Calcium: 9.7 mg/dL (ref 8.9–10.3)
Chloride: 105 mmol/L (ref 98–111)
Creatinine, Ser: 1.16 mg/dL — ABNORMAL HIGH (ref 0.44–1.00)
GFR, Estimated: 43 mL/min — ABNORMAL LOW (ref >60)
Glucose, Bld: 102 mg/dL — ABNORMAL HIGH (ref 70–99)
Potassium: 4.1 mmol/L (ref 3.5–5.1)
Sodium: 137 mmol/L (ref 135–145)
Total Bilirubin: 0.4 mg/dL (ref 0.3–1.2)
Total Protein: 6.7 g/dL (ref 6.5–8.1)

## 2020-02-18 LAB — URINALYSIS, MICROSCOPIC (REFLEX)

## 2020-02-18 LAB — RESPIRATORY PANEL BY RT PCR (FLU A&B, COVID)
Influenza A by PCR: NEGATIVE
Influenza B by PCR: NEGATIVE
SARS Coronavirus 2 by RT PCR: NEGATIVE

## 2020-02-18 LAB — LIPASE, BLOOD: Lipase: 44 U/L (ref 11–51)

## 2020-02-18 LAB — LACTIC ACID, PLASMA: Lactic Acid, Venous: 0.9 mmol/L (ref 0.5–1.9)

## 2020-02-18 MED ORDER — SODIUM CHLORIDE 0.9 % IV SOLN
1.0000 g | Freq: Once | INTRAVENOUS | Status: AC
  Administered 2020-02-18: 1 g via INTRAVENOUS
  Filled 2020-02-18: qty 10

## 2020-02-18 MED ORDER — SODIUM CHLORIDE 0.9 % IV SOLN: INTRAVENOUS | Status: DC | PRN

## 2020-02-18 MED ORDER — ONDANSETRON HCL 4 MG/2ML IJ SOLN
4.0000 mg | Freq: Once | INTRAMUSCULAR | Status: AC
  Administered 2020-02-18: 4 mg via INTRAVENOUS
  Filled 2020-02-18: qty 2

## 2020-02-18 MED ORDER — ACETAMINOPHEN 325 MG PO TABS
650.0000 mg | ORAL_TABLET | ORAL | Status: DC | PRN
  Administered 2020-02-18 – 2020-02-24 (×6): 650 mg via ORAL
  Filled 2020-02-18 (×7): qty 2

## 2020-02-18 MED ORDER — LACTATED RINGERS IV SOLN: INTRAVENOUS | Status: DC

## 2020-02-18 MED ORDER — IOHEXOL 300 MG/ML  SOLN
100.0000 mL | Freq: Once | INTRAMUSCULAR | Status: AC | PRN
  Administered 2020-02-18: 75 mL via INTRAVENOUS

## 2020-02-18 MED ORDER — METRONIDAZOLE IN NACL 5-0.79 MG/ML-% IV SOLN
500.0000 mg | Freq: Once | INTRAVENOUS | Status: AC
  Administered 2020-02-18: 500 mg via INTRAVENOUS
  Filled 2020-02-18: qty 100

## 2020-02-18 MED ORDER — SODIUM CHLORIDE 0.9 % IV BOLUS
500.0000 mL | Freq: Once | INTRAVENOUS | Status: AC
  Administered 2020-02-18: 500 mL via INTRAVENOUS

## 2020-02-18 MED ORDER — FENTANYL CITRATE (PF) 100 MCG/2ML IJ SOLN
25.0000 ug | Freq: Once | INTRAMUSCULAR | Status: AC
  Administered 2020-02-18: 25 ug via INTRAVENOUS
  Filled 2020-02-18: qty 2

## 2020-02-18 NOTE — ED Notes (Signed)
Resting more comfortably at this time, warm blankets provided, refreshed blankets, repositioned for comfort

## 2020-02-18 NOTE — ED Notes (Signed)
Presents with abd pain for the past 3 days, has been taking medication for UTI prescribed by a Urgent Care, has been having diarrhea as well.

## 2020-02-18 NOTE — ED Notes (Signed)
Still having urgency and frequency

## 2020-02-18 NOTE — ED Notes (Signed)
Resting quietly, states she feels better, daughter remains at bedside.

## 2020-02-18 NOTE — ED Notes (Signed)
[facility] Team at bedside 

## 2020-02-18 NOTE — ED Notes (Signed)
Covid Swab obtained and to the lab 

## 2020-02-18 NOTE — ED Triage Notes (Signed)
Dx with UTI 2 days ago. Started on antibiotic-levofloxican. Reports diarrhea that started yesterday.

## 2020-02-18 NOTE — ED Notes (Signed)
Defecated herself.  Perineal care, linen and gown changed with daughter's assistance.   New purewick applied.

## 2020-02-18 NOTE — ED Notes (Signed)
ED Provider at bedside. 

## 2020-02-18 NOTE — ED Provider Notes (Signed)
[facility] EMERGENCY DEPARTMENT Provider Note   CSN: [id number] Arrival date & time: 02/18/20  1426     History Chief Complaint  Patient presents with  . Urinary Tract Infection    Lisa Freeman is a 86 y.o. female.  The history is provided by the patient, a relative and medical records.  Urinary Tract Infection   Lisa Freeman is a 86 y.o. female who presents to the Emergency Department complaining of abdominal pain. She presents the emergency department accompanied by her daughter for evaluation of abdominal pain. She began feeling poorly on Thursday of last week with urinary urgency, frequency and abdominal pain. On Sunday she went to urgent care and was diagnosed with UTI off of a urine dipstick it was started on Levaquin. She is taken three doses of the antibiotic. Yesterday she developed worsening of abdominal pain with profuse diarrhea. No fevers, vomiting, nausea. Symptoms are severe and constant nature.    Past Medical History:  Diagnosis Date  . Hypertension     Patient Active Problem List   Diagnosis Date Noted  . Acute colitis 02/18/2020  . Pneumonia of right middle lobe due to infectious organism   . UTI (urinary tract infection) 01/12/2019  . AKI (acute kidney injury) (HCC) 01/12/2019  . Anxiety 01/12/2019  . Diarrhea 01/12/2019  . Abdominal pain 01/11/2019  . Colitis presumed infectious 12/27/2018  . Anemia   . Chronic anemia   . Insomnia   . Subclinical hyperthyroidism   . Anxiety state, unspecified   . Atrial fibrillation (HCC)   . Carotid stenosis   . Cerebral artery occlusion   . Diverticulosis of colon   . Dyshidrosis   . Other and unspecified hyperlipidemia   . Migraine   . Rosacea   . Unspecified venous (peripheral) insufficiency   . Urinary tract infection, site not specified 11/10/2013  . Urinary tract obstruction due to kidney stone 10/25/2012  . Hypertension     Past Surgical History:  Procedure Laterality Date  .  ABDOMINAL HYSTERECTOMY    . APPENDECTOMY    . BACK SURGERY    . CYSTOSCOPY W/ RETROGRADES Left 11/06/2012   Procedure: CYSTOSCOPY WITH RETROGRADE PYELOGRAM;  Surgeon: Lindaann SloughMarc-Henry Nesi, MD;  [facility] [place];  Service: Urology;  Laterality: Left;  . CYSTOSCOPY W/ URETERAL STENT REMOVAL Left 11/06/2012   Procedure: CYSTOSCOPY WITH STENT REMOVAL;  Surgeon: Lindaann SloughMarc-Henry Nesi, MD;  [facility] [place];  Service: Urology;  Laterality: Left;  . CYSTOSCOPY WITH RETROGRADE PYELOGRAM, URETEROSCOPY AND STENT PLACEMENT Left 10/25/2012   Procedure: CYSTOSCOPY/LEFT RETROGRADE PYELOGRAM/PLACEMENT LEFT URETERAL STENT;  Surgeon: Lindaann SloughMarc-Henry Nesi, MD;  Location: [facility];  Service: Urology;  Laterality: Left;  CYSTOSCOPY/LEFT RETROGRADE PYELOGRAM/URETEROSCOPY/PLACEMENT URETERAL STENT  . CYSTOSCOPY WITH STENT PLACEMENT Left 11/06/2012   Procedure: CYSTOSCOPY WITH STENT PLACEMENT;  Surgeon: Lindaann SloughMarc-Henry Nesi, MD;  [facility] [place];  Service: Urology;  Laterality: Left;  . CYSTOSCOPY WITH URETEROSCOPY AND STENT PLACEMENT Left 11/06/2012   Procedure: CYSTOSCOPY WITH URETEROSCOPY, STONE MANIPULATION;  Surgeon: Lindaann SloughMarc-Henry Nesi, MD;  [facility] [place];  Service: Urology;  Laterality: Left;  . HIP FRACTURE SURGERY Left      OB History   No obstetric history on file.     History reviewed. No pertinent family history.  Social History   Tobacco Use  . Smoking status: Never Smoker  . Smokeless tobacco: Never Used  Substance Use Topics  . Alcohol use: No  . Drug use: No    Home  Medications Prior to Admission medications   Medication Sig Start Date End Date Taking? Authorizing Provider  acetaminophen (TYLENOL) 500 MG tablet Take 1,000 mg by mouth every 6 (six) hours as needed for moderate pain.    [provider]  ALPRAZolam Prudy Feeler) 0.5 MG tablet Take 0.25-0.5 mg by mouth 2 (two) times daily as needed for anxiety.    [provider]  amLODipine (NORVASC) 5 MG tablet Take 5 mg by mouth daily.    [provider]  aspirin EC 81 MG tablet Take 81 mg by mouth daily.    [provider]  cephALEXin (KEFLEX) 250 MG capsule Take 1 capsule (250 mg total) by mouth 4 (four) times daily. 08/11/19   Jacalyn Lefevre, MD  folic acid (FOLVITE) 1 MG tablet Take 1 mg by mouth every evening.     [provider]  metoprolol succinate (TOPROL-XL) 25 MG 24 hr tablet Take 12.5 mg by mouth 2 (two) times daily.    [provider]  Olopatadine HCl (PATADAY) 0.2 % SOLN Place 1 drop into the right eye daily.     [provider]  prednisoLONE acetate (PRED FORTE) 1 % ophthalmic suspension Place 1 drop into the right eye at bedtime.     [provider]  senna (SENOKOT) 8.6 MG TABS tablet Take 1 tablet (8.6 mg total) by mouth daily as needed for mild constipation. 01/14/19   Zannie Cove, MD  sodium chloride (MURO 128) 2 % ophthalmic solution Place 1 drop into both eyes every 4 (four) hours as needed for eye irritation.    [provider]    Allergies    Doxycycline, Fluticasone propionate, Moxifloxacin, Nitrofurantoin monohyd macro, Penicillins, Promethazine, Sulfa antibiotics, Trimethoprim, and Codeine  Review of Systems   Review of Systems  All other systems reviewed and are negative.   Physical Exam Updated Vital Signs BP (!) 153/59 (BP Location: Left Arm)   Pulse 76   Temp 98.6 F (37 C) (Oral)   Resp 18   Ht 5' (1.524 m)   Wt 44.5 kg   SpO2 99%   BMI 19.14 kg/m   Physical Exam Vitals and nursing note reviewed.  Constitutional:      Appearance: She is well-developed.  HENT:     Head: Normocephalic and atraumatic.  Cardiovascular:     Rate and Rhythm: Normal rate and regular rhythm.     Heart sounds: No murmur heard.   Pulmonary:     Effort: Pulmonary effort is normal. No respiratory distress.     Breath sounds: Normal breath sounds.  Abdominal:     Palpations:  Abdomen is soft.     Tenderness: There is guarding. There is no rebound.     Comments: Significant right lower quadrant tenderness with mild generalized abdominal tenderness. Voluntary guarding  Musculoskeletal:        General: No tenderness.  Skin:    General: Skin is warm and dry.  Neurological:     Mental Status: She is alert and oriented to person, place, and time.     Comments: Very hard of hearing  Psychiatric:        Behavior: Behavior normal.     ED Results / Procedures / Treatments   Labs (all labs ordered are listed, but only abnormal results are displayed) Labs Reviewed  COMPREHENSIVE METABOLIC PANEL - Abnormal; Notable for the following components:      Result Value   Glucose, Bld 102 (*)    Creatinine, Ser 1.16 (*)  Albumin 3.4 (*)    GFR, Estimated 43 (*)    All other components within normal limits  CBC WITH DIFFERENTIAL/PLATELET - Abnormal; Notable for the following components:   WBC 12.8 (*)    RBC 3.49 (*)    Hemoglobin 10.6 (*)    HCT 33.1 (*)    Neutro Abs 9.6 (*)    All other components within normal limits  URINALYSIS, ROUTINE W REFLEX MICROSCOPIC - Abnormal; Notable for the following components:   Specific Gravity, Urine <1.005 (*)    Nitrite POSITIVE (*)    All other components within normal limits  URINALYSIS, MICROSCOPIC (REFLEX) - Abnormal; Notable for the following components:   Bacteria, UA MANY (*)    All other components within normal limits  RESPIRATORY PANEL BY RT PCR (FLU A&B, COVID)  URINE CULTURE  C DIFFICILE QUICK SCREEN W PCR REFLEX  GASTROINTESTINAL PANEL BY PCR, STOOL (REPLACES STOOL CULTURE)  LIPASE, BLOOD  LACTIC ACID, PLASMA    EKG None  Radiology CT Abdomen Pelvis W Contrast  Result Date: 02/18/2020 CLINICAL DATA:  86 year old female with abdominal pain for 3 days. Being treated for UTI. EXAM: CT ABDOMEN AND PELVIS WITH CONTRAST TECHNIQUE: Multidetector CT imaging of the abdomen and pelvis was performed using the  standard protocol following bolus administration of intravenous contrast. CONTRAST:  25mL OMNIPAQUE IOHEXOL 300 MG/ML  SOLN COMPARISON:  CT Abdomen and Pelvis 01/11/2019 and earlier. FINDINGS: Lower chest: Moderate size gastric hiatal hernia has not significantly changed. Calcified coronary artery atherosclerosis. Cardiac size at the upper limits of normal. No pericardial or pleural effusion. Mild to moderate chronic interstitial changes at both lung bases. Hepatobiliary: Liver and gallbladder are within normal limits. Pancreas: Negative. Spleen: Negative. Adrenals/Urinary Tract: Normal adrenal glands. Bilateral renal enhancement and contrast excretion is symmetric and within normal limits. No perinephric stranding. Larger renal collecting systems and proximal ureters, along with larger urinary bladder. Estimated bladder volume 300 mL. But no strong evidence of obstructive uropathy. No urinary calculus identified. Stomach/Bowel: Decompressed rectum. Abnormally thickened descending colon from the splenic flexure through to the proximal sigmoid. Superimposed diverticulosis of the proximal sigmoid, although the appearance more resembles colitis than diverticulitis. No extraluminal gas or fluid identified. Minor mesenteric stranding at the proximal descending colon. Transverse colon is redundant but has a more normal appearance. Retained stool at redundant hepatic flexure. Ascending colon diverticulosis without active inflammation. Oral contrast in the distal small bowel but not yet in the terminal ileum which appears within normal limits. Diminutive or absent appendix. No dilated small bowel. Intra-abdominal stomach within normal limits. Large chronic diverticulum of the duodenum, up to 5 cm on series 2, image 31. But no active inflammation or complicating features. The distal duodenum is decompressed. No free fluid. Vascular/Lymphatic: Aortoiliac calcified atherosclerosis. Tortuous aorta. Major arterial structures  appear patent. Portal venous system is patent. No lymphadenopathy. Reproductive: Chronically absent uterus, diminutive or absent ovaries. Other: No pelvic free fluid. Musculoskeletal: Osteopenia. Scoliosis. Advanced degenerative changes in the spine. Previous left femur ORIF. No acute osseous abnormality identified. IMPRESSION: 1. Thickened large bowel from the splenic flexure through the proximal sigmoid colon compatible with Acute Colitis. No perforation, abscess, or obstruction. 2. Superimposed diverticulosis of the sigmoid colon, ascending colon, and large proximal duodenal diverticulum, but no evidence of acute diverticulitis. 3. No other acute or inflammatory process identified in the abdomen or pelvis. Chronic moderate size gastric hiatal hernia. Aortic Atherosclerosis (ICD10-I70.0). Electronically Signed   By: Odessa Fleming M.D.   On: 02/18/2020 17:46  Procedures Procedures (including critical care time)  Medications Ordered in ED Medications  0.9 %  sodium chloride infusion ( Intravenous New Bag/Given 02/18/20 1905)  acetaminophen (TYLENOL) tablet 650 mg (650 mg Oral Given 02/18/20 2111)  lactated ringers infusion (has no administration in time range)  fentaNYL (SUBLIMAZE) injection 25 mcg (25 mcg Intravenous Given 02/18/20 1625)  sodium chloride 0.9 % bolus 500 mL (0 mLs Intravenous Stopped 02/18/20 1752)  ondansetron (ZOFRAN) injection 4 mg (4 mg Intravenous Given 02/18/20 1625)  iohexol (OMNIPAQUE) 300 MG/ML solution 100 mL (75 mLs Intravenous Contrast Given 02/18/20 1721)  cefTRIAXone (ROCEPHIN) 1 g in sodium chloride 0.9 % 100 mL IVPB (0 g Intravenous Stopped 02/18/20 1954)  metroNIDAZOLE (FLAGYL) IVPB 500 mg (0 mg Intravenous Stopped 02/18/20 2111)    ED Course  I have reviewed the triage vital signs and the nursing notes.  Pertinent labs & imaging results that were available during my care of the patient were reviewed by me and considered in my medical decision making (see chart for  details).    MDM Rules/Calculators/A&P                         patient here for evaluation of abdominal pain, urinary frequency, urgency and diarrhea. She does have tenderness on examination. UA is concerning for UTI. CT abdomen pelvis obtained, which demonstrates acute colitis. Colitis may be secondary to recent antibiotic use versus C diff, will send sample. She was treated with antibiotics for colitis and UTI pending further workup. Patient with multiple episodes of incontinence during her ED stay. Given her leukocytosis, comorbidities and severe symptoms recommend admission for ongoing treatment. Patient and daughter updated of findings of studies recommendation for admission and they are in agreement with treatment plan. Hospitalist consulted for admission. Final Clinical Impression(s) / ED Diagnoses Final diagnoses:  Colitis  Acute UTI  Generalized abdominal pain    Rx / DC Orders ED Discharge Orders    None       Tilden Fossa, MD 02/18/20 2330

## 2020-02-19 DIAGNOSIS — Z79899 Other long term (current) drug therapy: Secondary | ICD-10-CM | POA: Diagnosis not present

## 2020-02-19 DIAGNOSIS — R1030 Lower abdominal pain, unspecified: Secondary | ICD-10-CM

## 2020-02-19 DIAGNOSIS — I10 Essential (primary) hypertension: Secondary | ICD-10-CM | POA: Diagnosis present

## 2020-02-19 DIAGNOSIS — T368X5A Adverse effect of other systemic antibiotics, initial encounter: Secondary | ICD-10-CM | POA: Diagnosis present

## 2020-02-19 DIAGNOSIS — F419 Anxiety disorder, unspecified: Secondary | ICD-10-CM | POA: Diagnosis present

## 2020-02-19 DIAGNOSIS — R1084 Generalized abdominal pain: Secondary | ICD-10-CM | POA: Diagnosis not present

## 2020-02-19 DIAGNOSIS — N390 Urinary tract infection, site not specified: Secondary | ICD-10-CM | POA: Diagnosis present

## 2020-02-19 DIAGNOSIS — H9190 Unspecified hearing loss, unspecified ear: Secondary | ICD-10-CM | POA: Diagnosis present

## 2020-02-19 DIAGNOSIS — Z66 Do not resuscitate: Secondary | ICD-10-CM | POA: Diagnosis present

## 2020-02-19 DIAGNOSIS — R197 Diarrhea, unspecified: Secondary | ICD-10-CM | POA: Diagnosis not present

## 2020-02-19 DIAGNOSIS — Z20822 Contact with and (suspected) exposure to covid-19: Secondary | ICD-10-CM | POA: Diagnosis present

## 2020-02-19 DIAGNOSIS — K529 Noninfective gastroenteritis and colitis, unspecified: Principal | ICD-10-CM | POA: Diagnosis present

## 2020-02-19 DIAGNOSIS — Z1623 Resistance to quinolones and fluoroquinolones: Secondary | ICD-10-CM | POA: Diagnosis present

## 2020-02-19 DIAGNOSIS — Z7982 Long term (current) use of aspirin: Secondary | ICD-10-CM | POA: Diagnosis not present

## 2020-02-19 DIAGNOSIS — K521 Toxic gastroenteritis and colitis: Secondary | ICD-10-CM | POA: Diagnosis present

## 2020-02-19 DIAGNOSIS — Z880 Allergy status to penicillin: Secondary | ICD-10-CM | POA: Diagnosis not present

## 2020-02-19 DIAGNOSIS — B9620 Unspecified Escherichia coli [E. coli] as the cause of diseases classified elsewhere: Secondary | ICD-10-CM | POA: Diagnosis present

## 2020-02-19 LAB — GASTROINTESTINAL PANEL BY PCR, STOOL (REPLACES STOOL CULTURE)

## 2020-02-19 LAB — BASIC METABOLIC PANEL
Anion gap: 9 (ref 5–15)
BUN: 15 mg/dL (ref 8–23)
CO2: 23 mmol/L (ref 22–32)
Calcium: 8.9 mg/dL (ref 8.9–10.3)
Chloride: 109 mmol/L (ref 98–111)
Creatinine, Ser: 0.84 mg/dL (ref 0.44–1.00)
GFR, Estimated: >60 mL/min (ref >60)
Glucose, Bld: 89 mg/dL (ref 70–99)
Potassium: 3.9 mmol/L (ref 3.5–5.1)
Sodium: 141 mmol/L (ref 135–145)

## 2020-02-19 LAB — CBC
HCT: 32.4 % — ABNORMAL LOW (ref 36.0–46.0)
Hemoglobin: 10.2 g/dL — ABNORMAL LOW (ref 12.0–15.0)
MCH: 30.3 pg (ref 26.0–34.0)
MCHC: 31.5 g/dL (ref 30.0–36.0)
MCV: 96.1 fL (ref 80.0–100.0)
Platelets: 228 10*3/uL (ref 150–400)
RBC: 3.37 MIL/uL — ABNORMAL LOW (ref 3.87–5.11)
RDW: 13.7 % (ref 11.5–15.5)
WBC: 9.8 10*3/uL (ref 4.0–10.5)
nRBC: 0 % (ref 0.0–0.2)

## 2020-02-19 MED ORDER — METOPROLOL SUCCINATE ER 25 MG PO TB24
12.5000 mg | ORAL_TABLET | Freq: Two times a day (BID) | ORAL | Status: DC
  Administered 2020-02-19 – 2020-02-24 (×11): 12.5 mg via ORAL
  Filled 2020-02-19 (×11): qty 1

## 2020-02-19 MED ORDER — METRONIDAZOLE IN NACL 5-0.79 MG/ML-% IV SOLN
500.0000 mg | Freq: Three times a day (TID) | INTRAVENOUS | Status: DC
  Administered 2020-02-20 (×2): 500 mg via INTRAVENOUS
  Filled 2020-02-19 (×2): qty 100

## 2020-02-19 MED ORDER — SENNOSIDES-DOCUSATE SODIUM 8.6-50 MG PO TABS: 1.0000 | ORAL_TABLET | Freq: Every evening | ORAL | Status: DC | PRN

## 2020-02-19 MED ORDER — ACETAMINOPHEN 500 MG PO TABS: 1000.0000 mg | ORAL_TABLET | Freq: Four times a day (QID) | ORAL | Status: DC | PRN

## 2020-02-19 MED ORDER — ASPIRIN EC 81 MG PO TBEC
81.0000 mg | DELAYED_RELEASE_TABLET | Freq: Every day | ORAL | Status: DC
  Administered 2020-02-19 – 2020-02-24 (×6): 81 mg via ORAL
  Filled 2020-02-19 (×6): qty 1

## 2020-02-19 MED ORDER — ALPRAZOLAM 0.25 MG PO TABS
0.2500 mg | ORAL_TABLET | Freq: Two times a day (BID) | ORAL | Status: DC | PRN
  Administered 2020-02-22: 0.25 mg via ORAL
  Filled 2020-02-19: qty 1

## 2020-02-19 MED ORDER — ENOXAPARIN SODIUM 30 MG/0.3ML ~~LOC~~ SOLN
30.0000 mg | SUBCUTANEOUS | Status: DC
  Administered 2020-02-19 – 2020-02-24 (×6): 30 mg via SUBCUTANEOUS
  Filled 2020-02-19 (×6): qty 0.3

## 2020-02-19 MED ORDER — OLOPATADINE HCL 0.1 % OP SOLN
1.0000 [drp] | Freq: Two times a day (BID) | OPHTHALMIC | Status: DC
  Administered 2020-02-19 – 2020-02-24 (×11): 1 [drp] via OPHTHALMIC
  Filled 2020-02-19: qty 5

## 2020-02-19 MED ORDER — AMLODIPINE BESYLATE 5 MG PO TABS
5.0000 mg | ORAL_TABLET | Freq: Every day | ORAL | Status: DC
  Administered 2020-02-19 – 2020-02-24 (×6): 5 mg via ORAL
  Filled 2020-02-19 (×6): qty 1

## 2020-02-19 MED ORDER — ACETAMINOPHEN 325 MG PO TABS
650.0000 mg | ORAL_TABLET | Freq: Four times a day (QID) | ORAL | Status: DC | PRN
  Administered 2020-02-21 – 2020-02-23 (×3): 650 mg via ORAL
  Filled 2020-02-19 (×2): qty 2

## 2020-02-19 MED ORDER — SODIUM CHLORIDE 0.9 % IV SOLN
1.0000 g | INTRAVENOUS | Status: DC
  Administered 2020-02-19 – 2020-02-20 (×2): 1 g via INTRAVENOUS
  Filled 2020-02-19 (×2): qty 1

## 2020-02-19 MED ORDER — PREDNISOLONE ACETATE 1 % OP SUSP
1.0000 [drp] | Freq: Every day | OPHTHALMIC | Status: DC
  Administered 2020-02-19 – 2020-02-23 (×5): 1 [drp] via OPHTHALMIC
  Filled 2020-02-19: qty 5

## 2020-02-19 MED ORDER — FOLIC ACID 1 MG PO TABS
1.0000 mg | ORAL_TABLET | Freq: Every evening | ORAL | Status: DC
  Administered 2020-02-19 – 2020-02-23 (×5): 1 mg via ORAL
  Filled 2020-02-19 (×5): qty 1

## 2020-02-19 MED ORDER — ACETAMINOPHEN 650 MG RE SUPP: 650.0000 mg | Freq: Four times a day (QID) | RECTAL | Status: DC | PRN

## 2020-02-19 MED ORDER — ONDANSETRON HCL 4 MG/2ML IJ SOLN: 4.0000 mg | Freq: Four times a day (QID) | INTRAMUSCULAR | Status: DC | PRN

## 2020-02-19 MED ORDER — ONDANSETRON HCL 4 MG PO TABS
4.0000 mg | ORAL_TABLET | Freq: Four times a day (QID) | ORAL | Status: DC | PRN
  Administered 2020-02-21: 4 mg via ORAL
  Filled 2020-02-19: qty 1

## 2020-02-19 NOTE — Progress Notes (Signed)
No charge note  Patient seen and examined this morning, admitted overnight, H&P reviewed and agree with assessment and plan.  In brief, she was placed on Levaquin for UTI and 3 days into her treatment started having abdominal pain, nausea vomiting as well as diarrhea. She came to the ED, CT scan on admission showed colitis in the large intestine from the splenic flexure to the sigmoid colon. C. difficile has been ordered on admission however it was collected but never sent.  He is feeling a little bit better this morning, abdomen is still sore. We will see how she tolerates clear liquids and perhaps we could slowly advance her diet  Currently she is on antibiotics with ceftriaxone along with metronidazole, continue. Continue IV fluids. Attempt to send C. difficile again  Scheduled Meds: . amLODipine  5 mg Oral Daily  . aspirin EC  81 mg Oral Daily  . enoxaparin (LOVENOX) injection  30 mg Subcutaneous Q24H  . folic acid  1 mg Oral QPM  . metoprolol succinate  12.5 mg Oral BID  . olopatadine  1 drop Right Eye BID  . prednisoLONE acetate  1 drop Right Eye QHS   Continuous Infusions: . sodium chloride 10 mL/hr at 02/18/20 1905  . cefTRIAXone (ROCEPHIN)  IV    . lactated ringers 75 mL/hr at 02/19/20 0700  . [START ON 02/20/2020] metronidazole     PRN Meds:.sodium chloride, acetaminophen **OR** acetaminophen, acetaminophen, ALPRAZolam, ondansetron **OR** ondansetron (ZOFRAN) IV, senna-docusate  Ariella Voit M. Elvera Lennox, MD, PhD [facility]  Between 7 am - 7 pm you can contact me via Amion or Securechat.  I am not available 7 pm - 7 am, please contact night coverage MD/APP via Amion

## 2020-02-19 NOTE — H&P (Signed)
[facility] History & Physical  Vinson Moselle MD  Alfa Leibensperger 02/19/2020  Chief Complaint: Abd pain and diarrhea HPI: The patient is a 86 y.o. year-old w/ hx of HTN presented to ED at [facility] for abd pain and diarrhea that started 2 days after starting abx po (levaquin) for a UTI.  In ED BP 150 83, HR 70's, RR 18- 20, temp 98. RA 99%. CT abdomen showed "colitis" in the large intestine from the splenic flexure to the sigmoid colon. WBC in ED was up. Given comorbidities pt was felt to require admission. Asked to see for admission.    Pt seen in room.  States she is "feeling a lot better", abd pain is better and overall better since coming to ED.  As per ED note, the dtr had told them that last week on Thursday pt had UTI symptoms and was started on Levaquin. She took 3 doses then developed worsening abd pain w/ profuse diarrhea for the last 48hrs. Pt does not give much history tonight, she is sleepy and not a great historian.   Pt denies any chest pain, SOB, fevers, chills , sweats, n/v or headaches.   ROS  denies CP  no joint pain   no HA  no blurry vision  no rash    Past Medical History  Past Medical History:  Diagnosis Date  . Hypertension    Past Surgical History  Past Surgical History:  Procedure Laterality Date  . ABDOMINAL HYSTERECTOMY    . APPENDECTOMY    . BACK SURGERY    . CYSTOSCOPY W/ RETROGRADES Left 11/06/2012   Procedure: CYSTOSCOPY WITH RETROGRADE PYELOGRAM;  Surgeon: Lindaann Slough, MD;  Location: [facility];  Service: Urology;  Laterality: Left;  . CYSTOSCOPY W/ URETERAL STENT REMOVAL Left 11/06/2012   Procedure: CYSTOSCOPY WITH STENT REMOVAL;  Surgeon: Lindaann Slough, MD;  Location: [facility] [place];  Service: Urology;  Laterality: Left;  . CYSTOSCOPY WITH RETROGRADE PYELOGRAM, URETEROSCOPY AND STENT PLACEMENT Left 10/25/2012   Procedure: CYSTOSCOPY/LEFT RETROGRADE PYELOGRAM/PLACEMENT LEFT URETERAL STENT;  Surgeon: Lindaann Slough, MD;  Location: [facility];  Service: Urology;  Laterality: Left;  CYSTOSCOPY/LEFT RETROGRADE PYELOGRAM/URETEROSCOPY/PLACEMENT URETERAL STENT  . CYSTOSCOPY WITH STENT PLACEMENT Left 11/06/2012   Procedure: CYSTOSCOPY WITH STENT PLACEMENT;  Surgeon: Lindaann Slough, MD;  Location: [facility] [place];  Service: Urology;  Laterality: Left;  . CYSTOSCOPY WITH URETEROSCOPY AND STENT PLACEMENT Left 11/06/2012   Procedure: CYSTOSCOPY WITH URETEROSCOPY, STONE MANIPULATION;  Surgeon: Lindaann Slough, MD;  Location: [facility] ;  Service: Urology;  Laterality: Left;  . HIP FRACTURE SURGERY Left    Family History History reviewed. No pertinent family history. Social History  reports that she has never smoked. She has never used smokeless tobacco. She reports that she does not drink alcohol and does not use drugs. Allergies  Allergies  Allergen Reactions  . Doxycycline Shortness Of Breath  . Fluticasone Propionate Shortness Of Breath  . Moxifloxacin Shortness Of Breath  . Nitrofurantoin Monohyd Macro Shortness Of Breath  . Penicillins Shortness Of Breath    Did it involve swelling of the face/tongue/throat, SOB, or low BP? Unknown Did it involve sudden or severe rash/hives, skin peeling, or any reaction on the inside of your mouth or nose? Unknown Did you need to seek medical attention at a hospital or doctor's office? Unknown When did it last happen? If all above answers are "NO", may proceed with cephalosporin use.   . Promethazine Shortness Of Breath  .  Sulfa Antibiotics Shortness Of Breath  . Trimethoprim Shortness Of Breath  . Codeine Nausea And Vomiting   Home medications Prior to Admission medications   Medication Sig Start Date End Date Taking? Authorizing Provider  acetaminophen (TYLENOL) 500 MG tablet Take 1,000 mg by mouth every 6 (six) hours as needed for moderate pain.   Yes [provider]  ALPRAZolam (XANAX) 0.5 MG tablet Take 0.25-0.5 mg by  mouth 2 (two) times daily as needed for anxiety.   Yes [provider]  amLODipine (NORVASC) 5 MG tablet Take 5 mg by mouth daily.   Yes [provider]  aspirin EC 81 MG tablet Take 81 mg by mouth daily.   Yes [provider]  folic acid (FOLVITE) 1 MG tablet Take 1 mg by mouth every evening.    Yes [provider]  levofloxacin (LEVAQUIN) 250 MG tablet Take 250 mg by mouth daily. For 7 days 02/16/20  Yes [provider]  metoprolol succinate (TOPROL-XL) 25 MG 24 hr tablet Take 12.5 mg by mouth 2 (two) times daily.   Yes [provider]  Olopatadine HCl (PATADAY) 0.2 % SOLN Place 1 drop into the right eye daily.    Yes [provider]  prednisoLONE acetate (PRED FORTE) 1 % ophthalmic suspension Place 1 drop into the right eye at bedtime.    Yes [provider]  sodium chloride (MURO 128) 2 % ophthalmic solution Place 1 drop into both eyes every 4 (four) hours as needed for eye irritation.   Yes [provider]  cephALEXin (KEFLEX) 250 MG capsule Take 1 capsule (250 mg total) by mouth 4 (four) times daily. Patient not taking: Reported on 02/18/2020 08/11/19   Jacalyn Lefevre, MD  senna (SENOKOT) 8.6 MG TABS tablet Take 1 tablet (8.6 mg total) by mouth daily as needed for mild constipation. Patient not taking: Reported on 02/18/2020 01/14/19   Zannie Cove, MD       Exam Gen elderly somewhat frail WF lying in hosp bed at 35 deg, no distress No rash, cyanosis or gangrene Sclera anicteric, throat clear and mois  No jvd or bruits  Chest clear bilat to bases no rales RRR no MRG Abd soft ntnd no mass or ascites +bs, mild L sided abd tenderness GU defer MS no joint effusions or deformity Ext no leg or UE edema, no wounds or ulcers Neuro is alert, Ox 2, nonfocal    Home meds:  - norvasc 5/ asa 81/ metoprolol xl 25 qd  - xanax bid prn/ senna prn  - prn's/ vitamins/ supplements       CT abd - IMPRESSION:  1.  Thickened large bowel from the splenic flexure through the proximal sigmoid colon compatible with Acute Colitis. No perforation, abscess, or obstruction. 2. Superimposed diverticulosis of the sigmoid colon, ascending colon, and large proximal duodenal diverticulum, but no evidence of acute diverticulitis. 3. No other acute or inflammatory process identified in the abdomen or pelvis. Chronic moderate size gastric hiatal hernia.     UA 11/9 - many bact, 0-5 wbc/ rbc / epi, neg LE, +nitrite     Na 137 K 4.1  Creat 1.16  Alb 3.4  LFT's okay  WBC 12K Hb 10.6     COVID negative      GI panel - pending       UCx - pending    Assessment/ Plan: 1. Colitis / diarrhea/ abd pain - after taking levaquin for a UTI. Colitis noted from splenic  flexure to sigmoid by CT scan.  Pt given IVF's and IV abx and feeling better this evening. Plan admit, cont IVF's and cont IV abx.   2. UTI - recently dx'd, will repeat the culture.  Abx for #1 should cover this 3. Diarrhea - Cdif sent and GI panel sent this evening 4. HTN - BP's are not low, cont home norvasc/ metoprolol    Vinson Moselle  MD 02/19/2020, 12:57 AM

## 2020-02-20 MED ORDER — METRONIDAZOLE 500 MG PO TABS
500.0000 mg | ORAL_TABLET | Freq: Three times a day (TID) | ORAL | Status: DC
  Administered 2020-02-20 – 2020-02-24 (×11): 500 mg via ORAL
  Filled 2020-02-20 (×11): qty 1

## 2020-02-20 NOTE — Progress Notes (Addendum)
Pharmacy IV to PO   This patient is receiving the antibiotic(s) metronidazole by the intravenous route. Based on criteria approved by the Pharmacy and Therapeutics Committee, and the Infectious Disease Division, the antibiotic(s) is / are being converted to equivalent oral dose form(s).   Metronidazole IV is on national backorder. Since metronidazole is 100 percent bioavailable and Lisa Freeman has taken oral medications this morning will convert to oral.     Sharin Mons, PharmD, BCPS, BCIDP Infectious Diseases Clinical Pharmacist Phone: [phone] 02/20/2020 12:02 PM

## 2020-02-20 NOTE — Plan of Care (Signed)
  Problem: Nutrition: Goal: Adequate nutrition will be maintained Outcome: Progressing   Problem: Safety: Goal: Ability to remain free from injury will improve Outcome: Progressing   Problem: Skin Integrity: Goal: Risk for impaired skin integrity will decrease Outcome: Progressing   

## 2020-02-20 NOTE — Progress Notes (Signed)
PROGRESS NOTE  Lisa Freeman ZYS:[id number] DOB: [birth year] DOA: 02/18/2020 PCP: Bailey Mech, PA-C   LOS: 1 day   Brief Narrative / Interim history: 86 year old female with history of HTN, came in with abdominal pain, diarrhea that started 3 days after starting oral Levaquin for UTI.  CT on admission showed colitis, and due to elevated WBC she was admitted to the hospital and placed on antibiotics.  Subjective / 24h Interval events: Says that feels better, less sore in her abdomen.  Wants to try a little bit more food  Assessment & Plan: Principal Problem Acute colitis -Unclear etiology, possibly a degree of bacterial imbalance due to antibiotic usage.  Her diarrhea has resolved and we are unable to rule out C. difficile, however GI pathogen panel has been run and it was negative.  I am not sure why C. difficile was not run, there is no documentation in the chart.  She has been empirically placed on ceftriaxone along with the metronidazole, clinically is improving and will continue that for now -Advance diet, she is tolerating full liquids well and will allow soft  Active Problems UTI -Antibiotics for #1 cover for this, she received few days prior to being in the hospital, urine culture shows E. coli with sensitivities pending  Essential hypertension -Continue Norvasc, metoprolol  Scheduled Meds: . amLODipine  5 mg Oral Daily  . aspirin EC  81 mg Oral Daily  . enoxaparin (LOVENOX) injection  30 mg Subcutaneous Q24H  . folic acid  1 mg Oral QPM  . metoprolol succinate  12.5 mg Oral BID  . metroNIDAZOLE  500 mg Oral Q8H  . olopatadine  1 drop Right Eye BID  . prednisoLONE acetate  1 drop Right Eye QHS   Continuous Infusions: . sodium chloride 10 mL/hr at 02/20/20 0439  . cefTRIAXone (ROCEPHIN)  IV 1 g (02/19/20 2216)  . lactated ringers 75 mL/hr at 02/20/20 0829   PRN Meds:.sodium chloride, acetaminophen **OR** acetaminophen, acetaminophen, ALPRAZolam,  ondansetron **OR** ondansetron (ZOFRAN) IV, senna-docusate  Diet Orders (From admission, onward)    Start     Ordered   02/20/20 0843  Diet regular Room service appropriate? Yes; Fluid consistency: Thin  Diet effective now       Question Answer Comment  Room service appropriate? Yes   Fluid consistency: Thin      02/20/20 0842          DVT prophylaxis: enoxaparin (LOVENOX) injection 30 mg Start: 02/19/20 1000     Code Status: Full Code  Family Communication: daughter at bedside   Status is: Inpatient  Remains inpatient appropriate because:Inpatient level of care appropriate due to severity of illness   Dispo: The patient is from: Home              Anticipated d/c is to: Home              Anticipated d/c date is: 1 day              Patient currently is not medically stable to d/c.  Consultants:  none  Procedures:  None   Microbiology  Urine cultures  Antimicrobials: Ceftriaxone / metronidazole     Objective: Vitals:   02/19/20 1320 02/19/20 2203 02/20/20 0436 02/20/20 0500  BP: (!) 150/50 (!) 158/59 (!) 157/55   Pulse: 60 62 63   Resp: 17 16 16    Temp: 98.1 F (36.7 C) 98 F (36.7 C) 98.4 F (36.9 C)   TempSrc:  Oral Oral  SpO2: 98% 97% 96%   Weight:    46.7 kg  Height:        Intake/Output Summary (Last 24 hours) at 02/20/2020 1314 Last data filed at 02/20/2020 0946 Gross per 24 hour  Intake 1101.48 ml  Output 1950 ml  Net -848.52 ml   Filed Weights   02/18/20 1441 02/20/20 0500  Weight: 44.5 kg 46.7 kg    Examination:  Constitutional: NAD Eyes: no scleral icterus ENMT: Mucous membranes are moist.  Neck: normal, supple Respiratory: clear to auscultation bilaterally, no wheezing, no crackles.  Cardiovascular: Regular rate and rhythm, no murmurs / rubs / gallops. No LE edema.  Abdomen: non distended, no tenderness. Bowel sounds positive.  Musculoskeletal: no clubbing / cyanosis.  Skin: no rashes Neurologic: CN 2-12 grossly intact.  Strength 5/5 in all 4.    Data Reviewed: I have independently reviewed following labs and imaging studies   CBC: Recent Labs  Lab 02/18/20 1614 02/19/20 0708  WBC 12.8* 9.8  NEUTROABS 9.6*  --   HGB 10.6* 10.2*  HCT 33.1* 32.4*  MCV 94.8 96.1  PLT 247 228   Basic Metabolic Panel: Recent Labs  Lab 02/18/20 1614 02/19/20 0708  NA 137 141  K 4.1 3.9  CL 105 109  CO2 23 23  GLUCOSE 102* 89  BUN 21 15  CREATININE 1.16* 0.84  CALCIUM 9.7 8.9   Liver Function Tests: Recent Labs  Lab 02/18/20 1614  AST 17  ALT 16  ALKPHOS 75  BILITOT 0.4  PROT 6.7  ALBUMIN 3.4*   Coagulation Profile: No results for input(s): INR, PROTIME in the last 168 hours. HbA1C: No results for input(s): HGBA1C in the last 72 hours. CBG: No results for input(s): GLUCAP in the last 168 hours.  Recent Results (from the past 240 hour(s))  Urine culture     Status: Abnormal (Preliminary result)   Collection Time: 02/18/20  5:25 PM   Specimen: Urine, Random  Result Value Ref Range Status   Specimen Description   Final    URINE, RANDOM Performed at [facility], [address] [place], [place] [address]    Special Requests   Final    NONE Performed at [facility], [address] [place], [place] [address]    Culture (A)  Final    >=100,000 COLONIES/mL ESCHERICHIA COLI CULTURE REINCUBATED FOR BETTER GROWTH Performed at [facility], [address], [place], [place] [address]    Report Status PENDING  Incomplete  Gastrointestinal Panel by PCR , Stool     Status: None   Collection Time: 02/18/20  7:58 PM   Specimen: Stool  Result Value Ref Range Status   Campylobacter species NOT DETECTED NOT DETECTED Final   Plesimonas shigelloides NOT DETECTED NOT DETECTED Final   Salmonella species NOT DETECTED NOT DETECTED Final   Yersinia enterocolitica NOT DETECTED NOT DETECTED Final   Vibrio species NOT DETECTED NOT DETECTED Final   Vibrio cholerae NOT DETECTED NOT  DETECTED Final   Enteroaggregative E coli (EAEC) NOT DETECTED NOT DETECTED Final   Enteropathogenic E coli (EPEC) NOT DETECTED NOT DETECTED Final   Enterotoxigenic E coli (ETEC) NOT DETECTED NOT DETECTED Final   Shiga like toxin producing E coli (STEC) NOT DETECTED NOT DETECTED Final   Shigella/Enteroinvasive E coli (EIEC) NOT DETECTED NOT DETECTED Final   Cryptosporidium NOT DETECTED NOT DETECTED Final   Cyclospora cayetanensis NOT DETECTED NOT DETECTED Final   Entamoeba histolytica NOT DETECTED  NOT DETECTED Final   Giardia lamblia NOT DETECTED NOT DETECTED Final   Adenovirus F40/41 NOT DETECTED NOT DETECTED Final   Astrovirus NOT DETECTED NOT DETECTED Final   Norovirus GI/GII NOT DETECTED NOT DETECTED Final   Rotavirus A NOT DETECTED NOT DETECTED Final   Sapovirus (I, II, IV, and V) NOT DETECTED NOT DETECTED Final    Comment: Performed at [facility], [address] [place], [place] [address]  Respiratory Panel by RT PCR (Flu A&B, Covid) -     Status: None   Collection Time: 02/18/20  8:51 PM   Specimen: Nasopharyngeal  Result Value Ref Range Status   SARS Coronavirus 2 by RT PCR NEGATIVE NEGATIVE Final    Comment: (NOTE) SARS-CoV-2 target nucleic acids are NOT DETECTED.  The SARS-CoV-2 RNA is generally detectable in upper respiratoy specimens during the acute phase of infection. The lowest concentration of SARS-CoV-2 viral copies this assay can detect is 131 copies/mL. A negative result does not preclude SARS-Cov-2 infection and should not be used as the sole basis for treatment or other patient management decisions. A negative result may occur with  improper specimen collection/handling, submission of specimen other than nasopharyngeal swab, presence of viral mutation(s) within the areas targeted by this assay, and inadequate number of viral copies (<131 copies/mL). A negative result must be combined with clinical observations, patient history, and epidemiological  information. The expected result is Negative.  Fact Sheet for Patients:  https://www.moore.com/  Fact Sheet for Healthcare Providers:  https://www.young.biz/  This test is no t yet approved or cleared by the [place] FDA and  has been authorized for detection and/or diagnosis of SARS-CoV-2 by FDA under an Emergency Use Authorization (EUA). This EUA will remain  in effect (meaning this test can be used) for the duration of the COVID-19 declaration under Section 564(b)(1) of the Act, 21 U.S.C. section 360bbb-3(b)(1), unless the authorization is terminated or revoked sooner.     Influenza A by PCR NEGATIVE NEGATIVE Final   Influenza B by PCR NEGATIVE NEGATIVE Final    Comment: (NOTE) The Xpert Xpress SARS-CoV-2/FLU/RSV assay is intended as an aid in  the diagnosis of influenza from Nasopharyngeal swab specimens and  should not be used as a sole basis for treatment. Nasal washings and  aspirates are unacceptable for Xpert Xpress SARS-CoV-2/FLU/RSV  testing.  Fact Sheet for Patients: https://www.moore.com/  Fact Sheet for Healthcare Providers: https://www.young.biz/  This test is not yet approved or cleared by the [place] FDA and  has been authorized for detection and/or diagnosis of SARS-CoV-2 by  FDA under an Emergency Use Authorization (EUA). This EUA will remain  in effect (meaning this test can be used) for the duration of the  Covid-19 declaration under Section 564(b)(1) of the Act, 21  U.S.C. section 360bbb-3(b)(1), unless the authorization is  terminated or revoked. Performed at [facility], [address], [place], [place] [address]      Radiology Studies: No results found.  Pamella Pert, MD, PhD [facility]  Between 7 am - 7 pm I am available, please contact me via Amion or Securechat  Between 7 pm - 7 am I am not available, please contact night  coverage MD/APP via Amion

## 2020-02-20 NOTE — Progress Notes (Signed)
Patient did not have a BM to send to lab for testing. Will continue to monitor the patient.

## 2020-02-21 ENCOUNTER — Inpatient Hospital Stay (HOSPITAL_COMMUNITY): Payer: Medicare Other

## 2020-02-21 LAB — CBC
HCT: 30.5 % — ABNORMAL LOW (ref 36.0–46.0)
Hemoglobin: 9.4 g/dL — ABNORMAL LOW (ref 12.0–15.0)
MCH: 30.6 pg (ref 26.0–34.0)
MCHC: 30.8 g/dL (ref 30.0–36.0)
MCV: 99.3 fL (ref 80.0–100.0)
Platelets: 210 10*3/uL (ref 150–400)
RBC: 3.07 MIL/uL — ABNORMAL LOW (ref 3.87–5.11)
RDW: 13.7 % (ref 11.5–15.5)
WBC: 8.5 10*3/uL (ref 4.0–10.5)
nRBC: 0 % (ref 0.0–0.2)

## 2020-02-21 LAB — URINE CULTURE: Culture: >=100000 — AB

## 2020-02-21 LAB — BASIC METABOLIC PANEL
Anion gap: 9 (ref 5–15)
BUN: 11 mg/dL (ref 8–23)
CO2: 22 mmol/L (ref 22–32)
Calcium: 8.8 mg/dL — ABNORMAL LOW (ref 8.9–10.3)
Chloride: 107 mmol/L (ref 98–111)
Creatinine, Ser: 0.81 mg/dL (ref 0.44–1.00)
GFR, Estimated: >60 mL/min (ref >60)
Glucose, Bld: 94 mg/dL (ref 70–99)
Potassium: 4 mmol/L (ref 3.5–5.1)
Sodium: 138 mmol/L (ref 135–145)

## 2020-02-21 MED ORDER — SODIUM CHLORIDE 0.9 % IV SOLN
2.0000 g | INTRAVENOUS | Status: DC
  Administered 2020-02-21 – 2020-02-23 (×3): 2 g via INTRAVENOUS
  Filled 2020-02-21 (×2): qty 20
  Filled 2020-02-21: qty 2
  Filled 2020-02-21: qty 20

## 2020-02-21 NOTE — Progress Notes (Signed)
PROGRESS NOTE  Lisa BinetSavannah Freeman ZOX:[id number] DOB: Jul 16, 1918 DOA: 02/18/2020 PCP: Bailey MechPodraza, Cole Christopher, PA-C   LOS: 2 days   Brief Narrative / Interim history: 86 year old female with history of HTN, came in with abdominal pain, diarrhea that started 3 days after starting oral Levaquin for UTI.  CT on admission showed colitis, and due to elevated WBC she was admitted to the hospital and placed on antibiotics.  Subjective / 24h Interval events: Had an episode of nausea this morning after receiving Tylenol.  She feels better now, was able to tolerate breakfast  Assessment & Plan: Principal Problem Acute colitis -Unclear etiology, possibly a degree of bacterial imbalance due to antibiotic usage.  Her diarrhea has resolved and we are unable to rule out C. difficile, however GI pathogen panel has been run and it was negative.  I am not sure why C. difficile was not run, there is no documentation in the chart.  She has been empirically placed on ceftriaxone along with the metronidazole, she is improving and continue that for now -Tolerating a soft diet  Active Problems UTI -Urine cultures speciated E. coli which unfortunately is resistant to fluoroquinolones.  Levaquin is about the only oral medication that she tolerated in the past.  She seems to be tolerating IV ceftriaxone, continue that for now.  Essential hypertension -Continue Norvasc, metoprolol  Multiple allergies -Daughter tells me that the only true allergy is with penicillin, has shortness of breath, mostly other allergies listed are related to GI upset  Deconditioning -Patient weaker than her baseline, awaiting physical therapy consultation, may need SNF.   Scheduled Meds: . amLODipine  5 mg Oral Daily  . aspirin EC  81 mg Oral Daily  . enoxaparin (LOVENOX) injection  30 mg Subcutaneous Q24H  . folic acid  1 mg Oral QPM  . metoprolol succinate  12.5 mg Oral BID  . metroNIDAZOLE  500 mg Oral Q8H  . olopatadine  1  drop Right Eye BID  . prednisoLONE acetate  1 drop Right Eye QHS   Continuous Infusions: . sodium chloride 10 mL/hr at 02/20/20 0439  . cefTRIAXone (ROCEPHIN)  IV    . lactated ringers 75 mL/hr at 02/21/20 0454   PRN Meds:.sodium chloride, acetaminophen **OR** acetaminophen, acetaminophen, ALPRAZolam, ondansetron **OR** ondansetron (ZOFRAN) IV, senna-docusate  Diet Orders (From admission, onward)    Start     Ordered   02/20/20 0843  Diet regular Room service appropriate? Yes; Fluid consistency: Thin  Diet effective now       Question Answer Comment  Room service appropriate? Yes   Fluid consistency: Thin      02/20/20 0842          DVT prophylaxis: enoxaparin (LOVENOX) injection 30 mg Start: 02/19/20 1000     Code Status: Full Code  Family Communication: daughter at bedside   Status is: Inpatient  Remains inpatient appropriate because:Inpatient level of care appropriate due to severity of illness   Dispo: The patient is from: Home              Anticipated d/c is to: Home              Anticipated d/c date is: 1 day              Patient currently is not medically stable to d/c.  Consultants:  none  Procedures:  None   Microbiology  Urine cultures  Antimicrobials: Ceftriaxone / metronidazole     Objective: Vitals:   02/20/20 2000 02/21/20 0425  02/21/20 0500 02/21/20 0920  BP: (!) 162/57 (!) 175/64  (!) 170/48  Pulse: 67 63  63  Resp: 16 16    Temp: 98.1 F (36.7 C) 98.7 F (37.1 C)    TempSrc: Oral Oral    SpO2: 96% 96%    Weight:   44.5 kg   Height:        Intake/Output Summary (Last 24 hours) at 02/21/2020 1049 Last data filed at 02/21/2020 1043 Gross per 24 hour  Intake 1660 ml  Output 1650 ml  Net 10 ml   Filed Weights   02/18/20 1441 02/20/20 0500 02/21/20 0500  Weight: 44.5 kg 46.7 kg 44.5 kg    Examination:  Constitutional: No distress Eyes: No icterus ENMT: mmm Neck: normal, supple Respiratory: Lungs are clear bilaterally  without wheezing or crackles, slightly diminished at the bases Cardiovascular: Regular rate and rhythm, no murmurs, no peripheral edema Abdomen: Minimally tender to palpation throughout, no guarding, no rebound, bowel sounds positive Musculoskeletal: no clubbing / cyanosis.  Skin: No rashes Neurologic: No focal deficits   Data Reviewed: I have independently reviewed following labs and imaging studies   CBC: Recent Labs  Lab 02/18/20 1614 02/19/20 0708 02/21/20 0358  WBC 12.8* 9.8 8.5  NEUTROABS 9.6*  --   --   HGB 10.6* 10.2* 9.4*  HCT 33.1* 32.4* 30.5*  MCV 94.8 96.1 99.3  PLT 247 228 210   Basic Metabolic Panel: Recent Labs  Lab 02/18/20 1614 02/19/20 0708 02/21/20 0358  NA 137 141 138  K 4.1 3.9 4.0  CL 105 109 107  CO2 23 23 22   GLUCOSE 102* 89 94  BUN 21 15 11   CREATININE 1.16* 0.84 0.81  CALCIUM 9.7 8.9 8.8*   Liver Function Tests: Recent Labs  Lab 02/18/20 1614  AST 17  ALT 16  ALKPHOS 75  BILITOT 0.4  PROT 6.7  ALBUMIN 3.4*   Coagulation Profile: No results for input(s): INR, PROTIME in the last 168 hours. HbA1C: No results for input(s): HGBA1C in the last 72 hours. CBG: No results for input(s): GLUCAP in the last 168 hours.  Recent Results (from the past 240 hour(s))  Urine culture     Status: Abnormal   Collection Time: 02/18/20  5:25 PM   Specimen: Urine, Random  Result Value Ref Range Status   Specimen Description   Final    URINE, RANDOM Performed at [facility], [address] [place], [address] [place]    Special Requests   Final    NONE Performed at [facility], [address] [place], [place]    Culture >=100,000 COLONIES/mL ESCHERICHIA COLI (A)  Final   Report Status 02/21/2020 FINAL  Final   Organism ID, Bacteria ESCHERICHIA COLI (A)  Final      Susceptibility   Escherichia coli - MIC*    AMPICILLIN 8 SENSITIVE Sensitive     CEFAZOLIN <=4 SENSITIVE Sensitive     CEFEPIME <=0.12 SENSITIVE  Sensitive     CEFTRIAXONE <=0.25 SENSITIVE Sensitive     CIPROFLOXACIN >=4 RESISTANT Resistant     GENTAMICIN <=1 SENSITIVE Sensitive     IMIPENEM <=0.25 SENSITIVE Sensitive     NITROFURANTOIN <=16 SENSITIVE Sensitive     TRIMETH/SULFA <=20 SENSITIVE Sensitive     AMPICILLIN/SULBACTAM <=2 SENSITIVE Sensitive     PIP/TAZO <=4 SENSITIVE Sensitive     * >=100,000 COLONIES/mL ESCHERICHIA COLI  Gastrointestinal Panel by PCR , Stool  Status: None   Collection Time: 02/18/20  7:58 PM   Specimen: Stool  Result Value Ref Range Status   Campylobacter species NOT DETECTED NOT DETECTED Final   Plesimonas shigelloides NOT DETECTED NOT DETECTED Final   Salmonella species NOT DETECTED NOT DETECTED Final   Yersinia enterocolitica NOT DETECTED NOT DETECTED Final   Vibrio species NOT DETECTED NOT DETECTED Final   Vibrio cholerae NOT DETECTED NOT DETECTED Final   Enteroaggregative E coli (EAEC) NOT DETECTED NOT DETECTED Final   Enteropathogenic E coli (EPEC) NOT DETECTED NOT DETECTED Final   Enterotoxigenic E coli (ETEC) NOT DETECTED NOT DETECTED Final   Shiga like toxin producing E coli (STEC) NOT DETECTED NOT DETECTED Final   Shigella/Enteroinvasive E coli (EIEC) NOT DETECTED NOT DETECTED Final   Cryptosporidium NOT DETECTED NOT DETECTED Final   Cyclospora cayetanensis NOT DETECTED NOT DETECTED Final   Entamoeba histolytica NOT DETECTED NOT DETECTED Final   Giardia lamblia NOT DETECTED NOT DETECTED Final   Adenovirus F40/41 NOT DETECTED NOT DETECTED Final   Astrovirus NOT DETECTED NOT DETECTED Final   Norovirus GI/GII NOT DETECTED NOT DETECTED Final   Rotavirus A NOT DETECTED NOT DETECTED Final   Sapovirus (I, II, IV, and V) NOT DETECTED NOT DETECTED Final    Comment: Performed at [facility], [address] [place], [place] [address]  Respiratory Panel by RT PCR (Flu A&B, Covid) -     Status: None   Collection Time: 02/18/20  8:51 PM   Specimen: Nasopharyngeal  Result Value Ref  Range Status   SARS Coronavirus 2 by RT PCR NEGATIVE NEGATIVE Final    Comment: (NOTE) SARS-CoV-2 target nucleic acids are NOT DETECTED.  The SARS-CoV-2 RNA is generally detectable in upper respiratoy specimens during the acute phase of infection. The lowest concentration of SARS-CoV-2 viral copies this assay can detect is 131 copies/mL. A negative result does not preclude SARS-Cov-2 infection and should not be used as the sole basis for treatment or other patient management decisions. A negative result may occur with  improper specimen collection/handling, submission of specimen other than nasopharyngeal swab, presence of viral mutation(s) within the areas targeted by this assay, and inadequate number of viral copies (<131 copies/mL). A negative result must be combined with clinical observations, patient history, and epidemiological information. The expected result is Negative.  Fact Sheet for Patients:  https://www.moore.com/  Fact Sheet for Healthcare Providers:  https://www.young.biz/  This test is no t yet approved or cleared by the [place] FDA and  has been authorized for detection and/or diagnosis of SARS-CoV-2 by FDA under an Emergency Use Authorization (EUA). This EUA will remain  in effect (meaning this test can be used) for the duration of the COVID-19 declaration under Section 564(b)(1) of the Act, 21 U.S.C. section 360bbb-3(b)(1), unless the authorization is terminated or revoked sooner.     Influenza A by PCR NEGATIVE NEGATIVE Final   Influenza B by PCR NEGATIVE NEGATIVE Final    Comment: (NOTE) The Xpert Xpress SARS-CoV-2/FLU/RSV assay is intended as an aid in  the diagnosis of influenza from Nasopharyngeal swab specimens and  should not be used as a sole basis for treatment. Nasal washings and  aspirates are unacceptable for Xpert Xpress SARS-CoV-2/FLU/RSV  testing.  Fact Sheet for  Patients: https://www.moore.com/  Fact Sheet for Healthcare Providers: https://www.young.biz/  This test is not yet approved or cleared by the [place] FDA and  has been authorized for detection and/or diagnosis of SARS-CoV-2 by  FDA under an  Emergency Use Authorization (EUA). This EUA will remain  in effect (meaning this test can be used) for the duration of the  Covid-19 declaration under Section 564(b)(1) of the Act, 21  U.S.C. section 360bbb-3(b)(1), unless the authorization is  terminated or revoked. Performed at [facility], [address], [place], [place] [address]      Radiology Studies: DG CHEST PORT 1 VIEW  Result Date: 02/21/2020 CLINICAL DATA:  Cough EXAM: PORTABLE CHEST 1 VIEW COMPARISON:  February 11, 2019 FINDINGS: There is atelectatic change in the left base. The lungs elsewhere are clear. Heart size and pulmonary vascularity are normal. Calcified left hilar lymph node is stable consistent with prior granulomatous disease. There is aortic atherosclerosis. Calcification noted in the proximal right subclavian artery with questionable aneurysmal dilatation in this area measuring 1.4 x 1.2 cm. Bones are osteoporotic. Hiatal hernia is seen previously is not convincingly seen on current examination. IMPRESSION: Left base atelectasis. Lungs elsewhere clear. Evidence of prior granulomatous disease. Heart size normal. 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 Aortic Atherosclerosis (ICD10-I70.0). Electronically Signed   By: Bretta Bang III M.D.   On: 02/21/2020 08:34    Pamella Pert, MD, PhD [facility]  Between 7 am - 7 pm I am available, please contact me via Amion or Securechat  Between 7 pm - 7 am I am not available, please contact night coverage MD/APP via Amion

## 2020-02-21 NOTE — Evaluation (Signed)
Physical Therapy Evaluation Patient Details Name: Lisa Freeman MRN: [id number] DOB: Mar 31, 1919 Today's Date: 02/21/2020   History of Present Illness  86 year old female with history of HTN, came in with abdominal pain, diarrhea that started 3 days after starting oral Levaquin for UTI.  CT on admission showed colitis, and due to elevated WBC she was admitted to the hospital and placed on antibiotics.  Clinical Impression  Pt admitted with above diagnosis.  Pt very pleasant and cooperative, amb with RW at baseline, now with generalized weakness d/t illness and bedrest. Will benefit from SNF post acute to maximize independence and safety.  Pt currently with functional limitations due to the deficits listed below (see PT Problem List). Pt will benefit from skilled PT to increase their independence and safety with mobility to allow discharge to the venue listed below.       Follow Up Recommendations SNF    Equipment Recommendations  None recommended by PT    Recommendations for Other Services       Precautions / Restrictions Precautions Precautions: Fall Restrictions Weight Bearing Restrictions: No      Mobility  Bed Mobility Overal bed mobility: Needs Assistance Bed Mobility: Supine to Sit     Supine to sit: Min assist;Mod assist     General bed mobility comments: assist to fully bring LEs off bed and elevate trunk, incr time    Transfers Overall transfer level: Needs assistance Equipment used: Rolling walker (2 wheeled) Transfers: Sit to/from [employer] Sit to Stand: Min assist;Mod assist Stand pivot transfers: Min assist       General transfer comment: assist rise and stabilize, cues for hand placement and safety, assist to balance during pivot. urinary urgency  Ambulation/Gait Ambulation/Gait assistance: Min assist Gait Distance (Feet): 6 Feet Assistive device: Rolling walker (2 wheeled) Gait Pattern/deviations: Step-to pattern      General Gait Details: cues for RW position, assist for balance, slight knee buckling x1 requiring assist to recover  Stairs            Wheelchair Mobility    Modified Rankin (Stroke Patients Only)       Balance Overall balance assessment: Needs assistance   Sitting balance-Leahy Scale: Fair       Standing balance-Leahy Scale: Poor Standing balance comment: reliant on UEs and external assist                             Pertinent Vitals/Pain Pain Assessment: No/denies pain Pain Score: 6  Pain Location: right side of face/neck Pain Descriptors / Indicators: Sore Pain Intervention(s): Limited activity within patient's tolerance;Monitored during session    Home Living Family/patient expects to be discharged to:: Skilled nursing facility                      Prior Function           Comments: amb with RW at baseline     Hand Dominance        Extremity/Trunk Assessment   Upper Extremity Assessment Upper Extremity Assessment: Defer to OT evaluation    Lower Extremity Assessment Lower Extremity Assessment: Generalized weakness       Communication      Cognition Arousal/Alertness: Awake/alert Behavior During Therapy: WFL for tasks assessed/performed Overall Cognitive Status: Within Functional Limits for tasks assessed  General Comments      Exercises     Assessment/Plan    PT Assessment Patient needs continued PT services  PT Problem List Decreased strength;Decreased mobility;Decreased activity tolerance;Decreased knowledge of use of DME       PT Treatment Interventions DME instruction;Therapeutic exercise;Gait training;Functional mobility training;Therapeutic activities;Patient/family education    PT Goals (Current goals can be found in the Care Plan section)  Acute Rehab PT Goals Patient Stated Goal: to get better PT Goal Formulation: With patient Time For Goal  Achievement: 03/06/20 Potential to Achieve Goals: Good    Frequency Min 2X/week   Barriers to discharge        Co-evaluation               AM-PAC PT "6 Clicks" Mobility  Outcome Measure Help needed turning from your back to your side while in a flat bed without using bedrails?: A Little Help needed moving from lying on your back to sitting on the side of a flat bed without using bedrails?: A Lot Help needed moving to and from a bed to a chair (including a wheelchair)?: A Lot Help needed standing up from a chair using your arms (e.g., wheelchair or bedside chair)?: A Lot Help needed to walk in hospital room?: A Lot Help needed climbing 3-5 steps with a railing? : Total 6 Click Score: 12    End of Session Equipment Utilized During Treatment: Gait belt Activity Tolerance: Patient tolerated treatment well Patient left: in chair;with call bell/phone within reach;with chair alarm set Nurse Communication: Mobility status PT Visit Diagnosis: Difficulty in walking, not elsewhere classified (R26.2);Other abnormalities of gait and mobility (R26.89);Muscle weakness (generalized) (M62.81)    Time: 1100-1118 PT Time Calculation (min) (ACUTE ONLY): 18 min   Charges:   PT Evaluation $PT Eval Low Complexity: 1 Low          Nickia Boesen, PT  Acute Rehab Dept ([facility]) [phone] Pager [phone]  02/21/2020   [facility] 02/21/2020, 1:57 PM

## 2020-02-21 NOTE — NC FL2 (Signed)
[place] MEDICAID FL2 LEVEL OF CARE SCREENING TOOL     IDENTIFICATION  Patient Name: Lisa Freeman Birthdate: 11-Apr-1919 Sex: female Admission Date (Current Location): 02/18/2020  [facility] and [place] Number:  Producer, television/film/video and Address:  [facility],  [address], [place] [address]      Provider Number: [id number]  Attending Physician Name and Address:  Leatha Gilding, MD  Relative Name and Phone Number:  HENNIS,STELLA (Daughter) [phone]    Current Level of Care: Hospital Recommended Level of Care: Skilled Nursing Facility Prior Approval Number:    Date Approved/Denied:   PASRR Number: [id number]  Discharge Plan: SNF    Current Diagnoses: Patient Active Problem List   Diagnosis Date Noted  . Colitis 02/19/2020  . Acute colitis 02/18/2020  . Pneumonia of right middle lobe due to infectious organism   . Acute UTI 01/12/2019  . AKI (acute kidney injury) (HCC) 01/12/2019  . Anxiety 01/12/2019  . Diarrhea 01/12/2019  . Abdominal pain 01/11/2019  . Colitis presumed infectious 12/27/2018  . Anemia   . Chronic anemia   . Insomnia   . Subclinical hyperthyroidism   . Anxiety state, unspecified   . Atrial fibrillation (HCC)   . Carotid stenosis   . Cerebral artery occlusion   . Diverticulosis of colon   . Dyshidrosis   . Other and unspecified hyperlipidemia   . Migraine   . Rosacea   . Unspecified venous (peripheral) insufficiency   . Urinary tract infection, site not specified 11/10/2013  . Urinary tract obstruction due to kidney stone 10/25/2012  . Hypertension     Orientation RESPIRATION BLADDER Height & Weight     Self  Normal External catheter Weight: 44.5 kg Height:  5' (152.4 cm)  BEHAVIORAL SYMPTOMS/MOOD NEUROLOGICAL BOWEL NUTRITION STATUS   (none)  (none) Continent Diet (see d/c summary)  AMBULATORY STATUS COMMUNICATION OF NEEDS Skin   Extensive Assist Verbally Normal                       Personal Care  Assistance Level of Assistance  Bathing, Feeding, Dressing Bathing Assistance: Maximum assistance Feeding assistance: Limited assistance Dressing Assistance: Limited assistance     Functional Limitations Info  Sight, Hearing, Speech Sight Info: Adequate Hearing Info: Impaired Speech Info: Adequate    SPECIAL CARE FACTORS FREQUENCY  PT (By licensed PT)     PT Frequency: 5X/W              Contractures Contractures Info: Not present    Additional Factors Info  Code Status, Allergies Code Status Info: full Allergies Info: Doxycycline, Fluticasone Propionate, Moxifloxacine, Nitrofurantoin Monohyd, Penicillins, Promethazine, Sulf Antibiotics, Trimethoprim, Codeine           Current Medications (02/21/2020):  This is the current hospital active medication list Current Facility-Administered Medications  Medication Dose Route Frequency Provider Last Rate Last Admin  . 0.9 %  sodium chloride infusion   Intravenous PRN Tilden Fossa, MD 10 mL/hr at 02/20/20 0439 New Bag at 02/20/20 0439  . acetaminophen (TYLENOL) tablet 650 mg  650 mg Oral Q6H PRN Delano Metz, MD   650 mg at 02/21/20 0454   Or  . acetaminophen (TYLENOL) suppository 650 mg  650 mg Rectal Q6H PRN Delano Metz, MD      . acetaminophen (TYLENOL) tablet 650 mg  650 mg Oral Q4H PRN Marinda Elk, MD   650 mg at 02/19/20 1517  . ALPRAZolam (XANAX) tablet 0.25-0.5 mg  0.25-0.5  mg Oral BID PRN Delano Metz, MD      . amLODipine (NORVASC) tablet 5 mg  5 mg Oral Daily Delano Metz, MD   5 mg at 02/21/20 0924  . aspirin EC tablet 81 mg  81 mg Oral Daily Delano Metz, MD   81 mg at 02/21/20 0926  . cefTRIAXone (ROCEPHIN) 2 g in sodium chloride 0.9 % 100 mL IVPB  2 g Intravenous Q24H Gherghe, Costin M, MD      . enoxaparin (LOVENOX) injection 30 mg  30 mg Subcutaneous Q24H Delano Metz, MD   30 mg at 02/21/20 0925  . folic acid (FOLVITE) tablet 1 mg  1 mg Oral QPM Delano Metz, MD   1 mg at 02/20/20  2057  . lactated ringers infusion   Intravenous Continuous Delano Metz, MD 75 mL/hr at 02/21/20 0454 New Bag at 02/21/20 0454  . metoprolol succinate (TOPROL-XL) 24 hr tablet 12.5 mg  12.5 mg Oral BID Delano Metz, MD   12.5 mg at 02/21/20 0924  . metroNIDAZOLE (FLAGYL) tablet 500 mg  500 mg Oral Q8H Della Goo, [place]   500 mg at 02/21/20 0454  . olopatadine (PATANOL) 0.1 % ophthalmic solution 1 drop  1 drop Right Eye BID Delano Metz, MD   1 drop at 02/21/20 0927  . ondansetron (ZOFRAN) tablet 4 mg  4 mg Oral Q6H PRN Delano Metz, MD   4 mg at 02/21/20 0949   Or  . ondansetron (ZOFRAN) injection 4 mg  4 mg Intravenous Q6H PRN Delano Metz, MD      . prednisoLONE acetate (PRED FORTE) 1 % ophthalmic suspension 1 drop  1 drop Right Eye QHS Delano Metz, MD   1 drop at 02/20/20 2101  . senna-docusate (Senokot-S) tablet 1 tablet  1 tablet Oral QHS PRN Delano Metz, MD         Discharge Medications: Please see discharge summary for a list of discharge medications.  Relevant Imaging Results:  Relevant Lab Results:   Additional Information [phone] [place], [place]

## 2020-02-21 NOTE — Evaluation (Signed)
Clinical/Bedside Swallow Evaluation Patient Details  Name: Lisa Freeman MRN: [id number] Date of Birth: 1918/06/18  Today's Date: 02/21/2020 Time: SLP Start Time (ACUTE ONLY): 1110 SLP Stop Time (ACUTE ONLY): 1145 SLP Time Calculation (min) (ACUTE ONLY): 35 min  Past Medical History:  Past Medical History:  Diagnosis Date  . Hypertension    Past Surgical History:  Past Surgical History:  Procedure Laterality Date  . ABDOMINAL HYSTERECTOMY    . APPENDECTOMY    . BACK SURGERY    . CYSTOSCOPY W/ RETROGRADES Left 11/06/2012   Procedure: CYSTOSCOPY WITH RETROGRADE PYELOGRAM;  Surgeon: Lindaann Slough, MD;  Location[facility];  Service: Urology;  Laterality: Left;  . CYSTOSCOPY W/ URETERAL STENT REMOVAL Left 11/06/2012   Procedure: CYSTOSCOPY WITH STENT REMOVAL;  Surgeon: Lindaann Slough, MD;  Location: [facility];  Service: Urology;  Laterality: Left;  . CYSTOSCOPY WITH RETROGRADE PYELOGRAM, URETEROSCOPY AND STENT PLACEMENT Left 10/25/2012   Procedure: CYSTOSCOPY/LEFT RETROGRADE PYELOGRAM/PLACEMENT LEFT URETERAL STENT;  Surgeon: Lindaann Slough, MD;  Location: [facility];  Service: Urology;  Laterality: Left;  CYSTOSCOPY/LEFT RETROGRADE PYELOGRAM/URETEROSCOPY/PLACEMENT URETERAL STENT  . CYSTOSCOPY WITH STENT PLACEMENT Left 11/06/2012   Procedure: CYSTOSCOPY WITH STENT PLACEMENT;  Surgeon: Lindaann Slough, MD;  Location: [facility];  Service: Urology;  Laterality: Left;  . CYSTOSCOPY WITH URETEROSCOPY AND STENT PLACEMENT Left 11/06/2012   Procedure: CYSTOSCOPY WITH URETEROSCOPY, STONE MANIPULATION;  Surgeon: Lindaann Slough, MD;  Location: [facility];  Service: Urology;  Laterality: Left;  . HIP FRACTURE SURGERY Left    HPI:  86yo female admitted 02/18/20 with UTI, abdominal pain and diarrhea. PMH: HTN.   Assessment / Plan / Recommendation Clinical Impression  Pt seen at bedside for assessment of swallow function and safety. Pt  has upper and lower dentures, and reports pain on the right side of her face, from above her eye to her neck. Pt reports that she does not like to drink/eat cold items, as they cause abdominal pain. She also indicates that she drinks hot coffee to "clear it out". Pt accepted trials of thin liquid, puree, and solid textures. No oral issues or overt s/s aspiration observed on any item given. RN reports no difficulty with breakfast or medications.  This informaion raises suspicion for primary esophageal dysphagia. Pt was given strategies for management of esophageal dysmotility. If this issue worsens, recommend consideration of an esophageal work up to more thoroughly evaluate esophageal motility. RN informed. ST signing off at this time. Please reconsult if needs arise.   Behavioral and Dietary Strategies for Management of Esophageal Dysmotility 1. Take reflux medications 30+ minutes before other po intake, in the morning 2. Begin meals with warm beverage 3. Eat smaller more frequent meals 4. Eat slowly, taking small bites and sips 5. Alternate solids and liquids 6. Avoid foods/liquids that increase acid production 7. Sit upright during and for 30+ minutes after meals to facilitate esophageal clearing  SLP Visit Diagnosis: Dysphagia, unspecified (R13.10)    Aspiration Risk  Mild aspiration risk    Diet Recommendation Regular;Thin liquid   Liquid Administration via: Cup;Straw Medication Administration: Whole meds with liquid Supervision: Patient able to self feed Compensations: Minimize environmental distractions;Slow rate;Small sips/bites Postural Changes: Seated upright at 90 degrees;Remain upright for at least 30 minutes after po intake    Other  Recommendations Oral Care Recommendations: Oral care BID   Follow up Recommendations None        Swallow Study   General Date of Onset: 02/18/20 HPI:  86yo female admitted 02/18/20 with UTI, abdominal pain and diarrhea. PMH: HTN. Type of  Study: Bedside Swallow Evaluation Previous Swallow Assessment: BSE 01/2019 - reg/thin Diet Prior to this Study: Regular;Thin liquids Temperature Spikes Noted: No Respiratory Status: Room air History of Recent Intubation: No Behavior/Cognition: Alert;Cooperative;Pleasant mood Oral Cavity Assessment: Within Functional Limits Oral Care Completed by SLP: No Oral Cavity - Dentition: Dentures, top;Dentures, bottom Vision: Functional for self-feeding Self-Feeding Abilities: Able to feed self Patient Positioning: Upright in chair Baseline Vocal Quality: Normal Volitional Cough: Strong Volitional Swallow: Able to elicit    Oral/Motor/Sensory Function Overall Oral Motor/Sensory Function: Within functional limits      Thin Liquid Thin Liquid: Within functional limits Presentation: Cup;Straw    Puree Puree: Within functional limits Presentation: Self Fed;Spoon   Solid     Solid: Within functional limits Presentation: Self Fed     Jan Walters B. Murvin Natal, [facility], CCC-SLP Speech Language Pathologist Office: [phone]  Leigh Aurora 02/21/2020,11:53 AM

## 2020-02-21 NOTE — Progress Notes (Signed)
Pt started coughing up secretions after drinking water. Patient pulled up. Bed in high fowlers. Pt able to cough up independently. Pt states she coughs like this "from time to time" concerned for aspiration, notified oncall provider, questioned need for swallow eval.

## 2020-02-21 NOTE — TOC Initial Note (Signed)
Transition of Care [facility]) - Initial/Assessment Note    Patient Details  Name: Lisa Freeman MRN: [id number] Date of Birth: 06/13/1918  [facility]) CM/SW Contact:    Lisa Mage, LCSW Phone Number: 02/21/2020, 12:00 PM  Clinical Narrative:   Met with patient, daughter per RN request.  Lisa Freeman is [facility], gave permission for me to speak with daughter Lisa Freeman. She states her mother has been living with she and her husband for the past 35 years, has gone well until recently with this illness and now her mother is significantly weaker, feels she needs rehab to help her get back to baseline. Feels guilty.  Support given.  Explained process, including recommendations of PT and bed search.  Will plan to initiate bed search once PT note is submitted.  TOC will continue to follow during the course of hospitalization.                 Expected Discharge Plan: Skilled Nursing Facility Barriers to Discharge: SNF Pending bed offer   Patient Goals and CMS Choice     Choice offered to / list presented to : Adult Children  Expected Discharge Plan and Services Expected Discharge Plan: [place]   Discharge Planning Services: CM Consult Post Acute Care Choice: [place] Living arrangements for the past 2 months: Single Family Home                                      Prior Living Arrangements/Services Living arrangements for the past 2 months: Single Family Home Lives with:: Adult Children Patient language and need for interpreter reviewed:: Yes        Need for Family Participation in Patient Care: Yes (Comment) Care giver support system in place?: Yes (comment) Current home services: DME Criminal Activity/Legal Involvement Pertinent to Current Situation/Hospitalization: No - Comment as needed  Activities of Daily Living Home Assistive Devices/Equipment: Dentures (specify type), Eyeglasses, Cane (specify quad or straight), Wheelchair  (single point cane, upper/lower dentures) ADL Screening (condition at time of admission) Patient's cognitive ability adequate to safely complete daily activities?: Yes Is the patient deaf or have difficulty hearing?: Yes (very HOH but can read notes-did have hearing aids but they got lost per patient) Does the patient have difficulty seeing, even when wearing glasses/contacts?: No Does the patient have difficulty concentrating, remembering, or making decisions?: Yes Patient able to express need for assistance with ADLs?: Yes Does the patient have difficulty dressing or bathing?: Yes Independently performs ADLs?: No Communication: Independent Dressing (OT): Needs assistance Is this a change from baseline?: Pre-admission baseline Grooming: Needs assistance Is this a change from baseline?: Pre-admission baseline Feeding: Independent Bathing: Needs assistance Is this a change from baseline?: Pre-admission baseline Toileting: Needs assistance Is this a change from baseline?: Pre-admission baseline In/Out Bed: Needs assistance Is this a change from baseline?: Pre-admission baseline Walks in Home: Needs assistance Is this a change from baseline?: Pre-admission baseline Does the patient have difficulty walking or climbing stairs?: Yes (secondary to weakness-does not do stairs) Weakness of Legs: Both Weakness of Arms/Hands: None  Permission Sought/Granted Permission sought to share information with : Family Supports Permission granted to share information with : Yes, Verbal Permission Granted  Share Information with NAMEWilmer Freeman (Daughter) [phone]           Emotional Assessment Appearance:: Appears stated age Attitude/Demeanor/Rapport: Engaged Affect (typically observed): Appropriate Orientation: :  Oriented to Self Alcohol / Substance Use: Not Applicable Psych Involvement: No (comment)  Admission diagnosis:  Colitis [K52.9] Generalized abdominal pain [R10.84] Acute UTI  [N39.0] Acute colitis [K52.9] Patient Active Problem List   Diagnosis Date Noted  . Colitis 02/19/2020  . Acute colitis 02/18/2020  . Pneumonia of right middle lobe due to infectious organism   . Acute UTI 01/12/2019  . AKI (acute kidney injury) ([place]) 01/12/2019  . Anxiety 01/12/2019  . Diarrhea 01/12/2019  . Abdominal pain 01/11/2019  . Colitis presumed infectious 12/27/2018  . Anemia   . Chronic anemia   . Insomnia   . Subclinical hyperthyroidism   . Anxiety state, unspecified   . Atrial fibrillation ([place])   . Carotid stenosis   . Cerebral artery occlusion   . Diverticulosis of colon   . Dyshidrosis   . Other and unspecified hyperlipidemia   . Migraine   . Rosacea   . Unspecified venous (peripheral) insufficiency   . Urinary tract infection, site not specified 11/10/2013  . Urinary tract obstruction due to kidney stone 10/25/2012  . Hypertension    PCP:  Lisa Bellows, PA-C Pharmacy:   [id number] [address], [address] AT [facility] [address] [place] [address] Phone: [phone] Fax: [phone]  [id number] [id number] [id number]: [phone]: [phone] [id number] [address] [id number]: [phone]: [phone]   Social Determinants of Health (SDOH) Interventions    Readmission Risk Interventions No flowsheet data found.

## 2020-02-22 LAB — BASIC METABOLIC PANEL
Anion gap: 9 (ref 5–15)
BUN: 11 mg/dL (ref 8–23)
CO2: 24 mmol/L (ref 22–32)
Calcium: 8.6 mg/dL — ABNORMAL LOW (ref 8.9–10.3)
Chloride: 105 mmol/L (ref 98–111)
Creatinine, Ser: 0.88 mg/dL (ref 0.44–1.00)
GFR, Estimated: 59 mL/min — ABNORMAL LOW (ref >60)
Glucose, Bld: 93 mg/dL (ref 70–99)
Potassium: 3.9 mmol/L (ref 3.5–5.1)
Sodium: 138 mmol/L (ref 135–145)

## 2020-02-22 LAB — CBC
HCT: 29.1 % — ABNORMAL LOW (ref 36.0–46.0)
Hemoglobin: 9 g/dL — ABNORMAL LOW (ref 12.0–15.0)
MCH: 30.1 pg (ref 26.0–34.0)
MCHC: 30.9 g/dL (ref 30.0–36.0)
MCV: 97.3 fL (ref 80.0–100.0)
Platelets: 213 10*3/uL (ref 150–400)
RBC: 2.99 MIL/uL — ABNORMAL LOW (ref 3.87–5.11)
RDW: 13.5 % (ref 11.5–15.5)
WBC: 6.2 10*3/uL (ref 4.0–10.5)
nRBC: 0 % (ref 0.0–0.2)

## 2020-02-22 NOTE — Progress Notes (Signed)
PROGRESS NOTE  Lisa Freeman ZOX:[id number] DOB: June 09, 1918 DOA: 02/18/2020 PCP: Bailey MechPodraza, Cole Christopher, PA-C   LOS: 3 days   Brief Narrative / Interim history: 86 year old female with history of HTN, came in with abdominal pain, diarrhea that started 3 days after starting oral Levaquin for UTI.  CT on admission showed colitis, and due to elevated WBC she was admitted to the hospital and placed on antibiotics.  Subjective / 24h Interval events: Feeling better this morning, denies any chest pain, denies any shortness of breath  Assessment & Plan: Principal Problem Acute colitis -Unclear etiology, possibly a degree of bacterial imbalance due to antibiotic usage.  Her diarrhea has resolved and we are unable to rule out C. difficile, however GI pathogen panel has been run and it was negative.  I am not sure why C. difficile was not run, there is no documentation in the chart.  She has been empirically placed on ceftriaxone along with the metronidazole, she is improving and continue that for now -Continues to tolerate the diet  Active Problems UTI -Urine cultures speciated E. coli which unfortunately is resistant to fluoroquinolones.  Levaquin is about the only oral medication that she tolerated in the past.  She seems to be tolerating IV ceftriaxone, continue  Essential hypertension -Continue Norvasc, metoprolol  Multiple allergies -Daughter tells me that the only true allergy is with penicillin, has shortness of breath, mostly other allergies listed are related to GI upset  Deconditioning -Patient weaker than baseline due to colitis and UTI, needs SNF, TOC consulted, bed placement pending   Scheduled Meds: . amLODipine  5 mg Oral Daily  . aspirin EC  81 mg Oral Daily  . enoxaparin (LOVENOX) injection  30 mg Subcutaneous Q24H  . folic acid  1 mg Oral QPM  . metoprolol succinate  12.5 mg Oral BID  . metroNIDAZOLE  500 mg Oral Q8H  . olopatadine  1 drop Right Eye BID  .  prednisoLONE acetate  1 drop Right Eye QHS   Continuous Infusions: . sodium chloride 10 mL/hr at 02/20/20 0439  . cefTRIAXone (ROCEPHIN)  IV 2 g (02/21/20 1702)  . lactated ringers 75 mL/hr at 02/21/20 1700   PRN Meds:.sodium chloride, acetaminophen **OR** acetaminophen, acetaminophen, ALPRAZolam, ondansetron **OR** ondansetron (ZOFRAN) IV, senna-docusate  Diet Orders (From admission, onward)    Start     Ordered   02/20/20 0843  Diet regular Room service appropriate? Yes; Fluid consistency: Thin  Diet effective now       Question Answer Comment  Room service appropriate? Yes   Fluid consistency: Thin      02/20/20 0842          DVT prophylaxis: enoxaparin (LOVENOX) injection 30 mg Start: 02/19/20 1000     Code Status: Full Code  Family Communication: daughter at bedside   Status is: Inpatient  Remains inpatient appropriate because:Inpatient level of care appropriate due to severity of illness   Dispo: The patient is from: Home              Anticipated d/c is to: Home              Anticipated d/c date is: 1 day              Patient currently is not medically stable to d/c.  Consultants:  none  Procedures:  None   Microbiology  Urine cultures  Antimicrobials: Ceftriaxone / metronidazole     Objective: Vitals:   02/21/20 1305 02/21/20 2014 02/22/20 0015 02/22/20  0430  BP: (!) 160/51 (!) 159/61 (!) 172/82 (!) 154/59  Pulse: 60 (!) 57 73 (!) 59  Resp: 18 18  18   Temp: 98.1 F (36.7 C) 98 F (36.7 C)  97.6 F (36.4 C)  TempSrc: Oral Oral    SpO2: 98% 97%  97%  Weight:      Height:        Intake/Output Summary (Last 24 hours) at 02/22/2020 1324 Last data filed at 02/22/2020 0434 Gross per 24 hour  Intake 1127.57 ml  Output 2050 ml  Net -922.43 ml   Filed Weights   02/18/20 1441 02/20/20 0500 02/21/20 0500  Weight: 44.5 kg 46.7 kg 44.5 kg    Examination:  Constitutional: NAD Eyes: No icterus ENMT: Moist mucous membranes Neck: normal,  supple Respiratory: Lungs are clear bilaterally, no wheezing, no crackles Cardiovascular: Regular rate and rhythm, no murmurs, no edema Abdomen: Soft, nontender, nondistended, no guarding or rebound Musculoskeletal: no clubbing / cyanosis.  Skin: No rashes Neurologic: Nonfocal   Data Reviewed: I have independently reviewed following labs and imaging studies   CBC: Recent Labs  Lab 02/18/20 1614 02/19/20 0708 02/21/20 0358 02/22/20 0508  WBC 12.8* 9.8 8.5 6.2  NEUTROABS 9.6*  --   --   --   HGB 10.6* 10.2* 9.4* 9.0*  HCT 33.1* 32.4* 30.5* 29.1*  MCV 94.8 96.1 99.3 97.3  PLT 247 228 210 213   Basic Metabolic Panel: Recent Labs  Lab 02/18/20 1614 02/19/20 0708 02/21/20 0358 02/22/20 0508  NA 137 141 138 138  K 4.1 3.9 4.0 3.9  CL 105 109 107 105  CO2 23 23 22 24   GLUCOSE 102* 89 94 93  BUN 21 15 11 11   CREATININE 1.16* 0.84 0.81 0.88  CALCIUM 9.7 8.9 8.8* 8.6*   Liver Function Tests: Recent Labs  Lab 02/18/20 1614  AST 17  ALT 16  ALKPHOS 75  BILITOT 0.4  PROT 6.7  ALBUMIN 3.4*   Coagulation Profile: No results for input(s): INR, PROTIME in the last 168 hours. HbA1C: No results for input(s): HGBA1C in the last 72 hours. CBG: No results for input(s): GLUCAP in the last 168 hours.  Recent Results (from the past 240 hour(s))  Urine culture     Status: Abnormal   Collection Time: 02/18/20  5:25 PM   Specimen: Urine, Random  Result Value Ref Range Status   Specimen Description   Final    URINE, RANDOM Performed at [facility], [address] [place], [facility] [address]    Special Requests   Final    NONE Performed at [facility], [address] [place], [facility] [place]    Culture >=100,000 COLONIES/mL ESCHERICHIA COLI (A)  Final   Report Status 02/21/2020 FINAL  Final   Organism ID, Bacteria ESCHERICHIA COLI (A)  Final      Susceptibility   Escherichia coli - MIC*    AMPICILLIN 8 SENSITIVE Sensitive     CEFAZOLIN <=4  SENSITIVE Sensitive     CEFEPIME <=0.12 SENSITIVE Sensitive     CEFTRIAXONE <=0.25 SENSITIVE Sensitive     CIPROFLOXACIN >=4 RESISTANT Resistant     GENTAMICIN <=1 SENSITIVE Sensitive     IMIPENEM <=0.25 SENSITIVE Sensitive     NITROFURANTOIN <=16 SENSITIVE Sensitive     TRIMETH/SULFA <=20 SENSITIVE Sensitive     AMPICILLIN/SULBACTAM <=2 SENSITIVE Sensitive     PIP/TAZO <=4 SENSITIVE Sensitive     * >=100,000 COLONIES/mL ESCHERICHIA COLI  Gastrointestinal Panel by PCR , Stool     Status: None   Collection Time: 02/18/20  7:58 PM   Specimen: Stool  Result Value Ref Range Status   Campylobacter species NOT DETECTED NOT DETECTED Final   Plesimonas shigelloides NOT DETECTED NOT DETECTED Final   Salmonella species NOT DETECTED NOT DETECTED Final   Yersinia enterocolitica NOT DETECTED NOT DETECTED Final   Vibrio species NOT DETECTED NOT DETECTED Final   Vibrio cholerae NOT DETECTED NOT DETECTED Final   Enteroaggregative E coli (EAEC) NOT DETECTED NOT DETECTED Final   Enteropathogenic E coli (EPEC) NOT DETECTED NOT DETECTED Final   Enterotoxigenic E coli (ETEC) NOT DETECTED NOT DETECTED Final   Shiga like toxin producing E coli (STEC) NOT DETECTED NOT DETECTED Final   Shigella/Enteroinvasive E coli (EIEC) NOT DETECTED NOT DETECTED Final   Cryptosporidium NOT DETECTED NOT DETECTED Final   Cyclospora cayetanensis NOT DETECTED NOT DETECTED Final   Entamoeba histolytica NOT DETECTED NOT DETECTED Final   Giardia lamblia NOT DETECTED NOT DETECTED Final   Adenovirus F40/41 NOT DETECTED NOT DETECTED Final   Astrovirus NOT DETECTED NOT DETECTED Final   Norovirus GI/GII NOT DETECTED NOT DETECTED Final   Rotavirus A NOT DETECTED NOT DETECTED Final   Sapovirus (I, II, IV, and V) NOT DETECTED NOT DETECTED Final    Comment: Performed at [facility], [address] [place], [place] [address]  Respiratory Panel by RT PCR (Flu A&B, Covid) -     Status: None   Collection Time: 02/18/20  8:51  PM   Specimen: Nasopharyngeal  Result Value Ref Range Status   SARS Coronavirus 2 by RT PCR NEGATIVE NEGATIVE Final    Comment: (NOTE) SARS-CoV-2 target nucleic acids are NOT DETECTED.  The SARS-CoV-2 RNA is generally detectable in upper respiratoy specimens during the acute phase of infection. The lowest concentration of SARS-CoV-2 viral copies this assay can detect is 131 copies/mL. A negative result does not preclude SARS-Cov-2 infection and should not be used as the sole basis for treatment or other patient management decisions. A negative result may occur with  improper specimen collection/handling, submission of specimen other than nasopharyngeal swab, presence of viral mutation(s) within the areas targeted by this assay, and inadequate number of viral copies (<131 copies/mL). A negative result must be combined with clinical observations, patient history, and epidemiological information. The expected result is Negative.  Fact Sheet for Patients:  https://www.moore.com/  Fact Sheet for Healthcare Providers:  https://www.young.biz/  This test is no t yet approved or cleared by the [place] FDA and  has been authorized for detection and/or diagnosis of SARS-CoV-2 by FDA under an Emergency Use Authorization (EUA). This EUA will remain  in effect (meaning this test can be used) for the duration of the COVID-19 declaration under Section 564(b)(1) of the Act, 21 U.S.C. section 360bbb-3(b)(1), unless the authorization is terminated or revoked sooner.     Influenza A by PCR NEGATIVE NEGATIVE Final   Influenza B by PCR NEGATIVE NEGATIVE Final    Comment: (NOTE) The Xpert Xpress SARS-CoV-2/FLU/RSV assay is intended as an aid in  the diagnosis of influenza from Nasopharyngeal swab specimens and  should not be used as a sole basis for treatment. Nasal washings and  aspirates are unacceptable for Xpert Xpress SARS-CoV-2/FLU/RSV   testing.  Fact Sheet for Patients: https://www.moore.com/  Fact Sheet for Healthcare Providers: https://www.young.biz/  This test is not yet approved or cleared by the [place] and  has been authorized for  detection and/or diagnosis of SARS-CoV-2 by  FDA under an Emergency Use Authorization (EUA). This EUA will remain  in effect (meaning this test can be used) for the duration of the  Covid-19 declaration under Section 564(b)(1) of the Act, 21  U.S.C. section 360bbb-3(b)(1), unless the authorization is  terminated or revoked. Performed at [facility], [address], [place], [place] [address]      Radiology Studies: No results found.  Pamella Pert, MD, PhD [facility]  Between 7 am - 7 pm I am available, please contact me via Amion or Securechat  Between 7 pm - 7 am I am not available, please contact night coverage MD/APP via Amion

## 2020-02-22 NOTE — TOC Progression Note (Signed)
Transition of Care [facility]) - Progression Note    Patient Details  Name: Lisa Freeman MRN: [id number] Date of Birth: 1918/07/06  [facility] CM/SW Contact  Clearance Coots, LCSW Phone Number: 02/22/2020, 2:37 PM  Clinical Narrative:    CSW discussed SNF options and bed offers with the patient daughter Lisa Freeman. Patient has 3 offer and the facilities are not accepting weekend admissions. CSW confirm daughter choice for Lisa Freeman anticipating the patient will discharge on Monday. Physician aware, and will order a covid test Sunday.  Facility Administrator Lisa Freeman will reach out to the patient daughter today answer additional question about the patient care at the facility.   Expected Discharge Plan: Skilled Nursing Facility Barriers to Discharge: SNF Pending bed offer  Expected Discharge Plan and Services Expected Discharge Plan: Skilled Nursing Facility   Discharge Planning Services: CM Consult Post Acute Care Choice: Skilled Nursing Facility Living arrangements for the past 2 months: Single Family Home                                       Social Determinants of Health (SDOH) Interventions    Readmission Risk Interventions No flowsheet data found.

## 2020-02-23 LAB — RESPIRATORY PANEL BY RT PCR (FLU A&B, COVID)
Influenza A by PCR: NEGATIVE
Influenza B by PCR: NEGATIVE
SARS Coronavirus 2 by RT PCR: NEGATIVE

## 2020-02-23 MED ORDER — PHENOL 1.4 % MT LIQD
1.0000 | OROMUCOSAL | Status: DC | PRN
  Filled 2020-02-23: qty 177

## 2020-02-23 NOTE — Progress Notes (Signed)
PROGRESS NOTE  Lisa Freeman NTZ:[id number] DOB: 1918/06/19 DOA: 02/18/2020 PCP: Bailey Mech, PA-C   LOS: 4 days   Brief Narrative / Interim history: 86 year old female with history of HTN, came in with abdominal pain, diarrhea that started 3 days after starting oral Levaquin for UTI.  CT on admission showed colitis, and due to elevated WBC she was admitted to the hospital and placed on antibiotics.  Subjective / 24h Interval events: Complains of a dry mouth.  No nausea, no vomiting.  Eating well  Assessment & Plan: Principal Problem Acute colitis-Unclear etiology, possibly a degree of bacterial imbalance due to antibiotic usage.  GI pathogen panel was negative, C. difficile was not run for unknown reasons to me.  She has been placed empirically on ceftriaxone and metronidazole with improvement in her clinical symptoms.  Her diet was advanced and she is now tolerating a regular diet.  We will continue ceftriaxone up until discharge and prolonged metronidazole for presumed colitis.  Active Problems UTI -Urine cultures speciated E. coli which unfortunately is resistant to fluoroquinolones.  Levaquin is about the only oral medication that she tolerated in the past.  She seems to be tolerating IV ceftriaxone, continue, plan to complete her treatment upon discharge tomorrow Essential hypertension -Continue home regimen Multiple allergies -Daughter tells me that the only true allergy is with penicillin, has shortness of breath, mostly other allergies listed are related to GI upset Deconditioning-Patient weaker than baseline due to colitis and UTI, needs SNF, TOC consulted, bed placement pending   Scheduled Meds: . amLODipine  5 mg Oral Daily  . aspirin EC  81 mg Oral Daily  . enoxaparin (LOVENOX) injection  30 mg Subcutaneous Q24H  . folic acid  1 mg Oral QPM  . metoprolol succinate  12.5 mg Oral BID  . metroNIDAZOLE  500 mg Oral Q8H  . olopatadine  1 drop Right Eye BID  .  prednisoLONE acetate  1 drop Right Eye QHS   Continuous Infusions: . sodium chloride 10 mL/hr at 02/20/20 0439  . cefTRIAXone (ROCEPHIN)  IV 2 g (02/22/20 1723)  . lactated ringers 75 mL/hr at 02/22/20 1722   PRN Meds:.sodium chloride, acetaminophen **OR** acetaminophen, acetaminophen, ALPRAZolam, ondansetron **OR** ondansetron (ZOFRAN) IV, phenol, senna-docusate  Diet Orders (From admission, onward)    Start     Ordered   02/20/20 0843  Diet regular Room service appropriate? Yes; Fluid consistency: Thin  Diet effective now       Question Answer Comment  Room service appropriate? Yes   Fluid consistency: Thin      02/20/20 0842          DVT prophylaxis: enoxaparin (LOVENOX) injection 30 mg Start: 02/19/20 1000     Code Status: Full Code  Family Communication: No family at bedside  Status is: Inpatient  Remains inpatient appropriate because:Inpatient level of care appropriate due to severity of illness   Dispo: The patient is from: Home              Anticipated d/c is to: Home              Anticipated d/c date is: 1 day              Patient currently is not medically stable to d/c.  Consultants:  none  Procedures:  None   Microbiology  Urine cultures  Antimicrobials: Ceftriaxone / metronidazole     Objective: Vitals:   02/22/20 1403 02/22/20 2015 02/23/20 0456 02/23/20 0502  BP: (!) 149/71 Marland Kitchen)  132/55  (!) 175/66  Pulse: 66 60  61  Resp: 16 16  16   Temp: 97.9 F (36.6 C) 98.7 F (37.1 C)  98.2 F (36.8 C)  TempSrc: Oral Oral  Oral  SpO2: 98% 95%  97%  Weight:   45.3 kg   Height:        Intake/Output Summary (Last 24 hours) at 02/23/2020 1044 Last data filed at 02/23/2020 0500 Gross per 24 hour  Intake 708 ml  Output 500 ml  Net 208 ml   Filed Weights   02/20/20 0500 02/21/20 0500 02/23/20 0456  Weight: 46.7 kg 44.5 kg 45.3 kg    Examination:  Constitutional: No distress Eyes: No scleral icterus ENMT: Moist mucous membranes Neck:  normal, supple Respiratory: Clear, no crackles Cardiovascular: Heart is regular, no peripheral edema Abdomen: Soft, nontender, nondistended, no guarding or rebound Musculoskeletal: no clubbing / cyanosis.  Skin: No rashes seen Neurologic: No focal deficits   Data Reviewed: I have independently reviewed following labs and imaging studies   CBC: Recent Labs  Lab 02/18/20 1614 02/19/20 0708 02/21/20 0358 02/22/20 0508  WBC 12.8* 9.8 8.5 6.2  NEUTROABS 9.6*  --   --   --   HGB 10.6* 10.2* 9.4* 9.0*  HCT 33.1* 32.4* 30.5* 29.1*  MCV 94.8 96.1 99.3 97.3  PLT 247 228 210 213   Basic Metabolic Panel: Recent Labs  Lab 02/18/20 1614 02/19/20 0708 02/21/20 0358 02/22/20 0508  NA 137 141 138 138  K 4.1 3.9 4.0 3.9  CL 105 109 107 105  CO2 23 23 22 24   GLUCOSE 102* 89 94 93  BUN 21 15 11 11   CREATININE 1.16* 0.84 0.81 0.88  CALCIUM 9.7 8.9 8.8* 8.6*   Liver Function Tests: Recent Labs  Lab 02/18/20 1614  AST 17  ALT 16  ALKPHOS 75  BILITOT 0.4  PROT 6.7  ALBUMIN 3.4*   Coagulation Profile: No results for input(s): INR, PROTIME in the last 168 hours. HbA1C: No results for input(s): HGBA1C in the last 72 hours. CBG: No results for input(s): GLUCAP in the last 168 hours.  Recent Results (from the past 240 hour(s))  Urine culture     Status: Abnormal   Collection Time: 02/18/20  5:25 PM   Specimen: Urine, Random  Result Value Ref Range Status   Specimen Description   Final    URINE, RANDOM Performed at [facility], [address] [place], [facility] [address]    Special Requests   Final    NONE Performed at [facility], [address] [place], [facility] [place]    Culture >=100,000 COLONIES/mL ESCHERICHIA COLI (A)  Final   Report Status 02/21/2020 FINAL  Final   Organism ID, Bacteria ESCHERICHIA COLI (A)  Final      Susceptibility   Escherichia coli - MIC*    AMPICILLIN 8 SENSITIVE Sensitive     CEFAZOLIN <=4 SENSITIVE Sensitive      CEFEPIME <=0.12 SENSITIVE Sensitive     CEFTRIAXONE <=0.25 SENSITIVE Sensitive     CIPROFLOXACIN >=4 RESISTANT Resistant     GENTAMICIN <=1 SENSITIVE Sensitive     IMIPENEM <=0.25 SENSITIVE Sensitive     NITROFURANTOIN <=16 SENSITIVE Sensitive     TRIMETH/SULFA <=20 SENSITIVE Sensitive     AMPICILLIN/SULBACTAM <=2 SENSITIVE Sensitive     PIP/TAZO <=4 SENSITIVE Sensitive     * >=100,000 COLONIES/mL ESCHERICHIA COLI  Gastrointestinal Panel by PCR , Stool  Status: None   Collection Time: 02/18/20  7:58 PM   Specimen: Stool  Result Value Ref Range Status   Campylobacter species NOT DETECTED NOT DETECTED Final   Plesimonas shigelloides NOT DETECTED NOT DETECTED Final   Salmonella species NOT DETECTED NOT DETECTED Final   Yersinia enterocolitica NOT DETECTED NOT DETECTED Final   Vibrio species NOT DETECTED NOT DETECTED Final   Vibrio cholerae NOT DETECTED NOT DETECTED Final   Enteroaggregative E coli (EAEC) NOT DETECTED NOT DETECTED Final   Enteropathogenic E coli (EPEC) NOT DETECTED NOT DETECTED Final   Enterotoxigenic E coli (ETEC) NOT DETECTED NOT DETECTED Final   Shiga like toxin producing E coli (STEC) NOT DETECTED NOT DETECTED Final   Shigella/Enteroinvasive E coli (EIEC) NOT DETECTED NOT DETECTED Final   Cryptosporidium NOT DETECTED NOT DETECTED Final   Cyclospora cayetanensis NOT DETECTED NOT DETECTED Final   Entamoeba histolytica NOT DETECTED NOT DETECTED Final   Giardia lamblia NOT DETECTED NOT DETECTED Final   Adenovirus F40/41 NOT DETECTED NOT DETECTED Final   Astrovirus NOT DETECTED NOT DETECTED Final   Norovirus GI/GII NOT DETECTED NOT DETECTED Final   Rotavirus A NOT DETECTED NOT DETECTED Final   Sapovirus (I, II, IV, and V) NOT DETECTED NOT DETECTED Final    Comment: Performed at [facility], [address] [place], [place] [address]  Respiratory Panel by RT PCR (Flu A&B, Covid) -     Status: None   Collection Time: 02/18/20  8:51 PM   Specimen:  Nasopharyngeal  Result Value Ref Range Status   SARS Coronavirus 2 by RT PCR NEGATIVE NEGATIVE Final    Comment: (NOTE) SARS-CoV-2 target nucleic acids are NOT DETECTED.  The SARS-CoV-2 RNA is generally detectable in upper respiratoy specimens during the acute phase of infection. The lowest concentration of SARS-CoV-2 viral copies this assay can detect is 131 copies/mL. A negative result does not preclude SARS-Cov-2 infection and should not be used as the sole basis for treatment or other patient management decisions. A negative result may occur with  improper specimen collection/handling, submission of specimen other than nasopharyngeal swab, presence of viral mutation(s) within the areas targeted by this assay, and inadequate number of viral copies (<131 copies/mL). A negative result must be combined with clinical observations, patient history, and epidemiological information. The expected result is Negative.  Fact Sheet for Patients:  https://www.moore.com/https://www.fda.gov/media/142436/download  Fact Sheet for Healthcare Providers:  https://www.young.biz/https://www.fda.gov/media/142435/download  This test is no t yet approved or cleared by the [place] FDA and  has been authorized for detection and/or diagnosis of SARS-CoV-2 by FDA under an Emergency Use Authorization (EUA). This EUA will remain  in effect (meaning this test can be used) for the duration of the COVID-19 declaration under Section 564(b)(1) of the Act, 21 U.S.C. section 360bbb-3(b)(1), unless the authorization is terminated or revoked sooner.     Influenza A by PCR NEGATIVE NEGATIVE Final   Influenza B by PCR NEGATIVE NEGATIVE Final    Comment: (NOTE) The Xpert Xpress SARS-CoV-2/FLU/RSV assay is intended as an aid in  the diagnosis of influenza from Nasopharyngeal swab specimens and  should not be used as a sole basis for treatment. Nasal washings and  aspirates are unacceptable for Xpert Xpress SARS-CoV-2/FLU/RSV  testing.  Fact Sheet for  Patients: https://www.moore.com/https://www.fda.gov/media/142436/download  Fact Sheet for Healthcare Providers: https://www.young.biz/https://www.fda.gov/media/142435/download  This test is not yet approved or cleared by the [place] FDA and  has been authorized for detection and/or diagnosis of SARS-CoV-2 by  FDA under an  Emergency Use Authorization (EUA). This EUA will remain  in effect (meaning this test can be used) for the duration of the  Covid-19 declaration under Section 564(b)(1) of the Act, 21  U.S.C. section 360bbb-3(b)(1), unless the authorization is  terminated or revoked. Performed at [facility], [address], [place], [place] [address]      Radiology Studies: No results found.  Pamella Pert, MD, PhD [facility]  Between 7 am - 7 pm I am available, please contact me via Amion or Securechat  Between 7 pm - 7 am I am not available, please contact night coverage MD/APP via Amion

## 2020-02-24 MED ORDER — METRONIDAZOLE 500 MG PO TABS: 500.0000 mg | ORAL_TABLET | Freq: Three times a day (TID) | ORAL | 0 refills | Status: AC

## 2020-02-24 MED ORDER — ALPRAZOLAM 0.5 MG PO TABS
0.2500 mg | ORAL_TABLET | Freq: Two times a day (BID) | ORAL | 0 refills | Status: DC | PRN
Start: 2020-02-24 — End: 2020-12-11

## 2020-02-24 NOTE — TOC Transition Note (Signed)
Transition of Care [facility]) - CM/SW Discharge Note   Patient Details  Name: Lisa Freeman MRN: [id number] Date of Birth: 07-Apr-1919  Transition of Care [facility]) CM/SW Contact:  Ida Rogue, LCSW Phone Number: 02/24/2020, 10:22 AM   Clinical Narrative:  Patient to transfer to [facility] today.  Family informed. PTAR arranged. Nursing, please call report to [phone], room 706P. TOC sign off.     Final next level of care: Skilled Nursing Facility Barriers to Discharge: Barriers Resolved   Patient Goals and CMS Choice     Choice offered to / list presented to : Adult Children  Discharge Placement                       Discharge Plan and Services   Discharge Planning Services: CM Consult Post Acute Care Choice: Skilled Nursing Facility                               Social Determinants of Health (SDOH) Interventions     Readmission Risk Interventions No flowsheet data found.

## 2020-02-24 NOTE — Discharge Summary (Signed)
Physician Discharge Summary  Lisa Freeman OMV:[id number] DOB: [birth year] DOA: 02/18/2020  PCP: Bailey Mech, PA-C  Admit date: 02/18/2020 Discharge date: 02/24/2020  Admitted From: home Disposition:  SNF  Recommendations for Outpatient Follow-up:  1. Follow up with PCP in 1-2 weeks 2. Complete metronidazole for 2 additional days  Home Health: None Equipment/Devices: None  Discharge Condition: Stable CODE STATUS: DNR Diet recommendation: Regular  HPI: Per admitting MD, The patient is a 86 y.o. year-old w/ hx of HTN presented to ED at [facility] for abd pain and diarrhea that started 2 days after starting abx po (levaquin) for a UTI.  In ED BP 150 83, HR 70's, RR 18- 20, temp 98. RA 99%. CT abdomen showed "colitis" in the large intestine from the splenic flexure to the sigmoid colon. WBC in ED was up. Given comorbidities pt was felt to require admission. Asked to see for admission. Pt seen in room.  States she is "feeling a lot better", abd pain is better and overall better since coming to ED.  As per ED note, the dtr had told them that last week on Thursday pt had UTI symptoms and was started on Levaquin. She took 3 doses then developed worsening abd pain w/ profuse diarrhea for the last 48hrs. Pt does not give much history tonight, she is sleepy and not a great historian.  Pt denies any chest pain, SOB, fevers, chills , sweats, n/v or headaches.   Hospital Course / Discharge diagnoses: Principal Problem Acute colitis-Unclear etiology, possibly a degree of bacterial imbalance due to antibiotic usage.  GI pathogen panel was negative, C. difficile was not run for unknown reasons to me.  She has been placed empirically on ceftriaxone and metronidazole with improvement in her clinical symptoms.  Her diet was advanced and she is now tolerating a regular diet.  Please continue metronidazole for 2 additional days but okay to hold if patient is not tolerating.  Clinically she has  improved.  Active Problems UTI -Urine cultures speciated E. coli which unfortunately is resistant to fluoroquinolones.  Levaquin is about the only oral medication that she tolerated in the past and she has been on that for this UTI in the 3 days prior to this admission.  She seems to be tolerating IV ceftriaxone, continue, completed 6 days of intravenous antibiotics while hospitalized Essential hypertension -Continue home regimen Multiple allergies -Daughter tells me that the only true allergy is with penicillin, has shortness of breath, mostly other allergies listed are related to GI upset Deconditioning-to be discharged to SNF rehab  Discharge Instructions   Allergies as of 02/24/2020      Reactions   Doxycycline Shortness Of Breath   Fluticasone Propionate Shortness Of Breath   Moxifloxacin Shortness Of Breath   Nitrofurantoin Monohyd Macro Shortness Of Breath   Penicillins Shortness Of Breath   Tolerates cephalexin Did it involve swelling of the face/tongue/throat, SOB, or low BP? Unknown Did it involve sudden or severe rash/hives, skin peeling, or any reaction on the inside of your mouth or nose? Unknown Did you need to seek medical attention at a hospital or doctor's office? Unknown When did it last happen? If all above answers are "NO", may proceed with cephalosporin use.   Promethazine Shortness Of Breath   Sulfa Antibiotics Shortness Of Breath   Trimethoprim Shortness Of Breath   Codeine Nausea And Vomiting      Medication List    TAKE these medications   acetaminophen 500 MG tablet Commonly known as:  TYLENOL Take 1,000 mg by mouth every 6 (six) hours as needed for moderate pain.   ALPRAZolam 0.5 MG tablet Commonly known as: XANAX Take 0.5-1 tablets (0.25-0.5 mg total) by mouth 2 (two) times daily as needed for anxiety.   amLODipine 5 MG tablet Commonly known as: NORVASC Take 5 mg by mouth daily.   aspirin EC 81 MG tablet Take 81 mg by mouth daily.     folic acid 1 MG tablet Commonly known as: FOLVITE Take 1 mg by mouth every evening.   metoprolol succinate 25 MG 24 hr tablet Commonly known as: TOPROL-XL Take 12.5 mg by mouth 2 (two) times daily.   metroNIDAZOLE 500 MG tablet Commonly known as: FLAGYL Take 1 tablet (500 mg total) by mouth 3 (three) times daily for 2 days.   Pataday 0.2 % Soln Generic drug: Olopatadine HCl Place 1 drop into the right eye daily.   prednisoLONE acetate 1 % ophthalmic suspension Commonly known as: PRED FORTE Place 1 drop into the right eye at bedtime.   sodium chloride 2 % ophthalmic solution Commonly known as: MURO 128 Place 1 drop into both eyes every 4 (four) hours as needed for eye irritation.            Durable Medical Equipment  (From admission, onward)         Start     Ordered   02/19/20 1423  For home use only DME Walker  Once       Question:  Patient needs a walker to treat with the following condition  Answer:  Physical deconditioning   02/19/20 1422          Contact information for after-discharge care    Destination    [facility] .   Service: Skilled Nursing Contact information: [address] [place] [address] [phone]                  Consultations:  None   Procedures/Studies:  CT Abdomen Pelvis W Contrast  Result Date: 02/18/2020 CLINICAL DATA:  86 year old female with abdominal pain for 3 days. Being treated for UTI. EXAM: CT ABDOMEN AND PELVIS WITH CONTRAST TECHNIQUE: Multidetector CT imaging of the abdomen and pelvis was performed using the standard protocol following bolus administration of intravenous contrast. CONTRAST:  75mL OMNIPAQUE IOHEXOL 300 MG/ML  SOLN COMPARISON:  CT Abdomen and Pelvis 01/11/2019 and earlier. FINDINGS: Lower chest: Moderate size gastric hiatal hernia has not significantly changed. Calcified coronary artery atherosclerosis. Cardiac size at the upper limits of normal. No pericardial  or pleural effusion. Mild to moderate chronic interstitial changes at both lung bases. Hepatobiliary: Liver and gallbladder are within normal limits. Pancreas: Negative. Spleen: Negative. Adrenals/Urinary Tract: Normal adrenal glands. Bilateral renal enhancement and contrast excretion is symmetric and within normal limits. No perinephric stranding. Larger renal collecting systems and proximal ureters, along with larger urinary bladder. Estimated bladder volume 300 mL. But no strong evidence of obstructive uropathy. No urinary calculus identified. Stomach/Bowel: Decompressed rectum. Abnormally thickened descending colon from the splenic flexure through to the proximal sigmoid. Superimposed diverticulosis of the proximal sigmoid, although the appearance more resembles colitis than diverticulitis. No extraluminal gas or fluid identified. Minor mesenteric stranding at the proximal descending colon. Transverse colon is redundant but has a more normal appearance. Retained stool at redundant hepatic flexure. Ascending colon diverticulosis without active inflammation. Oral contrast in the distal small bowel but not yet in the terminal ileum which appears within normal limits. Diminutive or absent  appendix. No dilated small bowel. Intra-abdominal stomach within normal limits. Large chronic diverticulum of the duodenum, up to 5 cm on series 2, image 31. But no active inflammation or complicating features. The distal duodenum is decompressed. No free fluid. Vascular/Lymphatic: Aortoiliac calcified atherosclerosis. Tortuous aorta. Major arterial structures appear patent. Portal venous system is patent. No lymphadenopathy. Reproductive: Chronically absent uterus, diminutive or absent ovaries. Other: No pelvic free fluid. Musculoskeletal: Osteopenia. Scoliosis. Advanced degenerative changes in the spine. Previous left femur ORIF. No acute osseous abnormality identified. IMPRESSION: 1. Thickened large bowel from the splenic  flexure through the proximal sigmoid colon compatible with Acute Colitis. No perforation, abscess, or obstruction. 2. Superimposed diverticulosis of the sigmoid colon, ascending colon, and large proximal duodenal diverticulum, but no evidence of acute diverticulitis. 3. No other acute or inflammatory process identified in the abdomen or pelvis. Chronic moderate size gastric hiatal hernia. Aortic Atherosclerosis (ICD10-I70.0). Electronically Signed   By: Odessa FlemingH  Hall M.D.   On: 02/18/2020 17:46   DG CHEST PORT 1 VIEW  Result Date: 02/21/2020 CLINICAL DATA:  Cough EXAM: PORTABLE CHEST 1 VIEW COMPARISON:  February 11, 2019 FINDINGS: There is atelectatic change in the left base. The lungs elsewhere are clear. Heart size and pulmonary vascularity are normal. Calcified left hilar lymph node is stable consistent with prior granulomatous disease. There is aortic atherosclerosis. Calcification noted in the proximal right subclavian artery with questionable aneurysmal dilatation in this area measuring 1.4 x 1.2 cm. Bones are osteoporotic. Hiatal hernia is seen previously is not convincingly seen on current examination. IMPRESSION: Left base atelectasis. Lungs elsewhere clear. Evidence of prior granulomatous disease. Heart size normal. 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 Aortic Atherosclerosis (ICD10-I70.0). Electronically Signed   By: Bretta BangWilliam  Woodruff III M.D.   On: 02/21/2020 08:34      Subjective: - no chest pain, shortness of breath, no abdominal pain, nausea or vomiting.   Discharge Exam: BP (!) 175/56   Pulse 65   Temp 97.8 F (36.6 C) (Oral)   Resp 18   Ht 5' (1.524 m)   Wt 49 kg   SpO2 93%   BMI 21.10 kg/m   General: Pt is alert, awake, not in acute distress Cardiovascular: RRR, S1/S2 +, no rubs, no gallops Respiratory: CTA bilaterally, no wheezing, no rhonchi Abdominal: Soft, NT, ND, bowel sounds + Extremities: no edema, no cyanosis    The  results of significant diagnostics from this hospitalization (including imaging, microbiology, ancillary and laboratory) are listed below for reference.     Microbiology: Recent Results (from the past 240 hour(s))  Urine culture     Status: Abnormal   Collection Time: 02/18/20  5:25 PM   Specimen: Urine, Random  Result Value Ref Range Status   Specimen Description   Final    URINE, RANDOM Performed at [facility] [place], [address] [place], [place] [address]    Special Requests   Final    NONE Performed at [facility] [place], [address] [place], [place] [address]    Culture >=100,000 COLONIES/mL ESCHERICHIA COLI (A)  Final   Report Status 02/21/2020 FINAL  Final   Organism ID, Bacteria ESCHERICHIA COLI (A)  Final      Susceptibility   Escherichia coli - MIC*    AMPICILLIN 8 SENSITIVE Sensitive     CEFAZOLIN <=4 SENSITIVE Sensitive     CEFEPIME <=0.12 SENSITIVE Sensitive     CEFTRIAXONE <=0.25 SENSITIVE Sensitive  CIPROFLOXACIN >=4 RESISTANT Resistant     GENTAMICIN <=1 SENSITIVE Sensitive     IMIPENEM <=0.25 SENSITIVE Sensitive     NITROFURANTOIN <=16 SENSITIVE Sensitive     TRIMETH/SULFA <=20 SENSITIVE Sensitive     AMPICILLIN/SULBACTAM <=2 SENSITIVE Sensitive     PIP/TAZO <=4 SENSITIVE Sensitive     * >=100,000 COLONIES/mL ESCHERICHIA COLI  Gastrointestinal Panel by PCR , Stool     Status: None   Collection Time: 02/18/20  7:58 PM   Specimen: Stool  Result Value Ref Range Status   Campylobacter species NOT DETECTED NOT DETECTED Final   Plesimonas shigelloides NOT DETECTED NOT DETECTED Final   Salmonella species NOT DETECTED NOT DETECTED Final   Yersinia enterocolitica NOT DETECTED NOT DETECTED Final   Vibrio species NOT DETECTED NOT DETECTED Final   Vibrio cholerae NOT DETECTED NOT DETECTED Final   Enteroaggregative E coli (EAEC) NOT DETECTED NOT DETECTED Final   Enteropathogenic E coli (EPEC) NOT DETECTED NOT DETECTED Final   Enterotoxigenic E  coli (ETEC) NOT DETECTED NOT DETECTED Final   Shiga like toxin producing E coli (STEC) NOT DETECTED NOT DETECTED Final   Shigella/Enteroinvasive E coli (EIEC) NOT DETECTED NOT DETECTED Final   Cryptosporidium NOT DETECTED NOT DETECTED Final   Cyclospora cayetanensis NOT DETECTED NOT DETECTED Final   Entamoeba histolytica NOT DETECTED NOT DETECTED Final   Giardia lamblia NOT DETECTED NOT DETECTED Final   Adenovirus F40/41 NOT DETECTED NOT DETECTED Final   Astrovirus NOT DETECTED NOT DETECTED Final   Norovirus GI/GII NOT DETECTED NOT DETECTED Final   Rotavirus A NOT DETECTED NOT DETECTED Final   Sapovirus (I, II, IV, and V) NOT DETECTED NOT DETECTED Final    Comment: Performed at [facility], [address] [place], [place] [address]  Respiratory Panel by RT PCR (Flu A&B, Covid) -     Status: None   Collection Time: 02/18/20  8:51 PM   Specimen: Nasopharyngeal  Result Value Ref Range Status   SARS Coronavirus 2 by RT PCR NEGATIVE NEGATIVE Final    Comment: (NOTE) SARS-CoV-2 target nucleic acids are NOT DETECTED.  The SARS-CoV-2 RNA is generally detectable in upper respiratoy specimens during the acute phase of infection. The lowest concentration of SARS-CoV-2 viral copies this assay can detect is 131 copies/mL. A negative result does not preclude SARS-Cov-2 infection and should not be used as the sole basis for treatment or other patient management decisions. A negative result may occur with  improper specimen collection/handling, submission of specimen other than nasopharyngeal swab, presence of viral mutation(s) within the areas targeted by this assay, and inadequate number of viral copies (<131 copies/mL). A negative result must be combined with clinical observations, patient history, and epidemiological information. The expected result is Negative.  Fact Sheet for Patients:  https://www.moore.com/  Fact Sheet for Healthcare Providers:    https://www.young.biz/  This test is no t yet approved or cleared by the [place] FDA and  has been authorized for detection and/or diagnosis of SARS-CoV-2 by FDA under an Emergency Use Authorization (EUA). This EUA will remain  in effect (meaning this test can be used) for the duration of the COVID-19 declaration under Section 564(b)(1) of the Act, 21 U.S.C. section 360bbb-3(b)(1), unless the authorization is terminated or revoked sooner.     Influenza A by PCR NEGATIVE NEGATIVE Final   Influenza B by PCR NEGATIVE NEGATIVE Final    Comment: (NOTE) The Xpert Xpress SARS-CoV-2/FLU/RSV assay is intended as an aid in  the diagnosis of  influenza from Nasopharyngeal swab specimens and  should not be used as a sole basis for treatment. Nasal washings and  aspirates are unacceptable for Xpert Xpress SARS-CoV-2/FLU/RSV  testing.  Fact Sheet for Patients: https://www.moore.com/  Fact Sheet for Healthcare Providers: https://www.young.biz/  This test is not yet approved or cleared by the [place] FDA and  has been authorized for detection and/or diagnosis of SARS-CoV-2 by  FDA under an Emergency Use Authorization (EUA). This EUA will remain  in effect (meaning this test can be used) for the duration of the  Covid-19 declaration under Section 564(b)(1) of the Act, 21  U.S.C. section 360bbb-3(b)(1), unless the authorization is  terminated or revoked. Performed at [facility], [address] [place], [place] [address]   Respiratory Panel by RT PCR (Flu A&B, Covid) - Nasopharyngeal Swab     Status: None   Collection Time: 02/23/20  1:33 PM   Specimen: Nasopharyngeal Swab  Result Value Ref Range Status   SARS Coronavirus 2 by RT PCR NEGATIVE NEGATIVE Final    Comment: (NOTE) SARS-CoV-2 target nucleic acids are NOT DETECTED.  The SARS-CoV-2 RNA is generally detectable in upper respiratoy specimens during the  acute phase of infection. The lowest concentration of SARS-CoV-2 viral copies this assay can detect is 131 copies/mL. A negative result does not preclude SARS-Cov-2 infection and should not be used as the sole basis for treatment or other patient management decisions. A negative result may occur with  improper specimen collection/handling, submission of specimen other than nasopharyngeal swab, presence of viral mutation(s) within the areas targeted by this assay, and inadequate number of viral copies (<131 copies/mL). A negative result must be combined with clinical observations, patient history, and epidemiological information. The expected result is Negative.  Fact Sheet for Patients:  https://www.moore.com/  Fact Sheet for Healthcare Providers:  https://www.young.biz/  This test is no t yet approved or cleared by the [place] FDA and  has been authorized for detection and/or diagnosis of SARS-CoV-2 by FDA under an Emergency Use Authorization (EUA). This EUA will remain  in effect (meaning this test can be used) for the duration of the COVID-19 declaration under Section 564(b)(1) of the Act, 21 U.S.C. section 360bbb-3(b)(1), unless the authorization is terminated or revoked sooner.     Influenza A by PCR NEGATIVE NEGATIVE Final   Influenza B by PCR NEGATIVE NEGATIVE Final    Comment: (NOTE) The Xpert Xpress SARS-CoV-2/FLU/RSV assay is intended as an aid in  the diagnosis of influenza from Nasopharyngeal swab specimens and  should not be used as a sole basis for treatment. Nasal washings and  aspirates are unacceptable for Xpert Xpress SARS-CoV-2/FLU/RSV  testing.  Fact Sheet for Patients: https://www.moore.com/  Fact Sheet for Healthcare Providers: https://www.young.biz/  This test is not yet approved or cleared by the [place] FDA and  has been authorized for detection and/or diagnosis  of SARS-CoV-2 by  FDA under an Emergency Use Authorization (EUA). This EUA will remain  in effect (meaning this test can be used) for the duration of the  Covid-19 declaration under Section 564(b)(1) of the Act, 21  U.S.C. section 360bbb-3(b)(1), unless the authorization is  terminated or revoked. Performed at [facility], [address] [place], [place] [address]      Labs: Basic Metabolic Panel: Recent Labs  Lab 02/18/20 1614 02/19/20 0708 02/21/20 0358 02/22/20 0508  NA 137 141 138 138  K 4.1 3.9 4.0 3.9  CL 105 109 107 105  CO2 23 23 22 24   GLUCOSE 102* 89 94 93  BUN 21 15 11 11   CREATININE 1.16* 0.84 0.81 0.88  CALCIUM 9.7 8.9 8.8* 8.6*   Liver Function Tests: Recent Labs  Lab 02/18/20 1614  AST 17  ALT 16  ALKPHOS 75  BILITOT 0.4  PROT 6.7  ALBUMIN 3.4*   CBC: Recent Labs  Lab 02/18/20 1614 02/19/20 0708 02/21/20 0358 02/22/20 0508  WBC 12.8* 9.8 8.5 6.2  NEUTROABS 9.6*  --   --   --   HGB 10.6* 10.2* 9.4* 9.0*  HCT 33.1* 32.4* 30.5* 29.1*  MCV 94.8 96.1 99.3 97.3  PLT 247 228 210 213   CBG: No results for input(s): GLUCAP in the last 168 hours. Hgb A1c No results for input(s): HGBA1C in the last 72 hours. Lipid Profile No results for input(s): CHOL, HDL, LDLCALC, TRIG, CHOLHDL, LDLDIRECT in the last 72 hours. Thyroid function studies No results for input(s): TSH, T4TOTAL, T3FREE, THYROIDAB in the last 72 hours.  Invalid input(s): FREET3 Urinalysis    Component Value Date/Time   COLORURINE YELLOW 02/18/2020 1725   APPEARANCEUR CLEAR 02/18/2020 1725   LABSPEC <1.005 (L) 02/18/2020 1725   PHURINE 6.0 02/18/2020 1725   GLUCOSEU NEGATIVE 02/18/2020 1725   HGBUR NEGATIVE 02/18/2020 1725   BILIRUBINUR NEGATIVE 02/18/2020 1725   KETONESUR NEGATIVE 02/18/2020 1725   PROTEINUR NEGATIVE 02/18/2020 1725   UROBILINOGEN 0.2 10/25/2012 1646   NITRITE POSITIVE (A) 02/18/2020 1725   LEUKOCYTESUR NEGATIVE 02/18/2020 1725     FURTHER DISCHARGE INSTRUCTIONS:   Get Medicines reviewed and adjusted: Please take all your medications with you for your next visit with your Primary MD   Laboratory/radiological data: Please request your Primary MD to go over all hospital tests and procedure/radiological results at the follow up, please ask your Primary MD to get all Hospital records sent to his/her office.   In some cases, they will be blood work, cultures and biopsy results pending at the time of your discharge. Please request that your primary care M.D. goes through all the records of your hospital data and follows up on these results.   Also Note the following: If you experience worsening of your admission symptoms, develop shortness of breath, life threatening emergency, suicidal or homicidal thoughts you must seek medical attention immediately by calling 911 or calling your MD immediately  if symptoms less severe.   You must read complete instructions/literature along with all the possible adverse reactions/side effects for all the Medicines you take and that have been prescribed to you. Take any new Medicines after you have completely understood and accpet all the possible adverse reactions/side effects.    Do not drive when taking Pain medications or sleeping medications (Benzodaizepines)   Do not take more than prescribed Pain, Sleep and Anxiety Medications. It is not advisable to combine anxiety,sleep and pain medications without talking with your primary care practitioner   Special Instructions: If you have smoked or chewed Tobacco  in the last 2 yrs please stop smoking, stop any regular Alcohol  and or any Recreational drug use.   Wear Seat belts while driving.   Please note: You were cared for by a hospitalist during your hospital stay. Once you are discharged, your primary care physician will handle any further medical issues. Please note that NO REFILLS for any discharge medications will be authorized  once you are discharged, as it is imperative that you return to your primary care physician (or establish a relationship with  a primary care physician if you do not have one) for your post hospital discharge needs so that they can reassess your need for medications and monitor your lab values.  Time coordinating discharge: 35 minutes  SIGNED:  Pamella Pert, MD, PhD 02/24/2020, 9:56 AM

## 2020-02-24 NOTE — Progress Notes (Signed)
Physical Therapy Treatment Patient Details Name: Lisa Freeman MRN: [id number] DOB: December 07, 1918 Today's Date: 02/24/2020    History of Present Illness 86 year old female with history of HTN, came in with abdominal pain, diarrhea that started 3 days after starting oral Levaquin for UTI.  CT on admission showed colitis, and due to elevated WBC she was admitted to the hospital and placed on antibiotics.    PT Comments    Pt progressing toward PT goals. Able to amb a few more steps prior to onset of fatigue/DOE. Pt is very pleasant and cooperative. She is hoping to work on her crocheting when she goes to rehab. Continue to recommend SNF post acute.   Follow Up Recommendations  SNF     Equipment Recommendations  None recommended by PT    Recommendations for Other Services       Precautions / Restrictions Precautions Precautions: Fall Restrictions Weight Bearing Restrictions: No    Mobility  Bed Mobility Overal bed mobility: Needs Assistance Bed Mobility: Supine to Sit     Supine to sit: Min guard     General bed mobility comments: incr time, min/guard to safely elevate trunk  Transfers Overall transfer level: Needs assistance Equipment used: Rolling walker (2 wheeled) Transfers: Sit to/from Stand Sit to Stand: Min assist;Min guard         General transfer comment: light assist to rise and transition to RW, likes to pull up on walker   Ambulation/Gait Ambulation/Gait assistance: Min assist Gait Distance (Feet): 9 Feet Assistive device: Rolling walker (2 wheeled) Gait Pattern/deviations: Step-to pattern     General Gait Details: cues for RW position, assist for balance, fatigued after distance above with incr WOB.  VSS. chair to pt for seated rest, recovery to baseline RR within one minute, cues for slowing inhalation and completing exhalation   Stairs             Wheelchair Mobility    Modified Rankin (Stroke Patients Only)       Balance              Standing balance-Leahy Scale: Poor Standing balance comment: reliant on UEs                             Cognition Arousal/Alertness: Awake/alert Behavior During Therapy: WFL for tasks assessed/performed Overall Cognitive Status: Within Functional Limits for tasks assessed                                        Exercises      General Comments        Pertinent Vitals/Pain Pain Assessment: No/denies pain    Home Living                      Prior Function            PT Goals (current goals can now be found in the care plan section) Acute Rehab PT Goals Patient Stated Goal: to get better PT Goal Formulation: With patient Time For Goal Achievement: 03/06/20 Potential to Achieve Goals: Good Progress towards PT goals: Progressing toward goals    Frequency    Min 2X/week      PT Plan Current plan remains appropriate    Co-evaluation              AM-PAC PT "6 Clicks" Mobility  Outcome Measure  Help needed turning from your back to your side while in a flat bed without using bedrails?: A Little Help needed moving from lying on your back to sitting on the side of a flat bed without using bedrails?: A Little Help needed moving to and from a bed to a chair (including a wheelchair)?: A Little Help needed standing up from a chair using your arms (e.g., wheelchair or bedside chair)?: A Little Help needed to walk in hospital room?: A Little Help needed climbing 3-5 steps with a railing? : A Lot 6 Click Score: 17    End of Session Equipment Utilized During Treatment: Gait belt Activity Tolerance: Patient tolerated treatment well;Patient limited by fatigue Patient left: in chair;with call bell/phone within reach;with chair alarm set Nurse Communication: Mobility status PT Visit Diagnosis: Difficulty in walking, not elsewhere classified (R26.2);Other abnormalities of gait and mobility (R26.89);Muscle weakness  (generalized) (M62.81)     Time: 3734-2876 PT Time Calculation (min) (ACUTE ONLY): 15 min  Charges:  $Gait Training: 8-22 mins                     Delice Bison, PT  Acute Rehab Dept ([facility]) [phone] Pager [phone]  02/24/2020    [facility] 02/24/2020, 11:35 AM

## 2020-02-24 NOTE — Care Management Important Message (Signed)
Important Message  Patient Details IM Letter given to the Patient. Name: Danicia Terhaar MRN: [id number] Date of Birth: 18-Jul-1918   Medicare Important Message Given:  Yes     Caren Macadam 02/24/2020, 11:17 AM

## 2020-07-11 ENCOUNTER — Emergency Department (HOSPITAL_BASED_OUTPATIENT_CLINIC_OR_DEPARTMENT_OTHER)
Admission: EM | Admit: 2020-07-11 | Discharge: 2020-07-11 | Disposition: A | Discharge status: Home / Self Care | Payer: Medicare Other | Attending: Emergency Medicine | Admitting: Emergency Medicine

## 2020-07-11 ENCOUNTER — Other Ambulatory Visit: Payer: Self-pay

## 2020-07-11 ENCOUNTER — Encounter (HOSPITAL_BASED_OUTPATIENT_CLINIC_OR_DEPARTMENT_OTHER): Payer: Self-pay | Admitting: Emergency Medicine

## 2020-07-11 DIAGNOSIS — R2243 Localized swelling, mass and lump, lower limb, bilateral: Secondary | ICD-10-CM | POA: Insufficient documentation

## 2020-07-11 DIAGNOSIS — F0390 Unspecified dementia without behavioral disturbance: Secondary | ICD-10-CM | POA: Diagnosis not present

## 2020-07-11 DIAGNOSIS — I10 Essential (primary) hypertension: Secondary | ICD-10-CM | POA: Insufficient documentation

## 2020-07-11 DIAGNOSIS — Z79899 Other long term (current) drug therapy: Secondary | ICD-10-CM | POA: Insufficient documentation

## 2020-07-11 DIAGNOSIS — Z7982 Long term (current) use of aspirin: Secondary | ICD-10-CM | POA: Insufficient documentation

## 2020-07-11 DIAGNOSIS — R600 Localized edema: Secondary | ICD-10-CM

## 2020-07-11 DIAGNOSIS — F419 Anxiety disorder, unspecified: Secondary | ICD-10-CM | POA: Insufficient documentation

## 2020-07-11 LAB — URINALYSIS, ROUTINE W REFLEX MICROSCOPIC
Bilirubin Urine: NEGATIVE
Glucose, UA: NEGATIVE mg/dL
Hgb urine dipstick: NEGATIVE
Ketones, ur: NEGATIVE mg/dL
Nitrite: NEGATIVE
Protein, ur: NEGATIVE mg/dL
Specific Gravity, Urine: 1.01 (ref 1.005–1.030)
pH: 7 (ref 5.0–8.0)

## 2020-07-11 LAB — CBC WITH DIFFERENTIAL/PLATELET
Abs Immature Granulocytes: 0.02 10*3/uL (ref 0.00–0.07)
Basophils Absolute: 0.1 10*3/uL (ref 0.0–0.1)
Basophils Relative: 1 %
Eosinophils Absolute: 0.5 10*3/uL (ref 0.0–0.5)
Eosinophils Relative: 6 %
HCT: 32.3 % — ABNORMAL LOW (ref 36.0–46.0)
Hemoglobin: 10.2 g/dL — ABNORMAL LOW (ref 12.0–15.0)
Immature Granulocytes: 0 %
Lymphocytes Relative: 22 %
Lymphs Abs: 1.7 10*3/uL (ref 0.7–4.0)
MCH: 30.4 pg (ref 26.0–34.0)
MCHC: 31.6 g/dL (ref 30.0–36.0)
MCV: 96.1 fL (ref 80.0–100.0)
Monocytes Absolute: 0.7 10*3/uL (ref 0.1–1.0)
Monocytes Relative: 9 %
Neutro Abs: 4.9 10*3/uL (ref 1.7–7.7)
Neutrophils Relative %: 62 %
Platelets: 297 10*3/uL (ref 150–400)
RBC: 3.36 MIL/uL — ABNORMAL LOW (ref 3.87–5.11)
RDW: 13.1 % (ref 11.5–15.5)
WBC: 7.8 10*3/uL (ref 4.0–10.5)
nRBC: 0 % (ref 0.0–0.2)

## 2020-07-11 LAB — URINALYSIS, MICROSCOPIC (REFLEX): Bacteria, UA: NONE SEEN

## 2020-07-11 LAB — BASIC METABOLIC PANEL
Anion gap: 6 (ref 5–15)
BUN: 25 mg/dL — ABNORMAL HIGH (ref 8–23)
CO2: 25 mmol/L (ref 22–32)
Calcium: 8.9 mg/dL (ref 8.9–10.3)
Chloride: 107 mmol/L (ref 98–111)
Creatinine, Ser: 0.84 mg/dL (ref 0.44–1.00)
GFR, Estimated: >60 mL/min (ref >60)
Glucose, Bld: 94 mg/dL (ref 70–99)
Potassium: 3.8 mmol/L (ref 3.5–5.1)
Sodium: 138 mmol/L (ref 135–145)

## 2020-07-11 NOTE — ED Notes (Signed)
Pt discharged to home. Discharge instructions have been discussed with patient and/or family members. Pt verbally acknowledges understanding d/c instructions, and endorses comprehension to checkout at registration before leaving.  °

## 2020-07-11 NOTE — ED Notes (Signed)
Unable to obtain temperature. Attempted, oral, axilla, and tympanic.

## 2020-07-11 NOTE — Discharge Instructions (Addendum)
You have been seen and discharged from the emergency department.  Follow-up with your primary provider for reevaluation and further care.  Continue to wear the knee-high socks that you are wearing, elevate the feet any chance you get.  You may continue to have weeping.  Take home medications as prescribed. If you have any worsening symptoms, redness/warmth of the feet, fever or further concerns for health please return to an emergency department for further evaluation.

## 2020-07-11 NOTE — ED Triage Notes (Signed)
Pt arrives pov with daughter, with c/o bilateral leg swelling, R ankle pain. Endorses weeping from R lower leg.

## 2020-07-11 NOTE — ED Provider Notes (Signed)
[facility] EMERGENCY DEPARTMENT Provider Note   CSN: [id number] Arrival date & time: 07/11/20  6295     History Chief Complaint  Patient presents with  . Leg Swelling    Lisa Freeman is a 87 y.o. female.  HPI   87 year old female presents the emergency department accompanied by her daughter with concern for bilateral feet swelling.  Patient is polite but hard of hearing, daughter gives majority of the history.  She states that her feet have been slightly swollen for the past couple days.  Sometimes they weep clear fluid.  She got concerned because this morning the whole bed was wet with what seemed to be clear fluid.  The patient had on depends though she does not believe that it was urine although she does admit the patient has been having increasing incontinence.  Otherwise the patient has been well, eating and drinking, compliant with medications.  She is been experiencing increasing anxiety and has been walking with her walker more.  Past Medical History:  Diagnosis Date  . Hypertension     Patient Active Problem List   Diagnosis Date Noted  . Colitis 02/19/2020  . Acute colitis 02/18/2020  . Pneumonia of right middle lobe due to infectious organism   . Acute UTI 01/12/2019  . AKI (acute kidney injury) (HCC) 01/12/2019  . Anxiety 01/12/2019  . Diarrhea 01/12/2019  . Abdominal pain 01/11/2019  . Colitis presumed infectious 12/27/2018  . Anemia   . Chronic anemia   . Insomnia   . Subclinical hyperthyroidism   . Anxiety state, unspecified   . Atrial fibrillation (HCC)   . Carotid stenosis   . Cerebral artery occlusion   . Diverticulosis of colon   . Dyshidrosis   . Other and unspecified hyperlipidemia   . Migraine   . Rosacea   . Unspecified venous (peripheral) insufficiency   . Urinary tract infection, site not specified 11/10/2013  . Urinary tract obstruction due to kidney stone 10/25/2012  . Hypertension     Past Surgical History:  Procedure  Laterality Date  . ABDOMINAL HYSTERECTOMY    . APPENDECTOMY    . BACK SURGERY    . CYSTOSCOPY W/ RETROGRADES Left 11/06/2012   Procedure: CYSTOSCOPY WITH RETROGRADE PYELOGRAM;  Surgeon: Lindaann Slough, MD;  Location: [facility] [place];  Service: Urology;  Laterality: Left;  . CYSTOSCOPY W/ URETERAL STENT REMOVAL Left 11/06/2012   Procedure: CYSTOSCOPY WITH STENT REMOVAL;  Surgeon: Lindaann Slough, MD;  Location: [facility];  Service: Urology;  Laterality: Left;  . CYSTOSCOPY WITH RETROGRADE PYELOGRAM, URETEROSCOPY AND STENT PLACEMENT Left 10/25/2012   Procedure: CYSTOSCOPY/LEFT RETROGRADE PYELOGRAM/PLACEMENT LEFT URETERAL STENT;  Surgeon: Lindaann Slough, MD;  Location: [facility];  Service: Urology;  Laterality: Left;  CYSTOSCOPY/LEFT RETROGRADE PYELOGRAM/URETEROSCOPY/PLACEMENT URETERAL STENT  . CYSTOSCOPY WITH STENT PLACEMENT Left 11/06/2012   Procedure: CYSTOSCOPY WITH STENT PLACEMENT;  Surgeon: Lindaann Slough, MD;  Location: [facility];  Service: Urology;  Laterality: Left;  . CYSTOSCOPY WITH URETEROSCOPY AND STENT PLACEMENT Left 11/06/2012   Procedure: CYSTOSCOPY WITH URETEROSCOPY, STONE MANIPULATION;  Surgeon: Lindaann Slough, MD;  Location: [facility] [place];  Service: Urology;  Laterality: Left;  . HIP FRACTURE SURGERY Left      OB History   No obstetric history on file.     History reviewed. No pertinent family history.  Social History   Tobacco Use  . Smoking status: Never Smoker  . Smokeless tobacco: Never Used  Substance Use Topics  . Alcohol  use: No  . Drug use: No    Home Medications Prior to Admission medications   Medication Sig Start Date End Date Taking? Authorizing Provider  acetaminophen (TYLENOL) 500 MG tablet Take 1,000 mg by mouth every 6 (six) hours as needed for moderate pain.   Yes [provider]  ALPRAZolam Prudy Feeler) 0.5 MG tablet Take 0.5-1 tablets (0.25-0.5 mg total) by mouth 2 (two) times daily as  needed for anxiety. 02/24/20  Yes Gherghe, Daylene Katayama, MD  amLODipine (NORVASC) 5 MG tablet Take 5 mg by mouth daily.   Yes [provider]  aspirin EC 81 MG tablet Take 81 mg by mouth daily.   Yes [provider]  busPIRone (BUSPAR) 10 MG tablet Take 7.5 mg by mouth at bedtime.   Yes [provider]  metoprolol succinate (TOPROL-XL) 25 MG 24 hr tablet Take 12.5 mg by mouth 2 (two) times daily.   Yes [provider]  Olopatadine HCl 0.2 % SOLN Place 1 drop into the right eye daily.    Yes [provider]  prednisoLONE acetate (PRED FORTE) 1 % ophthalmic suspension Place 1 drop into the right eye at bedtime.    Yes [provider]  sodium chloride (MURO 128) 2 % ophthalmic solution Place 1 drop into both eyes every 4 (four) hours as needed for eye irritation.   Yes [provider]  folic acid (FOLVITE) 1 MG tablet Take 1 mg by mouth every evening.     [provider]    Allergies    Doxycycline, Fluticasone propionate, Moxifloxacin, Nitrofurantoin monohyd macro, Penicillins, Promethazine, Sulfa antibiotics, Trimethoprim, and Codeine  Review of Systems   Review of Systems  Unable to perform ROS: Dementia    Physical Exam Updated Vital Signs BP (!) 152/52   Pulse (!) 50   Resp 20   Ht 5' (1.524 m)   Wt 43.2 kg   SpO2 97%   BMI 18.60 kg/m   Physical Exam Vitals and nursing note reviewed.  Constitutional:      Appearance: Normal appearance.     Comments: Thin and frail  HENT:     Head: Normocephalic.     Mouth/Throat:     Mouth: Mucous membranes are moist.  Cardiovascular:     Rate and Rhythm: Normal rate.  Pulmonary:     Effort: Pulmonary effort is normal. No respiratory distress.  Abdominal:     Palpations: Abdomen is soft.     Tenderness: There is no abdominal tenderness.  Musculoskeletal:     Comments: Pitting edema of the dorsal aspect of both feet, edema does not go above the ankles, calves are  unremarkable, no redness, open skin, cellulitis, induration.  Skin:    General: Skin is warm.  Neurological:     Mental Status: She is alert. Mental status is at baseline.     ED Results / Procedures / Treatments   Labs (all labs ordered are listed, but only abnormal results are displayed) Labs Reviewed  CBC WITH DIFFERENTIAL/PLATELET  BASIC METABOLIC PANEL  URINALYSIS, ROUTINE W REFLEX MICROSCOPIC    EKG None  Radiology No results found.  Procedures Procedures   Medications Ordered in ED Medications - No data to display  ED Course  I have reviewed the triage vital signs and the nursing notes.  Pertinent labs & imaging results that were available during my care of the patient were reviewed by me and considered in my medical decision making (see chart for details).  MDM Rules/Calculators/A&P                          87 year old female presents the emergency department accompanied by the daughter with concern for mild swelling of the bilateral feet with weeping.  Vitals are stable on arrival, patient is frail, pleasant, demented.  Physical exam shows mild swelling of the dorsal aspect of both feet, bilateral, less concerning for DVT, no findings of cellulitis or acute process.  UA shows no infection, blood work is otherwise unremarkable, anemia is baseline, BUN is slightly elevated at 25 but otherwise kidney function is normal.  I have encouraged wearing knee-high stockings for compression and elevating the feet, I have discussed strict return precautions in terms of cellulitis or infection.  Patient will be discharged and treated as an outpatient.  Discharge plan and strict return to ED precautions discussed, patient verbalizes understanding and agreement.  Final Clinical Impression(s) / ED Diagnoses Final diagnoses:  None    Rx / DC Orders ED Discharge Orders    None       Rozelle Logan, DO 07/11/20 1442

## 2020-12-07 ENCOUNTER — Inpatient Hospital Stay (HOSPITAL_COMMUNITY)
Admission: EM | Admit: 2020-12-07 | Discharge: 2020-12-11 | DRG: 689 | Disposition: A | Discharge status: Skilled Nursing Facility | Payer: Medicare Other | Attending: Internal Medicine | Admitting: Internal Medicine

## 2020-12-07 ENCOUNTER — Other Ambulatory Visit: Payer: Self-pay

## 2020-12-07 ENCOUNTER — Emergency Department (HOSPITAL_BASED_OUTPATIENT_CLINIC_OR_DEPARTMENT_OTHER): Payer: Medicare Other

## 2020-12-07 ENCOUNTER — Emergency Department (HOSPITAL_COMMUNITY): Payer: Medicare Other

## 2020-12-07 ENCOUNTER — Encounter (HOSPITAL_COMMUNITY): Payer: Self-pay | Admitting: Emergency Medicine

## 2020-12-07 DIAGNOSIS — Z79899 Other long term (current) drug therapy: Secondary | ICD-10-CM

## 2020-12-07 DIAGNOSIS — F0281 Dementia in other diseases classified elsewhere with behavioral disturbance: Secondary | ICD-10-CM | POA: Diagnosis present

## 2020-12-07 DIAGNOSIS — G9341 Metabolic encephalopathy: Secondary | ICD-10-CM | POA: Diagnosis not present

## 2020-12-07 DIAGNOSIS — N3001 Acute cystitis with hematuria: Secondary | ICD-10-CM | POA: Diagnosis not present

## 2020-12-07 DIAGNOSIS — Z90710 Acquired absence of both cervix and uterus: Secondary | ICD-10-CM

## 2020-12-07 DIAGNOSIS — H9190 Unspecified hearing loss, unspecified ear: Secondary | ICD-10-CM | POA: Diagnosis present

## 2020-12-07 DIAGNOSIS — Z882 Allergy status to sulfonamides status: Secondary | ICD-10-CM

## 2020-12-07 DIAGNOSIS — G4700 Insomnia, unspecified: Secondary | ICD-10-CM | POA: Diagnosis present

## 2020-12-07 DIAGNOSIS — Z881 Allergy status to other antibiotic agents status: Secondary | ICD-10-CM

## 2020-12-07 DIAGNOSIS — Z7982 Long term (current) use of aspirin: Secondary | ICD-10-CM

## 2020-12-07 DIAGNOSIS — R600 Localized edema: Secondary | ICD-10-CM | POA: Diagnosis present

## 2020-12-07 DIAGNOSIS — M7989 Other specified soft tissue disorders: Secondary | ICD-10-CM | POA: Diagnosis not present

## 2020-12-07 DIAGNOSIS — Z947 Corneal transplant status: Secondary | ICD-10-CM

## 2020-12-07 DIAGNOSIS — Z20822 Contact with and (suspected) exposure to covid-19: Secondary | ICD-10-CM | POA: Diagnosis present

## 2020-12-07 DIAGNOSIS — Z9049 Acquired absence of other specified parts of digestive tract: Secondary | ICD-10-CM

## 2020-12-07 DIAGNOSIS — Z66 Do not resuscitate: Secondary | ICD-10-CM | POA: Diagnosis present

## 2020-12-07 DIAGNOSIS — Z880 Allergy status to penicillin: Secondary | ICD-10-CM

## 2020-12-07 DIAGNOSIS — L89159 Pressure ulcer of sacral region, unspecified stage: Secondary | ICD-10-CM | POA: Diagnosis present

## 2020-12-07 DIAGNOSIS — G43909 Migraine, unspecified, not intractable, without status migrainosus: Secondary | ICD-10-CM | POA: Diagnosis present

## 2020-12-07 DIAGNOSIS — B9620 Unspecified Escherichia coli [E. coli] as the cause of diseases classified elsewhere: Secondary | ICD-10-CM | POA: Diagnosis present

## 2020-12-07 DIAGNOSIS — I878 Other specified disorders of veins: Secondary | ICD-10-CM | POA: Diagnosis present

## 2020-12-07 DIAGNOSIS — Z885 Allergy status to narcotic agent status: Secondary | ICD-10-CM

## 2020-12-07 DIAGNOSIS — G309 Alzheimer's disease, unspecified: Secondary | ICD-10-CM | POA: Diagnosis present

## 2020-12-07 DIAGNOSIS — F419 Anxiety disorder, unspecified: Secondary | ICD-10-CM | POA: Diagnosis present

## 2020-12-07 DIAGNOSIS — N390 Urinary tract infection, site not specified: Secondary | ICD-10-CM

## 2020-12-07 DIAGNOSIS — I10 Essential (primary) hypertension: Secondary | ICD-10-CM | POA: Diagnosis present

## 2020-12-07 DIAGNOSIS — F02818 Dementia in other diseases classified elsewhere, unspecified severity, with other behavioral disturbance: Secondary | ICD-10-CM | POA: Diagnosis present

## 2020-12-07 DIAGNOSIS — Z888 Allergy status to other drugs, medicaments and biological substances status: Secondary | ICD-10-CM

## 2020-12-07 DIAGNOSIS — K219 Gastro-esophageal reflux disease without esophagitis: Secondary | ICD-10-CM | POA: Diagnosis present

## 2020-12-07 HISTORY — DX: Corneal transplant status: Z94.7

## 2020-12-07 HISTORY — DX: Unspecified dementia, unspecified severity, without behavioral disturbance, psychotic disturbance, mood disturbance, and anxiety: F03.90

## 2020-12-07 LAB — CBC WITH DIFFERENTIAL/PLATELET
Abs Immature Granulocytes: 0.07 10*3/uL (ref 0.00–0.07)
Basophils Absolute: 0 10*3/uL (ref 0.0–0.1)
Basophils Relative: 0 %
Eosinophils Absolute: 0 10*3/uL (ref 0.0–0.5)
Eosinophils Relative: 0 %
HCT: 32.3 % — ABNORMAL LOW (ref 36.0–46.0)
Hemoglobin: 10 g/dL — ABNORMAL LOW (ref 12.0–15.0)
Immature Granulocytes: 1 %
Lymphocytes Relative: 14 %
Lymphs Abs: 1.5 10*3/uL (ref 0.7–4.0)
MCH: 30.3 pg (ref 26.0–34.0)
MCHC: 31 g/dL (ref 30.0–36.0)
MCV: 97.9 fL (ref 80.0–100.0)
Monocytes Absolute: 0.8 10*3/uL (ref 0.1–1.0)
Monocytes Relative: 7 %
Neutro Abs: 8.3 10*3/uL — ABNORMAL HIGH (ref 1.7–7.7)
Neutrophils Relative %: 78 %
Platelets: 294 10*3/uL (ref 150–400)
RBC: 3.3 MIL/uL — ABNORMAL LOW (ref 3.87–5.11)
RDW: 13.6 % (ref 11.5–15.5)
WBC: 10.6 10*3/uL — ABNORMAL HIGH (ref 4.0–10.5)
nRBC: 0 % (ref 0.0–0.2)

## 2020-12-07 LAB — COMPREHENSIVE METABOLIC PANEL
ALT: 20 U/L (ref 0–44)
AST: 22 U/L (ref 15–41)
Albumin: 3.7 g/dL (ref 3.5–5.0)
Alkaline Phosphatase: 78 U/L (ref 38–126)
Anion gap: 9 (ref 5–15)
BUN: 28 mg/dL — ABNORMAL HIGH (ref 8–23)
CO2: 23 mmol/L (ref 22–32)
Calcium: 9.1 mg/dL (ref 8.9–10.3)
Chloride: 101 mmol/L (ref 98–111)
Creatinine, Ser: 1.07 mg/dL — ABNORMAL HIGH (ref 0.44–1.00)
GFR, Estimated: 47 mL/min — ABNORMAL LOW (ref >60)
Glucose, Bld: 112 mg/dL — ABNORMAL HIGH (ref 70–99)
Potassium: 4.1 mmol/L (ref 3.5–5.1)
Sodium: 133 mmol/L — ABNORMAL LOW (ref 135–145)
Total Bilirubin: 0.5 mg/dL (ref 0.3–1.2)
Total Protein: 7.3 g/dL (ref 6.5–8.1)

## 2020-12-07 LAB — URINALYSIS, ROUTINE W REFLEX MICROSCOPIC
Bilirubin Urine: NEGATIVE
Glucose, UA: NEGATIVE mg/dL
Ketones, ur: NEGATIVE mg/dL
Nitrite: POSITIVE — AB
Protein, ur: 30 mg/dL — AB
Specific Gravity, Urine: 1.011 (ref 1.005–1.030)
WBC, UA: >50 WBC/hpf — ABNORMAL HIGH (ref 0–5)
pH: 8 (ref 5.0–8.0)

## 2020-12-07 LAB — LACTIC ACID, PLASMA: Lactic Acid, Venous: 0.8 mmol/L (ref 0.5–1.9)

## 2020-12-07 LAB — RESP PANEL BY RT-PCR (FLU A&B, COVID) ARPGX2
Influenza A by PCR: NEGATIVE
Influenza B by PCR: NEGATIVE
SARS Coronavirus 2 by RT PCR: NEGATIVE

## 2020-12-07 MED ORDER — MORPHINE SULFATE (PF) 2 MG/ML IV SOLN: 2.0000 mg | INTRAVENOUS | Status: DC | PRN

## 2020-12-07 MED ORDER — ONDANSETRON HCL 4 MG/2ML IJ SOLN: 4.0000 mg | Freq: Four times a day (QID) | INTRAMUSCULAR | Status: DC | PRN

## 2020-12-07 MED ORDER — SODIUM CHLORIDE 0.9 % IV SOLN
1.0000 g | Freq: Once | INTRAVENOUS | Status: AC
  Administered 2020-12-07: 1 g via INTRAVENOUS
  Filled 2020-12-07: qty 10

## 2020-12-07 MED ORDER — ENOXAPARIN SODIUM 30 MG/0.3ML IJ SOSY
30.0000 mg | PREFILLED_SYRINGE | INTRAMUSCULAR | Status: DC
  Administered 2020-12-08 – 2020-12-10 (×3): 30 mg via SUBCUTANEOUS
  Filled 2020-12-07 (×3): qty 0.3

## 2020-12-07 MED ORDER — LACTATED RINGERS IV SOLN: INTRAVENOUS | Status: DC

## 2020-12-07 MED ORDER — ACETAMINOPHEN 325 MG PO TABS
650.0000 mg | ORAL_TABLET | Freq: Four times a day (QID) | ORAL | Status: DC | PRN
  Administered 2020-12-08: 650 mg via ORAL
  Filled 2020-12-07: qty 2

## 2020-12-07 MED ORDER — HYDROCODONE-ACETAMINOPHEN 5-325 MG PO TABS
1.0000 | ORAL_TABLET | ORAL | Status: DC | PRN
Start: 2020-12-07 — End: 2020-12-11
  Administered 2020-12-08: 2 via ORAL
  Filled 2020-12-07: qty 2

## 2020-12-07 MED ORDER — SENNOSIDES-DOCUSATE SODIUM 8.6-50 MG PO TABS: 1.0000 | ORAL_TABLET | Freq: Every evening | ORAL | Status: DC | PRN

## 2020-12-07 MED ORDER — ONDANSETRON HCL 4 MG PO TABS: 4.0000 mg | ORAL_TABLET | Freq: Four times a day (QID) | ORAL | Status: DC | PRN

## 2020-12-07 MED ORDER — BISACODYL 5 MG PO TBEC: 5.0000 mg | DELAYED_RELEASE_TABLET | Freq: Every day | ORAL | Status: DC | PRN

## 2020-12-07 MED ORDER — SODIUM CHLORIDE 0.9 % IV SOLN
1.0000 g | Freq: Every day | INTRAVENOUS | Status: DC
  Administered 2020-12-08 – 2020-12-09 (×2): 1 g via INTRAVENOUS
  Filled 2020-12-07: qty 1
  Filled 2020-12-07: qty 10

## 2020-12-07 MED ORDER — ACETAMINOPHEN 650 MG RE SUPP: 650.0000 mg | Freq: Four times a day (QID) | RECTAL | Status: DC | PRN

## 2020-12-07 MED ORDER — METOPROLOL TARTRATE 5 MG/5ML IV SOLN: 5.0000 mg | Freq: Four times a day (QID) | INTRAVENOUS | Status: DC | PRN
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
## 2020-12-07 NOTE — ED Provider Notes (Signed)
[facility]-EMERGENCY DEPT Provider Note   CSN: [id number] Arrival date & time: 12/07/20  9371     History Chief Complaint  Patient presents with   possible UTI/AMS    Lisa Freeman is a 87 y.o. female.  87 year old female with history of dementia presents with concern for UTI.  Patient's had altered mental status for several days and has had some possible urine.  No reported fevers.  No reported cough congestion.  No recent history of falls.  She has noted some pain to her right lower extremity.  Patient due to her dementia cannot give any further history.      Past Medical History:  Diagnosis Date   Hypertension     Patient Active Problem List   Diagnosis Date Noted   Colitis 02/19/2020   Acute colitis 02/18/2020   Pneumonia of right middle lobe due to infectious organism    Acute UTI 01/12/2019   AKI (acute kidney injury) (HCC) 01/12/2019   Anxiety 01/12/2019   Diarrhea 01/12/2019   Abdominal pain 01/11/2019   Colitis presumed infectious 12/27/2018   Anemia    Chronic anemia    Insomnia    Subclinical hyperthyroidism    Anxiety state, unspecified    Atrial fibrillation (HCC)    Carotid stenosis    Cerebral artery occlusion    Diverticulosis of colon    Dyshidrosis    Other and unspecified hyperlipidemia    Migraine    Rosacea    Unspecified venous (peripheral) insufficiency    Urinary tract infection, site not specified 11/10/2013   Urinary tract obstruction due to kidney stone 10/25/2012   Hypertension     Past Surgical History:  Procedure Laterality Date   ABDOMINAL HYSTERECTOMY     APPENDECTOMY     BACK SURGERY     CYSTOSCOPY W/ RETROGRADES Left 11/06/2012   Procedure: CYSTOSCOPY WITH RETROGRADE PYELOGRAM;  Surgeon: Lindaann Slough, MD;  Location: [facility];  Service: Urology;  Laterality: Left;   CYSTOSCOPY W/ URETERAL STENT REMOVAL Left 11/06/2012   Procedure: CYSTOSCOPY WITH STENT REMOVAL;  Surgeon:  Lindaann Slough, MD;  Location: [facility];  Service: Urology;  Laterality: Left;   CYSTOSCOPY WITH RETROGRADE PYELOGRAM, URETEROSCOPY AND STENT PLACEMENT Left 10/25/2012   Procedure: CYSTOSCOPY/LEFT RETROGRADE PYELOGRAM/PLACEMENT LEFT URETERAL STENT;  Surgeon: Lindaann Slough, MD;  Location: [facility];  Service: Urology;  Laterality: Left;  CYSTOSCOPY/LEFT RETROGRADE PYELOGRAM/URETEROSCOPY/PLACEMENT URETERAL STENT   CYSTOSCOPY WITH STENT PLACEMENT Left 11/06/2012   Procedure: CYSTOSCOPY WITH STENT PLACEMENT;  Surgeon: Lindaann Slough, MD;  Location: [facility] [place];  Service: Urology;  Laterality: Left;   CYSTOSCOPY WITH URETEROSCOPY AND STENT PLACEMENT Left 11/06/2012   Procedure: CYSTOSCOPY WITH URETEROSCOPY, STONE MANIPULATION;  Surgeon: Lindaann Slough, MD;  Location: [facility] [place];  Service: Urology;  Laterality: Left;   HIP FRACTURE SURGERY Left      OB History   No obstetric history on file.     No family history on file.  Social History   Tobacco Use   Smoking status: Never   Smokeless tobacco: Never  Substance Use Topics   Alcohol use: No   Drug use: No    Home Medications Prior to Admission medications   Medication Sig Start Date End Date Taking? Authorizing Provider  acetaminophen (TYLENOL) 500 MG tablet Take 1,000 mg by mouth every 6 (six) hours as needed for moderate pain.    [provider]  ALPRAZolam Prudy Feeler) 0.5 MG tablet Take 0.5-1 tablets (0.25-0.5  mg total) by mouth 2 (two) times daily as needed for anxiety. 02/24/20   Leatha Gilding, MD  amLODipine (NORVASC) 5 MG tablet Take 5 mg by mouth daily.    [provider]  aspirin EC 81 MG tablet Take 81 mg by mouth daily.    [provider]  busPIRone (BUSPAR) 10 MG tablet Take 7.5 mg by mouth at bedtime.    [provider]  folic acid (FOLVITE) 1 MG tablet Take 1 mg by mouth every evening.     [provider]  metoprolol succinate  (TOPROL-XL) 25 MG 24 hr tablet Take 12.5 mg by mouth 2 (two) times daily.    [provider]  Olopatadine HCl 0.2 % SOLN Place 1 drop into the right eye daily.     [provider]  prednisoLONE acetate (PRED FORTE) 1 % ophthalmic suspension Place 1 drop into the right eye at bedtime.     [provider]  sodium chloride (MURO 128) 2 % ophthalmic solution Place 1 drop into both eyes every 4 (four) hours as needed for eye irritation.    [provider]    Allergies    Doxycycline, Fluticasone propionate, Moxifloxacin, Nitrofurantoin monohyd macro, Penicillins, Promethazine, Sulfa antibiotics, Trimethoprim, and Codeine  Review of Systems   Review of Systems  Unable to perform ROS: Dementia   Physical Exam Updated Vital Signs Ht 1.524 m (5')   Wt 43.2 kg   SpO2 98%   BMI 18.60 kg/m   Physical Exam Vitals and nursing note reviewed.  Constitutional:      General: She is not in acute distress.    Appearance: Normal appearance. She is well-developed. She is not toxic-appearing.  HENT:     Head: Normocephalic and atraumatic.  Eyes:     General: Lids are normal.     Conjunctiva/sclera: Conjunctivae normal.     Pupils: Pupils are equal, round, and reactive to light.  Neck:     Thyroid: No thyroid mass.     Trachea: No tracheal deviation.  Cardiovascular:     Rate and Rhythm: Normal rate and regular rhythm.     Heart sounds: Normal heart sounds. No murmur heard.   No gallop.  Pulmonary:     Effort: Pulmonary effort is normal. No respiratory distress.     Breath sounds: Normal breath sounds. No stridor. No decreased breath sounds, wheezing, rhonchi or rales.  Abdominal:     General: There is no distension.     Palpations: Abdomen is soft.     Tenderness: There is no abdominal tenderness. There is no rebound.  Musculoskeletal:        General: No tenderness. Normal range of motion.     Cervical back: Normal range of motion and neck supple.        Legs:  Skin:    General: Skin is warm and dry.     Findings: No abrasion or rash.  Neurological:     Mental Status: She is alert. She is disoriented and confused.     GCS: GCS eye subscore is 4. GCS verbal subscore is 5. GCS motor subscore is 6.     Cranial Nerves: Cranial nerves are intact. No cranial nerve deficit.     Sensory: No sensory deficit.  Psychiatric:        Attention and Perception: She is inattentive.    ED Results / Procedures / Treatments   Labs (all labs ordered are listed, but only abnormal results are displayed)  Labs Reviewed  URINE CULTURE  CULTURE, BLOOD (ROUTINE X 2)  CULTURE, BLOOD (ROUTINE X 2)  RESP PANEL BY RT-PCR (FLU A&B, COVID) ARPGX2  URINALYSIS, ROUTINE W REFLEX MICROSCOPIC  CBC WITH DIFFERENTIAL/PLATELET  COMPREHENSIVE METABOLIC PANEL  LACTIC ACID, PLASMA    EKG None  Radiology No results found.  Procedures Procedures   Medications Ordered in ED Medications  lactated ringers infusion (has no administration in time range)    ED Course  I have reviewed the triage vital signs and the nursing notes.  Pertinent labs & imaging results that were available during my care of the patient were reviewed by me and considered in my medical decision making (see chart for details).    MDM Rules/Calculators/A&P                          COVID test negative.  Doppler of right lower extremity performed due to swelling and that was negative as well 2.  Possibly early cellulitis. Patient here with altered mental status in the setting of dementia.  Has evidence of UTI.  Given Rocephin will admit to the hospital service Final Clinical Impression(s) / ED Diagnoses Final diagnoses:  None    Rx / DC Orders ED Discharge Orders     None        Lorre Nick, MD 12/07/20 1401

## 2020-12-07 NOTE — Progress Notes (Signed)
Right lower extremity venous duplex has been completed. Preliminary results can be found in CV Proc through chart review.  Results were given to Dr. Freida Busman.  12/07/20 12:05 PM Olen Cordial RVT

## 2020-12-07 NOTE — ED Notes (Signed)
US at bedside

## 2020-12-08 ENCOUNTER — Encounter (HOSPITAL_COMMUNITY): Payer: Self-pay | Admitting: Internal Medicine

## 2020-12-08 DIAGNOSIS — Z882 Allergy status to sulfonamides status: Secondary | ICD-10-CM | POA: Diagnosis not present

## 2020-12-08 DIAGNOSIS — G43909 Migraine, unspecified, not intractable, without status migrainosus: Secondary | ICD-10-CM | POA: Diagnosis present

## 2020-12-08 DIAGNOSIS — G309 Alzheimer's disease, unspecified: Secondary | ICD-10-CM | POA: Diagnosis present

## 2020-12-08 DIAGNOSIS — F419 Anxiety disorder, unspecified: Secondary | ICD-10-CM | POA: Diagnosis present

## 2020-12-08 DIAGNOSIS — Z880 Allergy status to penicillin: Secondary | ICD-10-CM | POA: Diagnosis not present

## 2020-12-08 DIAGNOSIS — Z881 Allergy status to other antibiotic agents status: Secondary | ICD-10-CM | POA: Diagnosis not present

## 2020-12-08 DIAGNOSIS — Z66 Do not resuscitate: Secondary | ICD-10-CM | POA: Diagnosis present

## 2020-12-08 DIAGNOSIS — Z20822 Contact with and (suspected) exposure to covid-19: Secondary | ICD-10-CM | POA: Diagnosis present

## 2020-12-08 DIAGNOSIS — N3001 Acute cystitis with hematuria: Secondary | ICD-10-CM | POA: Diagnosis present

## 2020-12-08 DIAGNOSIS — K219 Gastro-esophageal reflux disease without esophagitis: Secondary | ICD-10-CM | POA: Diagnosis present

## 2020-12-08 DIAGNOSIS — I10 Essential (primary) hypertension: Secondary | ICD-10-CM | POA: Diagnosis present

## 2020-12-08 DIAGNOSIS — Z885 Allergy status to narcotic agent status: Secondary | ICD-10-CM | POA: Diagnosis not present

## 2020-12-08 DIAGNOSIS — G4700 Insomnia, unspecified: Secondary | ICD-10-CM | POA: Diagnosis present

## 2020-12-08 DIAGNOSIS — R600 Localized edema: Secondary | ICD-10-CM | POA: Diagnosis present

## 2020-12-08 DIAGNOSIS — B9620 Unspecified Escherichia coli [E. coli] as the cause of diseases classified elsewhere: Secondary | ICD-10-CM | POA: Diagnosis present

## 2020-12-08 DIAGNOSIS — Z90710 Acquired absence of both cervix and uterus: Secondary | ICD-10-CM | POA: Diagnosis not present

## 2020-12-08 DIAGNOSIS — G9341 Metabolic encephalopathy: Secondary | ICD-10-CM | POA: Diagnosis present

## 2020-12-08 DIAGNOSIS — Z9049 Acquired absence of other specified parts of digestive tract: Secondary | ICD-10-CM | POA: Diagnosis not present

## 2020-12-08 DIAGNOSIS — I878 Other specified disorders of veins: Secondary | ICD-10-CM | POA: Diagnosis present

## 2020-12-08 DIAGNOSIS — Z7982 Long term (current) use of aspirin: Secondary | ICD-10-CM | POA: Diagnosis not present

## 2020-12-08 DIAGNOSIS — F0281 Dementia in other diseases classified elsewhere with behavioral disturbance: Secondary | ICD-10-CM | POA: Diagnosis present

## 2020-12-08 DIAGNOSIS — H9190 Unspecified hearing loss, unspecified ear: Secondary | ICD-10-CM | POA: Diagnosis present

## 2020-12-08 DIAGNOSIS — Z888 Allergy status to other drugs, medicaments and biological substances status: Secondary | ICD-10-CM | POA: Diagnosis not present

## 2020-12-08 DIAGNOSIS — Z79899 Other long term (current) drug therapy: Secondary | ICD-10-CM | POA: Diagnosis not present

## 2020-12-08 LAB — BASIC METABOLIC PANEL
Anion gap: 10 (ref 5–15)
BUN: 17 mg/dL (ref 8–23)
CO2: 21 mmol/L — ABNORMAL LOW (ref 22–32)
Calcium: 9.4 mg/dL (ref 8.9–10.3)
Chloride: 104 mmol/L (ref 98–111)
Creatinine, Ser: 0.82 mg/dL (ref 0.44–1.00)
GFR, Estimated: >60 mL/min (ref >60)
Glucose, Bld: 73 mg/dL (ref 70–99)
Potassium: 4.5 mmol/L (ref 3.5–5.1)
Sodium: 135 mmol/L (ref 135–145)

## 2020-12-08 LAB — CBC
HCT: 34.8 % — ABNORMAL LOW (ref 36.0–46.0)
Hemoglobin: 10.9 g/dL — ABNORMAL LOW (ref 12.0–15.0)
MCH: 30.2 pg (ref 26.0–34.0)
MCHC: 31.3 g/dL (ref 30.0–36.0)
MCV: 96.4 fL (ref 80.0–100.0)
Platelets: 295 10*3/uL (ref 150–400)
RBC: 3.61 MIL/uL — ABNORMAL LOW (ref 3.87–5.11)
RDW: 13.3 % (ref 11.5–15.5)
WBC: 10.5 10*3/uL (ref 4.0–10.5)
nRBC: 0 % (ref 0.0–0.2)

## 2020-12-08 MED ORDER — METOPROLOL SUCCINATE ER 25 MG PO TB24
12.5000 mg | ORAL_TABLET | Freq: Two times a day (BID) | ORAL | Status: DC
  Filled 2020-12-08: qty 0.5

## 2020-12-08 MED ORDER — PREDNISOLONE ACETATE 1 % OP SUSP
1.0000 [drp] | Freq: Every day | OPHTHALMIC | Status: DC
  Administered 2020-12-08 – 2020-12-10 (×3): 1 [drp] via OPHTHALMIC
  Filled 2020-12-08: qty 5

## 2020-12-08 MED ORDER — OLOPATADINE HCL 0.1 % OP SOLN: 1.0000 [drp] | Freq: Two times a day (BID) | OPHTHALMIC | Status: DC | PRN

## 2020-12-08 MED ORDER — ASPIRIN EC 81 MG PO TBEC
81.0000 mg | DELAYED_RELEASE_TABLET | Freq: Every day | ORAL | Status: DC
  Administered 2020-12-08 – 2020-12-11 (×4): 81 mg via ORAL
  Filled 2020-12-08 (×4): qty 1

## 2020-12-08 MED ORDER — FOLIC ACID 1 MG PO TABS
1.0000 mg | ORAL_TABLET | Freq: Every evening | ORAL | Status: DC
  Administered 2020-12-08 – 2020-12-10 (×2): 1 mg via ORAL
  Filled 2020-12-08 (×2): qty 1

## 2020-12-08 MED ORDER — METOPROLOL SUCCINATE ER 25 MG PO TB24
12.5000 mg | ORAL_TABLET | Freq: Two times a day (BID) | ORAL | Status: DC
  Administered 2020-12-08 – 2020-12-11 (×7): 12.5 mg via ORAL
  Filled 2020-12-08: qty 0.5
  Filled 2020-12-08 (×8): qty 1

## 2020-12-08 MED ORDER — AMLODIPINE BESYLATE 5 MG PO TABS
5.0000 mg | ORAL_TABLET | Freq: Every day | ORAL | Status: DC
  Administered 2020-12-08 – 2020-12-11 (×4): 5 mg via ORAL
  Filled 2020-12-08 (×4): qty 1

## 2020-12-08 MED ORDER — DARIFENACIN HYDROBROMIDE ER 7.5 MG PO TB24
7.5000 mg | ORAL_TABLET | Freq: Every day | ORAL | Status: DC
  Administered 2020-12-08 – 2020-12-11 (×4): 7.5 mg via ORAL
  Filled 2020-12-08 (×5): qty 1

## 2020-12-08 MED ORDER — BUSPIRONE HCL 5 MG PO TABS
7.5000 mg | ORAL_TABLET | Freq: Two times a day (BID) | ORAL | Status: DC
  Administered 2020-12-08 – 2020-12-11 (×7): 7.5 mg via ORAL
  Filled 2020-12-08 (×4): qty 2
  Filled 2020-12-08: qty 1.5
  Filled 2020-12-08 (×3): qty 2

## 2020-12-08 MED ORDER — SODIUM CHLORIDE (HYPERTONIC) 2 % OP SOLN: 1.0000 [drp] | OPHTHALMIC | Status: DC | PRN

## 2020-12-08 NOTE — Consult Note (Addendum)
WOC Nurse Consult Note: Patient receiving care in [facility] ED 7. Reason for Consult: "pressure sores, present on admisstion" Wound type: Severe MASD-IAD to labia, perineal area, buttocks, sacrum. The entire area is bright red and heavily covered by many clusters of tan mounds of fungus.  Erythema to RLE from just distal to the knee to the ankle. There is a dark purple area in the medial, mid-tibia area with intact overlying tissue.  The patient cannot tell me when or how this happened.  It is not consistent with a PI site. More likely related to trauma from a fall.  No need for a dressing at this time--just ongoing monitoring. Pressure Injury POA: Yes/No/NA Measurement: Wound bed: Drainage (amount, consistency, odor)  Periwound: Dressing procedure/placement/frequency: Wash the patient's labia, buttocks, perineal area with soap and water. Pat dry. Apply Triple paste and antifungal powder (green and white bottle in clean utility).  I have placed an order for Prevalon boots to the feet while the patient is in bed.  It may not be possible to keep them on as she is removing her watches, IV, bedside monitoring equipment, and PureWick.  And, for the RLE: Monitor the darkened area to the mid-tibial site of the RLE.  Do NOT put a foam dressing on this--it can cause the skin to peel when removed.  JUST MONITOR THE AREA, and report to the MD if it appears to be worsening.  WOC nurse will not follow at this time.  Please re-consult the WOC team if needed.  Helmut Muster, RN, MSN, CWOCN, CNS-BC, pager [phone]

## 2020-12-08 NOTE — ED Notes (Signed)
Offered to do blood work and re-insert iv again and patient refused until talking to daughter

## 2020-12-08 NOTE — ED Notes (Signed)
Called stella(daughter) to make aware that patient is refusing blood work and IV. Patient talked to daughter and agreed to blood work and IV

## 2020-12-08 NOTE — Progress Notes (Signed)
Bedside swallow evaluation done with water. After sitting HOB up, patient was able to tolerate drinking from the cup without any problems. Pills were given and patient swallowed them without difficulty. After drinking a few more sips, patient began to cough. MD made aware and SLP consulted. Remains NPO at this time. Will continue to monitor.

## 2020-12-08 NOTE — TOC Initial Note (Signed)
Transition of Care [facility]) - Initial/Assessment Note   Patient Details  Name: Lisa Freeman MRN: [id number] Date of Birth: [birth year]  Transition of [facility]) CM/SW Contact:    Ewing Schlein, LCSW Phone Number: 12/08/2020, 3:39 PM  Clinical Narrative: Patient is from home with her daughter, Noreene Larsson. CSW spoke with daughter to complete assessment as patient has dementia and is oriented x1. Per daughter, patient is "mean" and "is hitting me with her walker" as a result of her dementia. Daughter stated she cannot care for the patient. CSW explained that if PT recommends SNF, the patient can go to short-term rehab. Daughter stated she thought the patient has 180 days of rehab. CSW explained that the first 20 days are covered by Medicare and then patient will be in copay days. CSW explained that if SNF is not recommended, patient would be private pay for a facility which is several thousand dollars per month. Per daughter, the family cannot afford private pay. CSW encouraged daughter to apply for Medicaid for the patient. TOC awaiting PT recommendations.  Expected Discharge Plan: Skilled Nursing Facility Barriers to Discharge: Continued Medical Work up  Patient Goals and CMS Choice Patient states their goals for this hospitalization and ongoing recovery are:: Go to a facility CMS Medicare.gov Compare Post Acute Care list provided to:: Patient Represenative (must comment) Choice offered to / list presented to : Adult Children  Expected Discharge Plan and Services Expected Discharge Plan: Skilled Nursing Facility In-house Referral: Clinical Social Work Post Acute Care Choice: Skilled Nursing Facility Living arrangements for the past 2 months: Single Family Home             DME Arranged: N/A DME Agency: NA  Prior Living Arrangements/Services Living arrangements for the past 2 months: Single Family Home Lives with:: Adult Children Patient language and need for interpreter reviewed::  Yes Do you feel safe going back to the place where you live?: Yes      Need for Family Participation in Patient Care: Yes (Comment) (Patient has dementia.) Care giver support system in place?: Yes (comment) Criminal Activity/Legal Involvement Pertinent to Current Situation/Hospitalization: No - Comment as needed  Activities of Daily Living Home Assistive Devices/Equipment: Environmental consultant (specify type), Wheelchair, Dentures (specify type), Eyeglasses, Cane (specify quad or straight) (upper/lower dentures, single point cane) ADL Screening (condition at time of admission) Patient's cognitive ability adequate to safely complete daily activities?: No (worsening ams x 3 days) Is the patient deaf or have difficulty hearing?: Yes (very HOH) Does the patient have difficulty seeing, even when wearing glasses/contacts?: No Does the patient have difficulty concentrating, remembering, or making decisions?: Yes Patient able to express need for assistance with ADLs?: No Does the patient have difficulty dressing or bathing?: Yes Independently performs ADLs?: No Communication: Independent Dressing (OT): Needs assistance Is this a change from baseline?: Pre-admission baseline Grooming: Needs assistance Is this a change from baseline?: Pre-admission baseline Feeding: Needs assistance Is this a change from baseline?: Pre-admission baseline Bathing: Needs assistance Is this a change from baseline?: Pre-admission baseline Toileting: Needs assistance Is this a change from baseline?: Pre-admission baseline In/Out Bed: Needs assistance Is this a change from baseline?: Pre-admission baseline Walks in Home: Needs assistance Is this a change from baseline?: Pre-admission baseline Does the patient have difficulty walking or climbing stairs?: Yes (secondary to weakness-does not do stairs) Weakness of Legs: Both Weakness of Arms/Hands: Both  Permission Sought/Granted Permission sought to share information with :  Facility Medical sales representative Permission granted to share information with :  Yes, Verbal Permission Granted Permission granted to share info w AGENCY: SNFs  Emotional Assessment Appearance:: Appears stated age Attitude/Demeanor/Rapport: Unable to Assess Affect (typically observed): Unable to Assess Orientation: : Oriented to Self Alcohol / Substance Use: Not Applicable  Admission diagnosis:  Acute cystitis with hematuria [N30.01] Urinary tract infection without hematuria, site unspecified [N39.0] Patient Active Problem List   Diagnosis Date Noted   Acute cystitis with hematuria 12/07/2020   Acute metabolic encephalopathy 12/07/2020   Dementia, Alzheimer's, with behavior disturbance (HCC) 12/07/2020   Sacral pressure sore 12/07/2020   Colitis 02/19/2020   Acute colitis 02/18/2020   Pneumonia of right middle lobe due to infectious organism    Acute UTI 01/12/2019   AKI (acute kidney injury) (HCC) 01/12/2019   Anxiety 01/12/2019   Diarrhea 01/12/2019   Abdominal pain 01/11/2019   Colitis presumed infectious 12/27/2018   Anemia    Chronic anemia    Insomnia    Subclinical hyperthyroidism    Anxiety state, unspecified    Atrial fibrillation (HCC)    Carotid stenosis    Cerebral artery occlusion    Diverticulosis of colon    Dyshidrosis    Other and unspecified hyperlipidemia    Migraine    Rosacea    Unspecified venous (peripheral) insufficiency    Urinary tract infection, site not specified 11/10/2013   Urinary tract obstruction due to kidney stone 10/25/2012   Hypertension    PCP:  Bailey Mech, PA-C Pharmacy:   [id number] [place], [place] - [address] [place] [address] Phone: [phone] Fax: [phone]  Readmission Risk Interventions No flowsheet data found.

## 2020-12-08 NOTE — Evaluation (Signed)
Clinical/Bedside Swallow Evaluation Patient Details  Name: Lisa Freeman MRN: [id number] Date of Birth: June 27, 1918  Today's Date: 12/08/2020 Time: SLP Start Time (ACUTE ONLY): 1545 SLP Stop Time (ACUTE ONLY): 1620 SLP Time Calculation (min) (ACUTE ONLY): 35 min  Past Medical History:  Past Medical History:  Diagnosis Date   Cornea transplant recipient    Right eye.   Dementia (HCC)    Hypertension    Past Surgical History:  Past Surgical History:  Procedure Laterality Date   ABDOMINAL HYSTERECTOMY     APPENDECTOMY     BACK SURGERY     CYSTOSCOPY W/ RETROGRADES Left 11/06/2012   Procedure: CYSTOSCOPY WITH RETROGRADE PYELOGRAM;  Surgeon: Lindaann Slough, MD;  Location: [facility];  Service: Urology;  Laterality: Left;   CYSTOSCOPY W/ URETERAL STENT REMOVAL Left 11/06/2012   Procedure: CYSTOSCOPY WITH STENT REMOVAL;  Surgeon: Lindaann Slough, MD;  Location: [facility] [place];  Service: Urology;  Laterality: Left;   CYSTOSCOPY WITH RETROGRADE PYELOGRAM, URETEROSCOPY AND STENT PLACEMENT Left 10/25/2012   Procedure: CYSTOSCOPY/LEFT RETROGRADE PYELOGRAM/PLACEMENT LEFT URETERAL STENT;  Surgeon: Lindaann Slough, MD;  Location: [facility];  Service: Urology;  Laterality: Left;  CYSTOSCOPY/LEFT RETROGRADE PYELOGRAM/URETEROSCOPY/PLACEMENT URETERAL STENT   CYSTOSCOPY WITH STENT PLACEMENT Left 11/06/2012   Procedure: CYSTOSCOPY WITH STENT PLACEMENT;  Surgeon: Lindaann Slough, MD;  Location: [facility] [place];  Service: Urology;  Laterality: Left;   CYSTOSCOPY WITH URETEROSCOPY AND STENT PLACEMENT Left 11/06/2012   Procedure: CYSTOSCOPY WITH URETEROSCOPY, STONE MANIPULATION;  Surgeon: Lindaann Slough, MD;  Location: [facility];  Service: Urology;  Laterality: Left;   HIP FRACTURE SURGERY Left    HPI:  Lisa Freeman is an 87 y.o. female with medical history significant for hypertension, dementia, who presents to the emergency department on 12/07/2020  with altered mental status and hematuria.  Pt found to have mounds of fungus with redness at labia.  Pt did not pass yale swallow screen - due to coughing with intake.  Pt endorses h/o dysphagia requiring her to take "small bites/sips and chew well" as she confirms h/o "choking" easily over the last year.  Pt with negative CXR.  CT soft tissue of neck 08/11/2019 for "sore throat" showed Nonspecific effacement of the right lateral oropharynx and vallecula.  Multilevel cervical spine degenerative changes. Heterogeneous and enlarged thyroid without recommendation for work up given pt's advanced age and multiple comorbidities.  C4=C5 degenerative spine noted on imaging in 2012.  Patient was started empirically on IV ceftriaxone for UTI per MD notes.   Assessment / Plan / Recommendation Clinical Impression  Pt fully alert, able to feed self, ill fitting dentures (no adhesive present) but able to masticate soft solids.  No focal CN deficits apparent and pt admits to h/o "choking" so she knows to eat "take small bites, chew well and eat slowly".  Pt observed with water, nectar thick juice, sandwich, applesauce and graham crackers.  NO Indication of aspiration with all po intake with pt slowly self feeding.  Recommend dys3/thin with precautions.  She does have h/o cervical spine DDD most notably at C4-C5 that may negatively impact pharyngeal clearance, thus recommend large pills be crushed or cut in half and all medications be given with puree.  Will follow up x1 for family education for dysphagia mitigation strategies as daughter not present. SLP Visit Diagnosis: Dysphagia, unspecified (R13.10)    Aspiration Risk  Mild aspiration risk    Diet Recommendation Dysphagia 3 (Mech soft);Thin liquid   Liquid Administration via: Cup;Straw  Medication Administration: Whole meds with puree Supervision: Patient able to self feed Compensations: Slow rate;Small sips/bites Postural Changes: Seated upright at 90  degrees;Remain upright for at least 30 minutes after po intake    Other  Recommendations Oral Care Recommendations: Oral care BID   Follow up Recommendations        Frequency and Duration min 1 x/week  1 week       Prognosis Prognosis for Safe Diet Advancement: Fair      Swallow Study   General HPI: Lisa Freeman is an 87 y.o. female with medical history significant for hypertension, dementia, who presents to the emergency department on 12/07/2020 with altered mental status and hematuria.  Pt found to have mounds of fungus with redness at labia.  Pt did not pass yale swallow screen - due to coughing with intake.  Pt endorses h/o dysphagia requiring her to take "small bites/sips and chew well" as she confirms h/o "choking" easily over the last year.  Pt with negative CXR.  CT soft tissue of neck 08/11/2019 for "sore throat" showed Nonspecific effacement of the right lateral oropharynx and vallecula.  Multilevel cervical spine degenerative changes. Heterogeneous and enlarged thyroid without recommendation for work up given pt's advanced age and multiple comorbidities.  C4=C5 degenerative spine noted on imaging in 2012.  Patient was started empirically on IV ceftriaxone for UTI per MD notes. Type of Study: Bedside Swallow Evaluation Previous Swallow Assessment: BSE 02/2020 suspect esophageal dysphagia- recommend soft/thin Diet Prior to this Study: NPO Temperature Spikes Noted: No Respiratory Status: Room air History of Recent Intubation: No Behavior/Cognition: Alert;Cooperative;Other (Comment) (very HOH, mimics SLP some) Oral Cavity Assessment: Within Functional Limits Oral Care Completed by SLP: No Oral Cavity - Dentition: Dentures, top;Dentures, bottom (ill fitting and dentures are not present) Vision: Functional for self-feeding Self-Feeding Abilities: Able to feed self Patient Positioning: Upright in bed Baseline Vocal Quality: Normal Volitional Cough: Cognitively unable to  elicit Volitional Swallow: Unable to elicit    Oral/Motor/Sensory Function Overall Oral Motor/Sensory Function: Generalized oral weakness   Ice Chips Ice chips: Not tested   Thin Liquid Thin Liquid: Within functional limits Presentation: Self Fed;Straw    Nectar Thick Nectar Thick Liquid: Within functional limits Presentation: Straw;Self Fed   Honey Thick Honey Thick Liquid: Not tested   Puree Puree: Within functional limits Presentation: Self Fed;Spoon   Solid     Solid: Impaired Presentation: Self Fed Oral Phase Impairments: Impaired mastication Oral Phase Functional Implications: Prolonged oral transit;Impaired mastication Other Comments: ill fitting dentures - prolonged mastication which is effective for pt      Chales Abrahams 12/08/2020,4:14 PM   Rolena Infante, MS [facility] SLP Acute Rehab Services Office [phone] Pager [phone]

## 2020-12-08 NOTE — Progress Notes (Signed)
Progress Note    Lisa Freeman  ZOX:[id number]:[id number] DOB: 10-09-1918  DOA: 12/07/2020 PCP: Lisa Freeman, Lisa Christopher, PA-C    Brief Narrative:     Medical records reviewed and are as summarized below:  Lisa Freeman is an 87 y.o. female with medical history significant for hypertension, dementia, who presents to the emergency department on 12/07/2020 with altered mental status.    Assessment/Plan:   Principal Problem:   Acute cystitis with hematuria Active Problems:   Hypertension   Anxiety   Acute metabolic encephalopathy   Dementia, Alzheimer's, with behavior disturbance (HCC)   Sacral pressure sore   Acute cystitis with hematuria -await Cultures -IV antibiotic.     Dementia, Alzheimer's, with behavior disturbance (HCC) -delirium precautions -treat UTI   Severe MASD-IAD to labia, perineal area, buttocks, sacrum. The entire area is bright red and heavily covered by many clusters of tan mounds of fungus.     Hypertension -resume home meds and add PRN meds     Anxiety -outpatient follow up   Await bedside nursing swallow eval-- will add diet when passes  Family Communication/Anticipated D/C date and plan/Code Status   DVT prophylaxis: Lovenox ordered. Code Status: dnr  Disposition Plan: Status is: Observation  The patient will require care spanning > 2 midnights and should be moved to inpatient because: Inpatient level of care appropriate due to severity of illness  Dispo: The patient is from: Home              Anticipated d/c is to:  tbd              Patient currently is not medically stable to d/c.   Difficult to place patient No         Medical Consultants:   None.    Subjective:   Hard of hearing-- denies issues currently  Objective:    Vitals:   12/08/20 0200 12/08/20 0215 12/08/20 0247 12/08/20 0555  BP: (!) 158/62 (!) 164/62 (!) 184/71 (!) 168/74  Pulse: 73 79 93 95  Resp: 16 18 18 20   Temp:      TempSrc:      SpO2: 94% 96%  100% 98%  Weight:      Height:        Intake/Output Summary (Last 24 hours) at 12/08/2020 0836 Last data filed at 12/07/2020 1258 Gross per 24 hour  Intake 100 ml  Output --  Net 100 ml   Filed Weights   12/07/20 0914  Weight: 43.2 kg    Exam:  General: Appearance:    Thin female in no acute distress     Lungs:     respirations unlabored  Heart:    Normal heart rate.  MS:   All extremities are intact.    Neurologic:   Awake, very hard of hearing-- cooperative     Data Reviewed:   I have personally reviewed following labs and imaging studies:  Labs: Labs show the following:   Basic Metabolic Panel: Recent Labs  Lab 12/07/20 0949 12/08/20 0630  NA 133* 135  K 4.1 4.5  CL 101 104  CO2 23 21*  GLUCOSE 112* 73  BUN 28* 17  CREATININE 1.07* 0.82  CALCIUM 9.1 9.4   GFR Estimated Creatinine Clearance: 25.5 mL/min (by C-G formula based on SCr of 0.82 mg/dL). Liver Function Tests: Recent Labs  Lab 12/07/20 0949  AST 22  ALT 20  ALKPHOS 78  BILITOT 0.5  PROT 7.3  ALBUMIN 3.7   No  results for input(s): LIPASE, AMYLASE in the last 168 hours. No results for input(s): AMMONIA in the last 168 hours. Coagulation profile No results for input(s): INR, PROTIME in the last 168 hours.  CBC: Recent Labs  Lab 12/07/20 0949 12/08/20 0630  WBC 10.6* 10.5  NEUTROABS 8.3*  --   HGB 10.0* 10.9*  HCT 32.3* 34.8*  MCV 97.9 96.4  PLT 294 295   Cardiac Enzymes: No results for input(s): CKTOTAL, CKMB, CKMBINDEX, TROPONINI in the last 168 hours. BNP (last 3 results) No results for input(s): PROBNP in the last 8760 hours. CBG: No results for input(s): GLUCAP in the last 168 hours. D-Dimer: No results for input(s): DDIMER in the last 72 hours. Hgb A1c: No results for input(s): HGBA1C in the last 72 hours. Lipid Profile: No results for input(s): CHOL, HDL, LDLCALC, TRIG, CHOLHDL, LDLDIRECT in the last 72 hours. Thyroid function studies: No results for input(s):  TSH, T4TOTAL, T3FREE, THYROIDAB in the last 72 hours.  Invalid input(s): FREET3 Anemia work up: No results for input(s): VITAMINB12, FOLATE, FERRITIN, TIBC, IRON, RETICCTPCT in the last 72 hours. Sepsis Labs: Recent Labs  Lab 12/07/20 0949 12/08/20 0630  WBC 10.6* 10.5  LATICACIDVEN 0.8  --     Microbiology Recent Results (from the past 240 hour(s))  Culture, blood (Routine X 2) w Reflex to ID Panel     Status: None (Preliminary result)   Collection Time: 12/07/20  9:49 AM   Specimen: BLOOD  Result Value Ref Range Status   Specimen Description   Final    BLOOD RIGHT ANTECUBITAL Performed at [facility], [address], [place], [place] [address]    Special Requests   Final    BOTTLES DRAWN AEROBIC AND ANAEROBIC Blood Culture results may not be optimal due to an excessive volume of blood received in culture bottles Performed at [facility], [address] [place], [place] [address]    Culture   Final    NO GROWTH < 24 HOURS Performed at [facility], [address] [place], [place] [address]    Report Status PENDING  Incomplete  Resp Panel by RT-PCR (Flu A&B, Covid) Nasopharyngeal Swab     Status: None   Collection Time: 12/07/20  9:55 AM   Specimen: Nasopharyngeal Swab; Nasopharyngeal(NP) swabs in vial transport medium  Result Value Ref Range Status   SARS Coronavirus 2 by RT PCR NEGATIVE NEGATIVE Final    Comment: (NOTE) SARS-CoV-2 target nucleic acids are NOT DETECTED.  The SARS-CoV-2 RNA is generally detectable in upper respiratory specimens during the acute phase of infection. The lowest concentration of SARS-CoV-2 viral copies this assay can detect is 138 copies/mL. A negative result does not preclude SARS-Cov-2 infection and should not be used as the sole basis for treatment or other patient management decisions. A negative result may occur with  improper specimen collection/handling, submission of specimen  other than nasopharyngeal swab, presence of viral mutation(s) within the areas targeted by this assay, and inadequate number of viral copies(<138 copies/mL). A negative result must be combined with clinical observations, patient history, and epidemiological information. The expected result is Negative.  Fact Sheet for Patients:  BloggerCourse.com  Fact Sheet for Healthcare Providers:  SeriousBroker.it  This test is no t yet approved or cleared by the [place] FDA and  has been authorized for detection and/or diagnosis of SARS-CoV-2 by FDA under an Emergency Use Authorization (EUA). This EUA will remain  in effect (meaning this test  can be used) for the duration of the COVID-19 declaration under Section 564(b)(1) of the Act, 21 U.S.C.section 360bbb-3(b)(1), unless the authorization is terminated  or revoked sooner.       Influenza A by PCR NEGATIVE NEGATIVE Final   Influenza B by PCR NEGATIVE NEGATIVE Final    Comment: (NOTE) The Xpert Xpress SARS-CoV-2/FLU/RSV plus assay is intended as an aid in the diagnosis of influenza from Nasopharyngeal swab specimens and should not be used as a sole basis for treatment. Nasal washings and aspirates are unacceptable for Xpert Xpress SARS-CoV-2/FLU/RSV testing.  Fact Sheet for Patients: BloggerCourse.com  Fact Sheet for Healthcare Providers: SeriousBroker.it  This test is not yet approved or cleared by the [place] FDA and has been authorized for detection and/or diagnosis of SARS-CoV-2 by FDA under an Emergency Use Authorization (EUA). This EUA will remain in effect (meaning this test can be used) for the duration of the COVID-19 declaration under Section 564(b)(1) of the Act, 21 U.S.C. section 360bbb-3(b)(1), unless the authorization is terminated or revoked.  Performed at [facility], [address], [place], [place] [address]     Procedures and diagnostic studies:  DG Chest Port 1 View  Result Date: 12/07/2020 CLINICAL DATA:  Altered mental status EXAM: PORTABLE CHEST 1 VIEW COMPARISON:  02/21/2020 FINDINGS: Cardiac shadow is stable. Aortic calcifications are noted. Tortuous thoracic aorta is noted. The lungs are clear bilaterally. No focal infiltrate or effusion is noted. No bony abnormality is noted. IMPRESSION: No active disease. Electronically Signed   By: Alcide Clever M.D.   On: 12/07/2020 10:36   VAS [place] LOWER EXTREMITY VENOUS (DVT) (ONLY [facility] & [facility] 7a-7p)  Result Date: 12/07/2020  Lower Venous DVT Study Patient Name:  JASZMINE NAVEJAS  Date of Exam:   12/07/2020 Medical Rec #: [id number]        Accession #:    [id number] Date of Birth: 1919/01/04       Patient Gender: F Patient Age:   87 years Exam Location:  [facility] Procedure:      VAS [place] LOWER EXTREMITY VENOUS (DVT) Referring Phys: Lorre Nick --------------------------------------------------------------------------------  Indications: Swelling.  Risk Factors: None identified. Limitations: Poor ultrasound/tissue interface and patient positioning, patient pain tolerance. Comparison Study: No prior studies. Performing Technologist: Chanda Busing RVT  Examination Guidelines: A complete evaluation includes B-mode imaging, spectral Doppler, color Doppler, and power Doppler as needed of all accessible portions of each vessel. Bilateral testing is considered an integral part of a complete examination. Limited examinations for reoccurring indications may be performed as noted. The reflux portion of the exam is performed with the patient in reverse Trendelenburg.  +---------+---------------+---------+-----------+----------+--------------+ RIGHT    CompressibilityPhasicitySpontaneityPropertiesThrombus Aging +---------+---------------+---------+-----------+----------+--------------+ CFV      Full           Yes      Yes                                  +---------+---------------+---------+-----------+----------+--------------+ SFJ      Full                                                        +---------+---------------+---------+-----------+----------+--------------+ FV Prox  Full                                                        +---------+---------------+---------+-----------+----------+--------------+  FV Mid   Full                                                        +---------+---------------+---------+-----------+----------+--------------+ FV DistalFull                                                        +---------+---------------+---------+-----------+----------+--------------+ PFV      Full                                                        +---------+---------------+---------+-----------+----------+--------------+ POP      Full           Yes      Yes                                 +---------+---------------+---------+-----------+----------+--------------+ PTV      Full                                                        +---------+---------------+---------+-----------+----------+--------------+ PERO     Full                                                        +---------+---------------+---------+-----------+----------+--------------+   +----+---------------+---------+-----------+----------+--------------+ LEFTCompressibilityPhasicitySpontaneityPropertiesThrombus Aging +----+---------------+---------+-----------+----------+--------------+ CFV Full           Yes      Yes                                 +----+---------------+---------+-----------+----------+--------------+     Summary: RIGHT: - There is no evidence of deep vein thrombosis in the lower extremity. However, portions of this examination were limited- see technologist comments above.  - No cystic structure found in the popliteal fossa.  LEFT: - No evidence of common femoral vein  obstruction.  *See table(s) above for measurements and observations. Electronically signed by Lemar Livings MD on 12/07/2020 at 5:33:00 PM.    Final     Medications:    amLODipine  5 mg Oral Daily   aspirin EC  81 mg Oral Daily   busPIRone  7.5 mg Oral BID   darifenacin  7.5 mg Oral Daily   enoxaparin (LOVENOX) injection  30 mg Subcutaneous Q24H   folic acid  1 mg Oral QPM   metoprolol succinate  12.5 mg Oral BID   prednisoLONE acetate  1 drop Right Eye QHS   Continuous Infusions:  cefTRIAXone (ROCEPHIN)  IV     lactated ringers 75 mL/hr at 12/07/20 2128     LOS: 0 days  Joseph Art  [facility]   How to contact the [facility] Attending or Consulting provider 7A - 7P or covering provider during after hours 7P -7A, for this patient?  Check the care team in [facility] and look for a) attending/consulting TRH provider listed and b) the [facility] team listed Log into [email or web address] and use [place]'s universal password to access. If you do not have the password, please contact the hospital operator. Locate the [facility] provider you are looking for under [facility] and page to a number that you can be directly reached. If you still have difficulty reaching the provider, please page the [facility] (Director on Call) for the Hospitalists listed on amion for assistance.  12/08/2020, 8:36 AM

## 2020-12-08 NOTE — Plan of Care (Signed)

## 2020-12-08 NOTE — Evaluation (Signed)
Physical Therapy Evaluation Patient Details Name: Lisa Freeman MRN: [id number] DOB: Jan 07, 1919 Today's Date: 12/08/2020   History of Present Illness  87 y.o. female with medical history significant for hypertension, dementia, who presents to the emergency department on 12/07/2020 with altered mental status and admitted for Acute cystitis with hematuria.  Pt also found to have severe MASD - IAD to labia, perineal area, buttocks, sacrum.  Clinical Impression  Pt admitted with above diagnosis.  Pt currently with functional limitations due to the deficits listed below (see PT Problem List). Pt will benefit from skilled PT to increase their independence and safety with mobility to allow discharge to the venue listed below.   Pt agreeable to mobilize however only able to transfer to/from [facility].  Pt requiring at least min assist for transfers at this time and presents as HIGH fall risk.  Recommend SNF upon d/c if daughter is unable to provide physical assist at home.     Follow Up Recommendations SNF    Equipment Recommendations  Rolling walker with 5" wheels;3in1 (PT)    Recommendations for Other Services       Precautions / Restrictions Precautions Precautions: Fall      Mobility  Bed Mobility Overal bed mobility: Needs Assistance Bed Mobility: Supine to Sit;Sit to Supine     Supine to sit: Min assist;HOB elevated Sit to supine: Min assist   General bed mobility comments: increased time and effort, assist for trunk upright and LEs onto bed    Transfers Overall transfer level: Needs assistance Equipment used: None Transfers: Squat Pivot Transfers     Squat pivot transfers: Min assist     General transfer comment: pt declined using RW, pt did not stand fully erect and required assist to complete transfers, assisted to/from [facility] however pt did not use  Ambulation/Gait                Stairs            Wheelchair Mobility    Modified Rankin (Stroke Patients  Only)       Balance                                             Pertinent Vitals/Pain Pain Assessment: Faces Faces Pain Scale: Hurts little more Pain Location: "down there" Pain Descriptors / Indicators: Burning Pain Intervention(s): Repositioned;Monitored during session    Home Living Family/patient expects to be discharged to:: Private residence Living Arrangements: Children;Other relatives (daughter and son-in-law) Available Help at Discharge: Family Type of Home: House       Home Layout: One level Home Equipment: Environmental consultant - 2 wheels Additional Comments: pt likes to read and crochet    Prior Function Level of Independence: Needs assistance   Gait / Transfers Assistance Needed: likely ambulatory  ADL's / Homemaking Assistance Needed: pt reports not requiring assist for ADLs however admitted with MASD -IAD in peri area  Comments: amb with RW at baseline per pt     Hand Dominance        Extremity/Trunk Assessment        Lower Extremity Assessment Lower Extremity Assessment: Generalized weakness    Cervical / Trunk Assessment Cervical / Trunk Assessment: Kyphotic  Communication   Communication: HOH  Cognition Arousal/Alertness: Awake/alert Behavior During Therapy: WFL for tasks assessed/performed Overall Cognitive Status: History of cognitive impairments - at baseline  General Comments: pleasant, able to start her name and knew she was in hospital however unable to state name of hospital      General Comments      Exercises     Assessment/Plan    PT Assessment Patient needs continued PT services  PT Problem List Decreased strength;Decreased mobility;Decreased activity tolerance;Decreased balance;Decreased knowledge of use of DME;Decreased skin integrity       PT Treatment Interventions DME instruction;Gait training;Balance training;Therapeutic exercise;Functional mobility  training;Therapeutic activities;Patient/family education    PT Goals (Current goals can be found in the Care Plan section)  Acute Rehab PT Goals PT Goal Formulation: Patient unable to participate in goal setting Time For Goal Achievement: 12/22/20 Potential to Achieve Goals: Good    Frequency Min 2X/week   Barriers to discharge        Co-evaluation               AM-PAC PT "6 Clicks" Mobility  Outcome Measure Help needed turning from your back to your side while in a flat bed without using bedrails?: A Little Help needed moving from lying on your back to sitting on the side of a flat bed without using bedrails?: A Little Help needed moving to and from a bed to a chair (including a wheelchair)?: A Lot Help needed standing up from a chair using your arms (e.g., wheelchair or bedside chair)?: A Lot Help needed to walk in hospital room?: Total Help needed climbing 3-5 steps with a railing? : Total 6 Click Score: 12    End of Session Equipment Utilized During Treatment: Gait belt Activity Tolerance: Patient tolerated treatment well Patient left: in bed;with call bell/phone within reach;with bed alarm set   PT Visit Diagnosis: Muscle weakness (generalized) (M62.81);Difficulty in walking, not elsewhere classified (R26.2)    Time: 3846-6599 PT Time Calculation (min) (ACUTE ONLY): 13 min   Charges:   PT Evaluation $PT Eval Low Complexity: 1 Low        Kati PT, DPT Acute Rehabilitation Services Pager: [phone] Office: [phone]   Maida Sale E 12/08/2020, 5:03 PM

## 2020-12-08 NOTE — Progress Notes (Signed)
Spoke with daughter, Francena Hanly, and updated given on patient. Stella to bring denture glue tomorrow when she visits patient. Francena Hanly also mentioned that patient likes to roam and rearrange her room at night, will notify oncoming shift to be aware. Patient sitting in bed currently waiting on dinner. Call bell in reach, bed alarm on, will continue to monitor.

## 2020-12-08 NOTE — ED Notes (Signed)
Pt pulled out IV and pulled off all vital sign equipment. Pt refused to let RN get blood work or IV without discussing with daughter. Left voicemail for daughter. Will attempt again

## 2020-12-09 LAB — URINE CULTURE: Culture: >=100000 — AB

## 2020-12-09 MED ORDER — CEFAZOLIN SODIUM-DEXTROSE 1-4 GM/50ML-% IV SOLN
1.0000 g | Freq: Three times a day (TID) | INTRAVENOUS | Status: AC
  Administered 2020-12-10 (×3): 1 g via INTRAVENOUS
  Filled 2020-12-09 (×3): qty 50

## 2020-12-09 NOTE — [place] FL2 (Signed)
[place] MEDICAID FL2 LEVEL OF CARE SCREENING TOOL     IDENTIFICATION  Patient Name: Lisa Freeman Birthdate: 03/24/1919 Sex: female Admission Date (Current Location): 12/07/2020  [facility] and [place] Number:  Producer, television/film/video and Address:  [facility],  [address], [place] [address]      Provider Number: [id number]  Attending Physician Name and Address:  Joseph Art, DO  Relative Name and Phone Number:  Noreene Larsson (daughter) Ph: [phone]    Current Level of Care: Hospital Recommended Level of Care: Skilled Nursing Facility Prior Approval Number:    Date Approved/Denied:   PASRR Number: [id number]  Discharge Plan: SNF    Current Diagnoses: Patient Active Problem List   Diagnosis Date Noted   Acute cystitis with hematuria 12/07/2020   Acute metabolic encephalopathy 12/07/2020   Dementia, Alzheimer's, with behavior disturbance (HCC) 12/07/2020   Sacral pressure sore 12/07/2020   Colitis 02/19/2020   Acute colitis 02/18/2020   Pneumonia of right middle lobe due to infectious organism    Acute UTI 01/12/2019   AKI (acute kidney injury) (HCC) 01/12/2019   Anxiety 01/12/2019   Diarrhea 01/12/2019   Abdominal pain 01/11/2019   Colitis presumed infectious 12/27/2018   Anemia    Chronic anemia    Insomnia    Subclinical hyperthyroidism    Anxiety state, unspecified    Atrial fibrillation (HCC)    Carotid stenosis    Cerebral artery occlusion    Diverticulosis of colon    Dyshidrosis    Other and unspecified hyperlipidemia    Migraine    Rosacea    Unspecified venous (peripheral) insufficiency    Urinary tract infection, site not specified 11/10/2013   Urinary tract obstruction due to kidney stone 10/25/2012   Hypertension     Orientation RESPIRATION BLADDER Height & Weight     Self  Normal Incontinent Weight: 100 lb (45.4 kg) Height:  5' (152.4 cm)  BEHAVIORAL SYMPTOMS/MOOD NEUROLOGICAL BOWEL NUTRITION STATUS       Continent Diet (Dysphagia diet)  AMBULATORY STATUS COMMUNICATION OF NEEDS Skin   Limited Assist Verbally Other (Comment) (Ecchymosis: face, right arm, left leg; cellulitis: right leg; rash/contact dermatitis: labia)                       Personal Care Assistance Level of Assistance  Bathing, Feeding, Dressing Bathing Assistance: Limited assistance Feeding assistance: Independent Dressing Assistance: Limited assistance     Functional Limitations Info  Sight, Hearing, Speech Sight Info: Impaired Hearing Info: Impaired Speech Info: Adequate    SPECIAL CARE FACTORS FREQUENCY  PT (By licensed PT), OT (By licensed OT)     PT Frequency: 5x's/week OT Frequency: 5x's/week            Contractures Contractures Info: Not present    Additional Factors Info  Code Status, Psychotropic, Allergies Code Status Info: DNR Allergies Info: Doxycycline, Fluticasone Propionate, Moxifloxacin, Nitrofurantoin Monohyd Macro, Penicillins, Promethazine, Sulfa Antibiotics, Trimethoprim, Codeine Psychotropic Info: Buspar         Current Medications (12/09/2020):  This is the current hospital active medication list Current Facility-Administered Medications  Medication Dose Route Frequency Provider Last Rate Last Admin   acetaminophen (TYLENOL) tablet 650 mg  650 mg Oral Q6H PRN Marlow Baars, MD   650 mg at 12/08/20 1834   Or   acetaminophen (TYLENOL) suppository 650 mg  650 mg Rectal Q6H PRN Marlow Baars, MD       amLODipine (NORVASC) tablet 5 mg  5 mg Oral Daily Marlow Baars, MD   5 mg at 12/08/20 1043   aspirin EC tablet 81 mg  81 mg Oral Daily Marlow Baars, MD   81 mg at 12/08/20 1043   bisacodyl (DULCOLAX) EC tablet 5 mg  5 mg Oral Daily PRN Marlow Baars, MD       busPIRone (BUSPAR) tablet 7.5 mg  7.5 mg Oral BID Marlow Baars, MD   7.5 mg at 12/08/20 2229   cefTRIAXone (ROCEPHIN) 1 g in sodium chloride 0.9 % 100 mL IVPB  1 g Intravenous Daily Marlow Baars, MD 200 mL/hr  at 12/09/20 1019 1 g at 12/09/20 1019   darifenacin (ENABLEX) 24 hr tablet 7.5 mg  7.5 mg Oral Daily Marlow Baars, MD   7.5 mg at 12/08/20 1817   enoxaparin (LOVENOX) injection 30 mg  30 mg Subcutaneous Q24H Marlow Baars, MD   30 mg at 12/08/20 2229   folic acid (FOLVITE) tablet 1 mg  1 mg Oral QPM Marlow Baars, MD   1 mg at 12/08/20 1817   HYDROcodone-acetaminophen (NORCO/VICODIN) 5-325 MG per tablet 1-2 tablet  1-2 tablet Oral Q4H PRN Marlow Baars, MD   2 tablet at 12/08/20 1937   metoprolol succinate (TOPROL-XL) 24 hr tablet 12.5 mg  12.5 mg Oral BID Marlow Baars, MD   12.5 mg at 12/08/20 2228   metoprolol tartrate (LOPRESSOR) injection 5 mg  5 mg Intravenous Q6H PRN Marlow Baars, MD       olopatadine (PATANOL) 0.1 % ophthalmic solution 1 drop  1 drop Right Eye BID PRN Marlow Baars, MD       ondansetron [facility]) tablet 4 mg  4 mg Oral Q6H PRN Marlow Baars, MD       Or   ondansetron (ZOFRAN) injection 4 mg  4 mg Intravenous Q6H PRN Marlow Baars, MD       prednisoLONE acetate (PRED FORTE) 1 % ophthalmic suspension 1 drop  1 drop Right Eye QHS Marlow Baars, MD   1 drop at 12/08/20 2230   senna-docusate (Senokot-S) tablet 1 tablet  1 tablet Oral QHS PRN Marlow Baars, MD       sodium chloride (MURO 128) 2 % ophthalmic solution 1 drop  1 drop Both Eyes Q4H PRN Marlow Baars, MD         Discharge Medications: Please see discharge summary for a list of discharge medications.  Relevant Imaging Results:  Relevant Lab Results:   Additional Information SSN: [id number]  Ewing Schlein, LCSW

## 2020-12-09 NOTE — TOC Progression Note (Signed)
Transition of Care [facility]) - Progression Note   Patient Details  Name: Lisa Freeman MRN: [id number] Date of Birth: 1919-03-17  Transition of Care [facility]) CM/SW Contact  Ewing Schlein, LCSW Phone Number: 12/09/2020, 10:55 AM  Clinical Narrative: PT recommended SNF and daughter is agreeable to patient being faxed out. Daughter's first choice is [facility]. Patient is vaccinated and boosted x1 for COVID.  FL2 done; PASRR verified. Initial referral faxed out. TOC awaiting bed offers.  Expected Discharge Plan: Skilled Nursing Facility Barriers to Discharge: Continued Medical Work up  Expected Discharge Plan and Services Expected Discharge Plan: Skilled Nursing Facility In-house Referral: Clinical Social Work Post Acute Care Choice: Skilled Nursing Facility Living arrangements for the past 2 months: Single Family Home             DME Arranged: N/A DME Agency: NA  Readmission Risk Interventions No flowsheet data found.

## 2020-12-09 NOTE — Progress Notes (Addendum)
Progress Note    Lisa Freeman  ZOX:[id number] DOB: 06-23-1918  DOA: 12/07/2020 PCP: Bailey Mech, PA-C    Brief Narrative:     Medical records reviewed and are as summarized below:  Lisa Freeman is an 87 y.o. female with medical history significant for hypertension, dementia, who presents to the emergency department on 12/07/2020 with altered mental status.  Found to have a UTI.  Plan for SNF placement.    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 The entire area is bright red and heavily covered by many clusters of tan mounds of fungus.  -topical treatment    Hypertension -resume home meds and add PRN meds     Anxiety -outpatient follow up    Family Communication/Anticipated D/C date and plan/Code Status   DVT prophylaxis: Lovenox ordered. Code Status: dnr  Disposition Plan: Status is inpt Spoke with daughter on the phone  The patient will require care spanning > 2 midnights and should be moved to inpatient because: Inpatient level of care appropriate due to severity of illness  Dispo: The patient is from: Home              Anticipated d/c is to: SNF   Difficult to place patient No     Medical Consultants:   None.    Subjective:   Says she is feeling better  Objective:    Vitals:   12/08/20 1435 12/08/20 1724 12/08/20 2117 12/09/20 0521  BP: (!) 155/58 (!) 133/57 (!) 133/50 (!) 145/55  Pulse: 79 87 80 63  Resp: Temp:   99.3 F (37.4 C) 98.7 F (37.1 C)  TempSrc:   Oral Oral  SpO2: 97% 98%  95% 96%  Weight:      Height:        Intake/Output Summary (Last 24 hours) at 12/09/2020 0849 Last data filed at 12/09/2020 0650 Gross per 24 hour  Intake 2163.8 ml  Output 650 ml  Net 1513.8 ml   Filed Weights   12/07/20 0914 12/08/20 0835  Weight: 43.2 kg 45.4 kg    Exam:  General: Appearance:    Thin female in no acute distress     Lungs:     respirations unlabored  Heart:    Normal heart rate.   MS:   All extremities are intact.    Neurologic:   Awake, alert, pleasant- hard of hearing       Data Reviewed:   I have personally reviewed following labs and imaging studies:  Labs: Labs show the following:   Basic Metabolic Panel: Recent Labs  Lab 12/07/20 0949 12/08/20 0630  NA 133* 135  K 4.1 4.5  CL 101 104  CO2 23 21*  GLUCOSE 112* 73  BUN 28* 17  CREATININE 1.07* 0.82  CALCIUM 9.1 9.4   GFR Estimated Creatinine Clearance: 26.8 mL/min (by C-G formula based on SCr of 0.82 mg/dL). Liver Function Tests: Recent Labs  Lab 12/07/20 0949  AST 22  ALT 20  ALKPHOS 78  BILITOT 0.5  PROT 7.3  ALBUMIN 3.7  No results for input(s): LIPASE, AMYLASE in the last 168 hours. No results for input(s): AMMONIA in the last 168 hours. Coagulation profile No results for input(s): INR, PROTIME in the last 168 hours.  CBC: Recent Labs  Lab 12/07/20 0949 12/08/20 0630  WBC 10.6* 10.5  NEUTROABS 8.3*  --   HGB 10.0* 10.9*  HCT 32.3* 34.8*  MCV 97.9 96.4  PLT 294 295   Cardiac Enzymes: No results for input(s): CKTOTAL, CKMB, CKMBINDEX, TROPONINI in the last 168 hours. BNP (last 3 results) No results for input(s): PROBNP in the last 8760 hours. CBG: No results for input(s): GLUCAP in the last 168 hours. D-Dimer: No results for input(s): DDIMER in the last 72 hours. Hgb A1c: No results for input(s): HGBA1C in the last 72 hours. Lipid Profile: No results for input(s): CHOL, HDL, LDLCALC, TRIG, CHOLHDL, LDLDIRECT in the last 72 hours. Thyroid function  studies: No results for input(s): TSH, T4TOTAL, T3FREE, THYROIDAB in the last 72 hours.  Invalid input(s): FREET3 Anemia work up: No results for input(s): VITAMINB12, FOLATE, FERRITIN, TIBC, IRON, RETICCTPCT in the last 72 hours. Sepsis Labs: Recent Labs  Lab 12/07/20 0949 12/08/20 0630  WBC 10.6* 10.5  LATICACIDVEN 0.8  --     Microbiology Recent Results (from the past 240 hour(s))  Urine Culture     Status: Abnormal   Collection Time: 12/07/20  9:49 AM   Specimen: Urine, Clean Catch  Result Value Ref Range Status   Specimen Description   Final    URINE, CLEAN CATCH Performed at [facility], [address] [place], [place] [address]    Special Requests   Final    NONE Performed at [facility], [address] [place], [place] [address]    Culture >=100,000 COLONIES/mL ESCHERICHIA COLI (A)  Final   Report Status 12/09/2020 FINAL  Final   Organism ID, Bacteria ESCHERICHIA COLI (A)  Final      Susceptibility   Escherichia coli - MIC*    AMPICILLIN 8 SENSITIVE Sensitive     CEFAZOLIN <=4 SENSITIVE Sensitive     CEFEPIME <=0.12 SENSITIVE Sensitive     CEFTRIAXONE <=0.25 SENSITIVE Sensitive     CIPROFLOXACIN >=4 RESISTANT Resistant     GENTAMICIN <=1 SENSITIVE Sensitive     IMIPENEM <=0.25 SENSITIVE Sensitive     NITROFURANTOIN <=16 SENSITIVE Sensitive     TRIMETH/SULFA <=20 SENSITIVE Sensitive     AMPICILLIN/SULBACTAM 4 SENSITIVE Sensitive     PIP/TAZO <=4 SENSITIVE Sensitive     * >=100,000 COLONIES/mL ESCHERICHIA COLI  Culture, blood (Routine X 2) w Reflex to ID Panel     Status: None (Preliminary result)   Collection Time: 12/07/20  9:49 AM   Specimen: BLOOD  Result Value Ref Range Status   Specimen Description   Final    BLOOD RIGHT ANTECUBITAL Performed at [facility], [address] [place], [place] [address]    Special Requests   Final    BOTTLES DRAWN AEROBIC AND ANAEROBIC Blood Culture results  may not be optimal due to an excessive volume of blood received in culture bottles Performed at [facility], [address], [place], [place] [address]    Culture   Final    NO GROWTH < 24 HOURS Performed at [facility], [address], [place], [place] [address]    Report Status PENDING  Incomplete  Resp Panel by RT-PCR (Flu A&B, Covid) Nasopharyngeal Swab  Status: None   Collection Time: 12/07/20  9:55 AM   Specimen: Nasopharyngeal Swab; Nasopharyngeal(NP) swabs in vial transport medium  Result Value Ref Range Status   SARS Coronavirus 2 by RT PCR NEGATIVE NEGATIVE Final    Comment: (NOTE) SARS-CoV-2 target nucleic acids are NOT DETECTED.  The SARS-CoV-2 RNA is generally detectable in upper respiratory specimens during the acute phase of infection. The lowest concentration of SARS-CoV-2 viral copies this assay can detect is 138 copies/mL. A negative result does not preclude SARS-Cov-2 infection and should not be used as the sole basis for treatment or other patient management decisions. A negative result may occur with  improper specimen collection/handling, submission of specimen other than nasopharyngeal swab, presence of viral mutation(s) within the areas targeted by this assay, and inadequate number of viral copies(<138 copies/mL). A negative result must be combined with clinical observations, patient history, and epidemiological information. The expected result is Negative.  Fact Sheet for Patients:  BloggerCourse.com  Fact Sheet for Healthcare Providers:  SeriousBroker.it  This test is no t yet approved or cleared by the [place] FDA and  has been authorized for detection and/or diagnosis of SARS-CoV-2 by FDA under an Emergency Use Authorization (EUA). This EUA will remain  in effect (meaning this test can be used) for the duration of the COVID-19 declaration under Section 564(b)(1) of  the Act, 21 U.S.C.section 360bbb-3(b)(1), unless the authorization is terminated  or revoked sooner.       Influenza A by PCR NEGATIVE NEGATIVE Final   Influenza B by PCR NEGATIVE NEGATIVE Final    Comment: (NOTE) The Xpert Xpress SARS-CoV-2/FLU/RSV plus assay is intended as an aid in the diagnosis of influenza from Nasopharyngeal swab specimens and should not be used as a sole basis for treatment. Nasal washings and aspirates are unacceptable for Xpert Xpress SARS-CoV-2/FLU/RSV testing.  Fact Sheet for Patients: BloggerCourse.com  Fact Sheet for Healthcare Providers: SeriousBroker.it  This test is not yet approved or cleared by the [place] FDA and has been authorized for detection and/or diagnosis of SARS-CoV-2 by FDA under an Emergency Use Authorization (EUA). This EUA will remain in effect (meaning this test can be used) for the duration of the COVID-19 declaration under Section 564(b)(1) of the Act, 21 U.S.C. section 360bbb-3(b)(1), unless the authorization is terminated or revoked.  Performed at [facility], [address] [place], [place] [address]     Procedures and diagnostic studies:  DG Chest Port 1 View  Result Date: 12/07/2020 CLINICAL DATA:  Altered mental status EXAM: PORTABLE CHEST 1 VIEW COMPARISON:  02/21/2020 FINDINGS: Cardiac shadow is stable. Aortic calcifications are noted. Tortuous thoracic aorta is noted. The lungs are clear bilaterally. No focal infiltrate or effusion is noted. No bony abnormality is noted. IMPRESSION: No active disease. Electronically Signed   By: Alcide Clever M.D.   On: 12/07/2020 10:36   VAS [place] LOWER EXTREMITY VENOUS (DVT) (ONLY [facility] & [facility] 7a-7p)  Result Date: 12/07/2020  Lower Venous DVT Study Patient Name:  OUITA NISH  Date of Exam:   12/07/2020 Medical Rec #: [id number]        Accession #:    [id number] Date of Birth: 02-19-1919       Patient Gender: F  Patient Age:   18 years Exam Location:  [facility] Procedure:      VAS [place] LOWER EXTREMITY VENOUS (DVT) Referring Phys: Lorre Nick --------------------------------------------------------------------------------  Indications: Swelling.  Risk Factors: None identified. Limitations: Poor ultrasound/tissue interface and patient  positioning, patient pain tolerance. Comparison Study: No prior studies. Performing Technologist: Chanda Busing RVT  Examination Guidelines: A complete evaluation includes B-mode imaging, spectral Doppler, color Doppler, and power Doppler as needed of all accessible portions of each vessel. Bilateral testing is considered an integral part of a complete examination. Limited examinations for reoccurring indications may be performed as noted. The reflux portion of the exam is performed with the patient in reverse Trendelenburg.  +---------+---------------+---------+-----------+----------+--------------+ RIGHT    CompressibilityPhasicitySpontaneityPropertiesThrombus Aging +---------+---------------+---------+-----------+----------+--------------+ CFV      Full           Yes      Yes                                 +---------+---------------+---------+-----------+----------+--------------+ SFJ      Full                                                        +---------+---------------+---------+-----------+----------+--------------+ FV Prox  Full                                                        +---------+---------------+---------+-----------+----------+--------------+ FV Mid   Full                                                        +---------+---------------+---------+-----------+----------+--------------+ FV DistalFull                                                        +---------+---------------+---------+-----------+----------+--------------+ PFV      Full                                                         +---------+---------------+---------+-----------+----------+--------------+ POP      Full           Yes      Yes                                 +---------+---------------+---------+-----------+----------+--------------+ PTV      Full                                                        +---------+---------------+---------+-----------+----------+--------------+ PERO     Full                                                        +---------+---------------+---------+-----------+----------+--------------+   +----+---------------+---------+-----------+----------+--------------+  LEFTCompressibilityPhasicitySpontaneityPropertiesThrombus Aging +----+---------------+---------+-----------+----------+--------------+ CFV Full           Yes      Yes                                 +----+---------------+---------+-----------+----------+--------------+     Summary: RIGHT: - There is no evidence of deep vein thrombosis in the lower extremity. However, portions of this examination were limited- see technologist comments above.  - No cystic structure found in the popliteal fossa.  LEFT: - No evidence of common femoral vein obstruction.  *See table(s) above for measurements and observations. Electronically signed by Lemar LivingsBrandon Cain MD on 12/07/2020 at 5:33:00 PM.    Final     Medications:    amLODipine  5 mg Oral Daily   aspirin EC  81 mg Oral Daily   busPIRone  7.5 mg Oral BID   darifenacin  7.5 mg Oral Daily   enoxaparin (LOVENOX) injection  30 mg Subcutaneous Q24H   folic acid  1 mg Oral QPM   metoprolol succinate  12.5 mg Oral BID   prednisoLONE acetate  1 drop Right Eye QHS   Continuous Infusions:  cefTRIAXone (ROCEPHIN)  IV Stopped (12/08/20 1013)   lactated ringers 75 mL/hr at 12/09/20 0650     LOS: 1 day   Joseph ArtJessica U Novalynn Branaman  [facility]   How to contact the [facility] Attending or Consulting provider 7A - 7P or covering provider during after hours 7P -7A, for this  patient?  Check the care team in [facility] and look for a) attending/consulting TRH provider listed and b) the [facility] team listed Log into [email or web address] and use [place]'s universal password to access. If you do not have the password, please contact the hospital operator. Locate the [facility] provider you are looking for under [facility] and page to a number that you can be directly reached. If you still have difficulty reaching the provider, please page the [facility] (Director on Call) for the Hospitalists listed on amion for assistance.  12/09/2020, 8:49 AM

## 2020-12-10 LAB — CBC
HCT: 25.6 % — ABNORMAL LOW (ref 36.0–46.0)
Hemoglobin: 8.1 g/dL — ABNORMAL LOW (ref 12.0–15.0)
MCH: 29.9 pg (ref 26.0–34.0)
MCHC: 31.6 g/dL (ref 30.0–36.0)
MCV: 94.5 fL (ref 80.0–100.0)
Platelets: 253 10*3/uL (ref 150–400)
RBC: 2.71 MIL/uL — ABNORMAL LOW (ref 3.87–5.11)
RDW: 13.5 % (ref 11.5–15.5)
WBC: 7.8 10*3/uL (ref 4.0–10.5)
nRBC: 0 % (ref 0.0–0.2)

## 2020-12-10 LAB — BASIC METABOLIC PANEL
Anion gap: 7 (ref 5–15)
BUN: 19 mg/dL (ref 8–23)
CO2: 22 mmol/L (ref 22–32)
Calcium: 8.6 mg/dL — ABNORMAL LOW (ref 8.9–10.3)
Chloride: 108 mmol/L (ref 98–111)
Creatinine, Ser: 0.96 mg/dL (ref 0.44–1.00)
GFR, Estimated: 53 mL/min — ABNORMAL LOW (ref >60)
Glucose, Bld: 92 mg/dL (ref 70–99)
Potassium: 4 mmol/L (ref 3.5–5.1)
Sodium: 137 mmol/L (ref 135–145)

## 2020-12-10 MED ORDER — LIP MEDEX EX OINT
TOPICAL_OINTMENT | CUTANEOUS | Status: AC
  Filled 2020-12-10: qty 7

## 2020-12-10 MED ORDER — COVID-19 MRNA VAC-TRIS(PFIZER) 30 MCG/0.3ML IM SUSP
0.3000 mL | Freq: Once | INTRAMUSCULAR | Status: AC
  Administered 2020-12-10: 0.3 mL via INTRAMUSCULAR
  Filled 2020-12-10: qty 0.3

## 2020-12-10 NOTE — Progress Notes (Signed)
  Speech Language Pathology Treatment: Dysphagia  Patient Details Name: Lisa Freeman MRN: [id number] DOB: 1918/09/28 Today's Date: 12/10/2020 Time: 1030-1314 SLP Time Calculation (min) (ACUTE ONLY): 18 min  Assessment / Plan / Recommendation Clinical Impression  Pt seen for dysphagia management/address dysphagia goals.    Pt has dentures in place - upper and lower - but adhesive only on upper - SLP brushed dentures and placed adhesive on lowers to allow improved seal - especially as RN reported pt had difficulty managing the roast beef today.    Observed pt with liquids intake only due to her discomfort - Subtle cough x1 of approx 10 liquid swallows via straw - immediately post-swallow.  This did not appear uncomfortable to pt nor cause alarm.    Given pt is on Room Air, with decreased lung sounds and negative CXR upon admit, do not recommend diet modification beyond soft foods (needed due to ill fitting dentures).    Encouraged pt to drink plenty of water if MD indicates given her h/o UTI.  Pt prefers WARM WATER- not even room temperature.   She winced and reported pain when consuming Cranberry juice via straw-and tip of straw - suspect due to her labial crease breakdown. Recommend to avoid acidic drinks.   SLP updated swallow precaution sign in pt's room and spoke to RN re: swallow precautions/recommendations - requesting RN provide pt with Carmex if able for her labial discomfort. .    No SLP follow up indicated as care plan in place for pt's comfort/swallowing safety.  Thanks for allowing SLP to assist with pt's care plan.  HPI   Lisa Freeman is an 87 y.o. female with medical history significant for hypertension, dementia, who presents to the emergency department on 12/07/2020 with altered mental status and hematuria.  Pt found to have mounds of fungus with redness at labia.  Pt did not pass yale swallow screen - due to coughing with intake.  Pt endorses h/o dysphagia requiring her  to take "small bites/sips and chew well" as she confirms h/o "choking" easily over the last year.  Pt with negative CXR.  CT soft tissue of neck 08/11/2019 for "sore throat" showed Nonspecific effacement of the right lateral oropharynx and vallecula.  Multilevel cervical spine degenerative changes. Heterogeneous and enlarged thyroid without recommendation for work up given pt's advanced age and multiple comorbidities.  C4=C5 degenerative spine noted on imaging in 2012.  Patient was started empirically on IV ceftriaxone for UTI per MD notes.     SLP Plan  All goals met       Recommendations  Diet recommendations: Dysphagia 3 (mechanical soft);Thin liquid Liquids provided via: Cup;Straw Medication Administration: Whole meds with puree Supervision: Patient able to self feed Compensations: Slow rate;Small sips/bites Postural Changes and/or Swallow Maneuvers: Seated upright 90 degrees;Upright 30-60 min after meal                Oral Care Recommendations: Oral care BID Follow up Recommendations: None SLP Visit Diagnosis: Dysphagia, unspecified (R13.10) Plan: All goals met       GO              Kathleen Lime, MS [facility] SLP Acute Rehab Services Office [phone] Pager [phone]   Macario Golds 12/10/2020, 5:18 PM

## 2020-12-10 NOTE — Progress Notes (Signed)
PROGRESS NOTE    Lisa BinetSavannah Freeman  ZOX:[id number] DOB: Feb 21, 1919 DOA: 12/07/2020 PCP: Bailey MechPodraza, Cole Christopher, PA-C   Chief Complaint  Patient presents with   possible UTI/AMS   Brief Narrative: 87 year old female with hypertension dementia brought to the ED on 8/29 with altered mental status.  She was found to have E. coli UTI admitted and treated with IV antibiotics.  Seen by PT OT and at this time plan is for skilled nursing facility for deconditioning. She is medically stable for discharge on oral cefadroxil once a skilled nursing facility available. She will continue on dysphagia 3 diet.  Subjective: Self-feeding.  Alert awake with baseline dementia."  I am feeling better today" " can you raise this table little bit as I cannot move"  Assessment & Plan:  E. coli UTI: On ceftriaxone can switch to po on discharge monitoring better  Acute metabolic encephalopathy  Dementia Alzheimer's with behavioral disturbance  Mental status at baseline at this time.  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 Cont on topical treatment  Hypertension: Stable on amlodipine and metoprolol Anxiety: Mood stable Deconditioning: Awaiting for SNF Diet Order             DIET DYS 3 Room service appropriate? Yes; Fluid consistency: Thin  Diet effective now                   Patient's Body mass index is 19.53 kg/m. DVT prophylaxis: enoxaparin (LOVENOX) injection 30 mg Start: 12/07/20 2200 SCDs Start: 12/07/20 1415 Code Status:   Code Status: DNR  Family Communication: plan of care discussed with patient at bedside. Status is: Inpatient  Remains inpatient appropriate because:IV treatments appropriate due to intensity of illness or inability to take PO and Inpatient level of care  appropriate due to severity of illness  Dispo: The patient is from: Home              Anticipated d/c is to: SNF              Patient currently is medically stable to d/c.   Difficult to place patient No Unresulted Labs (From admission, onward)    None       Medications reviewed:  Scheduled Meds:  amLODipine  5 mg Oral Daily   aspirin EC  81 mg Oral Daily   busPIRone  7.5 mg Oral BID   darifenacin  7.5 mg Oral Daily   enoxaparin (LOVENOX) injection  30 mg Subcutaneous Q24H   folic acid  1 mg Oral QPM   metoprolol succinate  12.5 mg Oral BID   prednisoLONE acetate  1 drop Right Eye QHS   Continuous Infusions:   ceFAZolin (ANCEF) IV 1 g (12/10/20 0641)   Consultants:see note  Procedures:see note Antimicrobials: Anti-infectives (From admission, onward)    Start     Dose/Rate Route Frequency Ordered Stop   12/10/20 0600  ceFAZolin (ANCEF) IVPB 1 g/50 mL premix        1 g 100 mL/hr over 30 Minutes Intravenous Every 8 hours 12/09/20 1231 12/11/20 0559   12/08/20 1000  cefTRIAXone (ROCEPHIN) 1 g in sodium chloride 0.9 % 100 mL IVPB  Status:  Discontinued        1 g 200 mL/hr over 30 Minutes Intravenous Daily 12/07/20 1847 12/09/20 1914   12/07/20 1215  cefTRIAXone (ROCEPHIN) 1 g in sodium chloride 0.9 % 100 mL IVPB        1 g 200 mL/hr over 30 Minutes Intravenous  Once 12/07/20 1207 12/07/20 1258      Culture/Microbiology    Component Value Date/Time   SDES  12/08/2020 9675    BLOOD BLOOD RIGHT FOREARM Performed at [facility], [address], [place], [place] [address]    SPECREQUEST  12/08/2020 [phone]    BOTTLES DRAWN AEROBIC ONLY Blood Culture results may not be optimal due to an inadequate volume of blood received in culture bottles Performed at [facility], [address] [place], [place] [address]    CULT  12/08/2020 [phone]    NO GROWTH <12 HOURS Performed at [facility], [address] [place], [place] [address]     REPTSTATUS PENDING 12/08/2020 [phone]    Other culture-see note  Objective: Vitals: Today's Vitals   12/09/20 1852 12/09/20 1952 12/09/20 2104 12/10/20 0548  BP: 134/74  (!) 118/94 (!) 155/62  Pulse: 77  99 80  Resp: 16  18 18   Temp: 98.6 F (37 C)  99.4 F (37.4 C) 99.9 F (37.7 C)  TempSrc: Oral  Oral Oral  SpO2: 95%  97% 97%  Weight:      Height:      PainSc:  0-No pain      Intake/Output Summary (Last 24 hours) at 12/10/2020 0932 Last data filed at 12/10/2020 0641 Gross per 24 hour  Intake 580 ml  Output 0 ml  Net 580 ml   Filed Weights   12/07/20 0914 12/08/20 0835  Weight: 43.2 kg 45.4 kg   Weight change:   Intake/Output from previous day: 08/31 0701 - 09/01 0700 In: 580 [P.O.:480; IV Piggyback:100] Out: 0  Intake/Output this shift: No intake/output data recorded. Filed Weights   12/07/20 0914 12/08/20 0835  Weight: 43.2 kg 45.4 kg   Examination: General exam: Thin and frail, alert awake with baseline dementia she thinks is 87 year old  HEENT:Oral mucosa moist, Ear/Nose WNL grossly,dentition normal. Respiratory system: bilaterally clear breath sounds,no use of accessory muscle, non tender. Cardiovascular system: S1 & S2 +,No JVD. Gastrointestinal system: Abdomen soft, NT,ND, BS+. Nervous System:Alert, awake, moving extremities Extremities: no edema, distal peripheral pulses palpable.  Skin: No rashes,no icterus. MSK: Normal muscle bulk,tone, power.  Data Reviewed: I have personally reviewed following labs and imaging studies CBC: Recent Labs  Lab 12/07/20 0949 12/08/20 0630 12/10/20 0436  WBC 10.6* 10.5 7.8  NEUTROABS 8.3*  --   --   HGB 10.0* 10.9* 8.1*  HCT 32.3* 34.8* 25.6*  MCV 97.9 96.4 94.5  PLT 294 295 253   Basic Metabolic Panel: Recent Labs  Lab 12/07/20 0949 12/08/20 0630 12/10/20 0436  NA 133* 135 137  K 4.1 4.5 4.0  CL 101 104 108  CO2 23 21* 22  GLUCOSE 112* 73 92  BUN 28* 17 19  CREATININE 1.07* 0.82 0.96  CALCIUM 9.1  9.4 8.6*   GFR: Estimated Creatinine Clearance: 22.9 mL/min (by C-G formula based on SCr of 0.96 mg/dL). Liver Function Tests: Recent Labs  Lab 12/07/20 0949  AST 22  ALT 20  ALKPHOS 78  BILITOT 0.5  PROT 7.3  ALBUMIN 3.7   No results for input(s): LIPASE, AMYLASE in the last 168 hours. No results for input(s): AMMONIA in the last 168 hours. Coagulation Profile: No results for input(s): INR, PROTIME in the last 168 hours. Cardiac Enzymes: No results  for input(s): CKTOTAL, CKMB, CKMBINDEX, TROPONINI in the last 168 hours. BNP (last 3 results) No results for input(s): PROBNP in the last 8760 hours. HbA1C: No results for input(s): HGBA1C in the last 72 hours. CBG: No results for input(s): GLUCAP in the last 168 hours. Lipid Profile: No results for input(s): CHOL, HDL, LDLCALC, TRIG, CHOLHDL, LDLDIRECT in the last 72 hours. Thyroid Function Tests: No results for input(s): TSH, T4TOTAL, FREET4, T3FREE, THYROIDAB in the last 72 hours. Anemia Panel: No results for input(s): VITAMINB12, FOLATE, FERRITIN, TIBC, IRON, RETICCTPCT in the last 72 hours. Sepsis Labs: Recent Labs  Lab 12/07/20 0949  LATICACIDVEN 0.8    Recent Results (from the past 240 hour(s))  Urine Culture     Status: Abnormal   Collection Time: 12/07/20  9:49 AM   Specimen: Urine, Clean Catch  Result Value Ref Range Status   Specimen Description   Final    URINE, CLEAN CATCH Performed at [facility], [address] [place], [place] [address]    Special Requests   Final    NONE Performed at [facility], [address] [place], [place] [address]    Culture >=100,000 COLONIES/mL ESCHERICHIA COLI (A)  Final   Report Status 12/09/2020 FINAL  Final   Organism ID, Bacteria ESCHERICHIA COLI (A)  Final      Susceptibility   Escherichia coli - MIC*    AMPICILLIN 8 SENSITIVE Sensitive     CEFAZOLIN <=4 SENSITIVE Sensitive     CEFEPIME <=0.12 SENSITIVE Sensitive      CEFTRIAXONE <=0.25 SENSITIVE Sensitive     CIPROFLOXACIN >=4 RESISTANT Resistant     GENTAMICIN <=1 SENSITIVE Sensitive     IMIPENEM <=0.25 SENSITIVE Sensitive     NITROFURANTOIN <=16 SENSITIVE Sensitive     TRIMETH/SULFA <=20 SENSITIVE Sensitive     AMPICILLIN/SULBACTAM 4 SENSITIVE Sensitive     PIP/TAZO <=4 SENSITIVE Sensitive     * >=100,000 COLONIES/mL ESCHERICHIA COLI  Culture, blood (Routine X 2) w Reflex to ID Panel     Status: None (Preliminary result)   Collection Time: 12/07/20  9:49 AM   Specimen: BLOOD  Result Value Ref Range Status   Specimen Description   Final    BLOOD RIGHT ANTECUBITAL Performed at [facility], [address] [place], [place] [address]    Special Requests   Final    BOTTLES DRAWN AEROBIC AND ANAEROBIC Blood Culture results may not be optimal due to an excessive volume of blood received in culture bottles Performed at [facility], [address] [place], [place] [address]    Culture   Final    NO GROWTH 3 DAYS Performed at [facility], [address], [place], [place] [address]    Report Status PENDING  Incomplete  Resp Panel by RT-PCR (Flu A&B, Covid) Nasopharyngeal Swab     Status: None   Collection Time: 12/07/20  9:55 AM   Specimen: Nasopharyngeal Swab; Nasopharyngeal(NP) swabs in vial transport medium  Result Value Ref Range Status   SARS Coronavirus 2 by RT PCR NEGATIVE NEGATIVE Final    Comment: (NOTE) SARS-CoV-2 target nucleic acids are NOT DETECTED.  The SARS-CoV-2 RNA is generally detectable in upper respiratory specimens during the acute phase of infection. The lowest concentration of SARS-CoV-2 viral copies this assay can detect is 138 copies/mL. A negative result does not preclude SARS-Cov-2 infection and should not be used as the sole basis for treatment or other patient management  decisions. A negative result may occur with  improper specimen collection/handling, submission of  specimen other than nasopharyngeal swab, presence of viral mutation(s) within the areas targeted by this assay, and inadequate number of viral copies(<138 copies/mL). A negative result must be combined with clinical observations, patient history, and epidemiological information. The expected result is Negative.  Fact Sheet for Patients:  BloggerCourse.com  Fact Sheet for Healthcare Providers:  SeriousBroker.it  This test is no t yet approved or cleared by the [place] FDA and  has been authorized for detection and/or diagnosis of SARS-CoV-2 by FDA under an Emergency Use Authorization (EUA). This EUA will remain  in effect (meaning this test can be used) for the duration of the COVID-19 declaration under Section 564(b)(1) of the Act, 21 U.S.C.section 360bbb-3(b)(1), unless the authorization is terminated  or revoked sooner.       Influenza A by PCR NEGATIVE NEGATIVE Final   Influenza B by PCR NEGATIVE NEGATIVE Final    Comment: (NOTE) The Xpert Xpress SARS-CoV-2/FLU/RSV plus assay is intended as an aid in the diagnosis of influenza from Nasopharyngeal swab specimens and should not be used as a sole basis for treatment. Nasal washings and aspirates are unacceptable for Xpert Xpress SARS-CoV-2/FLU/RSV testing.  Fact Sheet for Patients: BloggerCourse.com  Fact Sheet for Healthcare Providers: SeriousBroker.it  This test is not yet approved or cleared by the [place] FDA and has been authorized for detection and/or diagnosis of SARS-CoV-2 by FDA under an Emergency Use Authorization (EUA). This EUA will remain in effect (meaning this test can be used) for the duration of the COVID-19 declaration under Section 564(b)(1) of the Act, 21 U.S.C. section 360bbb-3(b)(1), unless the authorization is terminated or revoked.  Performed at [facility], [address], [place], [place] [address]   Culture, blood (Routine X 2) w Reflex to ID Panel     Status: None (Preliminary result)   Collection Time: 12/08/20  6:39 AM   Specimen: BLOOD  Result Value Ref Range Status   Specimen Description   Final    BLOOD BLOOD RIGHT FOREARM Performed at [facility], [address], [place], [place] [address]    Special Requests   Final    BOTTLES DRAWN AEROBIC ONLY Blood Culture results may not be optimal due to an inadequate volume of blood received in culture bottles Performed at [facility], [address], [place], [place] [address]    Culture   Final    NO GROWTH <12 HOURS Performed at [facility], [address], [place], [place] [address]    Report Status PENDING  Incomplete     Radiology Studies: No results found.   LOS: 2 days   Lanae Boast, MD [facility]  12/10/2020, 9:32 AM

## 2020-12-10 NOTE — TOC Progression Note (Signed)
Transition of Care [facility]) - Progression Note   Patient Details  Name: Lisa Freeman MRN: [id number] Date of Birth: 08/21/1918  [facility]) CM/SW Contact  Ewing Schlein, LCSW Phone Number: 12/10/2020, 3:21 PM  Clinical Narrative: Patient received two bed offers and daughter selected [facility] in [place]. CSW spoke with Jomarie Longs in admissions and the facility can accept the patient tomorrow pending negative COVID test and would prefer if the patient was also boosted for COVID. Daughter provided consent for booster. Daughter provided copies of patient's vaccine cards to be provided to the SNF. TOC to follow.  Expected Discharge Plan: Skilled Nursing Facility Barriers to Discharge: Continued Medical Work up  Expected Discharge Plan and Services Expected Discharge Plan: Skilled Nursing Facility In-house Referral: Clinical Social Work Post Acute Care Choice: Skilled Nursing Facility Living arrangements for the past 2 months: Single Family Home              DME Arranged: N/A DME Agency: NA  Readmission Risk Interventions No flowsheet data found.

## 2020-12-11 LAB — SARS CORONAVIRUS 2 (TAT 6-24 HRS): SARS Coronavirus 2: NEGATIVE

## 2020-12-11 MED ORDER — CEFADROXIL 500 MG PO CAPS: 500.0000 mg | ORAL_CAPSULE | Freq: Two times a day (BID) | ORAL | 0 refills | Status: AC

## 2020-12-11 NOTE — Progress Notes (Signed)
Report given to Herbert Seta, RN at [facility].

## 2020-12-11 NOTE — Discharge Summary (Signed)
Physician Discharge Summary  Leodis BinetSavannah Esper ZOX:[id number] DOB: [birth year] DOA: 12/07/2020  PCP: Bailey MechPodraza, Cole Christopher, PA-C  Admit date: 12/07/2020 Discharge date: 12/11/2020  Admitted From: hom Disposition:  SNF  Recommendations for Outpatient Follow-up:  Follow up with PCP in 1-2 weeks  Home Health:NO  Equipment/Devices: NONE  Discharge Condition: Stable Code Status:   Code Status: DNR Diet recommendation:  Diet Order             DIET DYS 3 Room service appropriate? Yes; Fluid consistency: Thin  Diet effective now                    Brief/Interim Summary: 87 year old female with hypertension dementia brought to the ED on 8/29 with altered mental status.  She was found to have E. coli UTI admitted and treated with IV antibiotics.  Seen by PT OT and at this time plan is for skilled nursing facility for deconditioning. She is medically stable for discharge on oral cefadroxil once a skilled nursing facility available. She will continue on dysphagia 3 diet.  Social worker was able to find skilled nursing facility she was given booster dose and had negative COVID swab.  She is medically safe for discharge to skilled nursing facility  Discharge Diagnoses:   E. coli UTI we will continue oral antibiotics to complete the course  Acute metabolic encephalopathy  Dementia Alzheimer's with behavioral disturbance  Mental status at baseline at this time.  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 Cont on topical treatment. No pressure ulcers.  Chronic edematous/chronic venous stasis of lower extremities-elevate. monitor Hypertension: Stable on amlodipine and metoprolol Anxiety: Mood stable.  Not taking Ativan anymore and discontinued. Deconditioning:for  SNF   Consults: Slp Wound care  Subjective: Alert awake, not in distress.  Pleasant. Reports her legs are so much better and are not hurting anymore. She is thankful that she is better.  Discharge Exam: Vitals:   12/10/20 2154 12/11/20 0544  BP: (!) 173/61 (!) 147/57  Pulse: 71 63  Resp: 18 18  Temp: 98.9 F (37.2 C) 99.2 F (37.3 C)  SpO2: 96% 96%   General: Pt is alert, awake, not in acute distress Cardiovascular: RRR, S1/S2 +, no rubs, no gallops Respiratory: CTA bilaterally, no wheezing, no rhonchi Abdominal: Soft, NT, ND, bowel sounds + Extremities: Chronic edematous appearing lower extremities chronic skin changes   Discharge Instructions  Discharge Instructions     Discharge instructions   Complete by: As directed    Please call call MD or return to ER for similar or worsening recurring problem that brought you to hospital or if any fever,nausea/vomiting,abdominal pain, uncontrolled pain, chest pain,  shortness of breath or any other alarming symptoms.  Please follow-up your doctor as instructed in a week time and call the office for appointment.  Please avoid alcohol, smoking, or any other illicit substance and maintain healthy habits including taking your regular medications as prescribed.  You were cared for by a hospitalist during your hospital stay. If you have any questions about your discharge medications or the care you received while you were in the hospital after you are discharged, you can call the unit and ask to speak with the hospitalist on call if the hospitalist that took care of you is not available.  Once you are  discharged, your primary care physician will handle any further medical issues. Please note that NO REFILLS for any discharge medications will be authorized once you are discharged, as it is imperative that you return to your primary care physician (or establish a relationship with a primary care physician if you do not have one) for your  aftercare needs so that they can reassess your need for medications and monitor your lab values   Increase activity slowly   Complete by: As directed       Allergies as of 12/11/2020       Reactions   Doxycycline Shortness Of Breath   Fluticasone Propionate Shortness Of Breath   Moxifloxacin Shortness Of Breath   Nitrofurantoin Monohyd Macro Shortness Of Breath   Penicillins Shortness Of Breath   Tolerates cephalexin Did it involve swelling of the face/tongue/throat, SOB, or low BP? Unknown Did it involve sudden or severe rash/hives, skin peeling, or any reaction on the inside of your mouth or nose? Unknown Did you need to seek medical attention at a hospital or doctor's office? Unknown When did it last happen?       If all above answers are "NO", may proceed with cephalosporin use.   Promethazine Shortness Of Breath   Sulfa Antibiotics Shortness Of Breath   Trimethoprim Shortness Of Breath   Codeine Nausea And Vomiting        Medication List     STOP taking these medications    ALPRAZolam 0.5 MG tablet Commonly known as: XANAX       TAKE these medications    acetaminophen 500 MG tablet Commonly known as: TYLENOL Take 1,000 mg by mouth every 6 (six) hours as needed for moderate pain.   amLODipine 5 MG tablet Commonly known as: NORVASC Take 5 mg by mouth daily.   aspirin EC 81 MG tablet Take 81 mg by mouth daily.   busPIRone 7.5 MG tablet Commonly known as: BUSPAR Take 7.5 mg by mouth 2 (two) times daily.   cefadroxil 500 MG capsule Commonly known as: DURICEF Take 1 capsule (500 mg total) by mouth 2 (two) times daily for 2 days.   darifenacin 7.5 MG 24 hr tablet Commonly known as: ENABLEX Take 7.5 mg by mouth daily.   folic acid 1 MG tablet Commonly known as: FOLVITE Take 1 mg by mouth every evening.   metoprolol succinate 25 MG 24 hr tablet Commonly known as: TOPROL-XL Take 12.5 mg by mouth 2 (two) times daily.   Olopatadine HCl 0.2 % Soln Place  1 drop into the right eye daily as needed (allergy symptoms).   prednisoLONE acetate 1 % ophthalmic suspension Commonly known as: PRED FORTE Place 1 drop into the right eye at bedtime.   senna 8.6 MG tablet Commonly known as: SENOKOT Take 1 tablet by mouth daily as needed.   sodium chloride 2 % ophthalmic solution Commonly known as: MURO 128 Place 1 drop into both eyes every 4 (four) hours as needed for eye irritation.   Vitamin D (Ergocalciferol) 1.25 MG (50000 UNIT) Caps capsule Commonly known as: DRISDOL Take 50,000 Units by mouth once a week. Thursday        Contact information for follow-up providers     Podraza, Rudy Jew, PA-C Follow up in 1 week(s).   Specialty: Physician Assistant Contact information: [address] [place] [address] [phone]              Contact information for after-discharge care  Destination     [facility] .   Service: Skilled Nursing Contact information: [address] [place] [address] [phone]                    Allergies  Allergen Reactions   Doxycycline Shortness Of Breath   Fluticasone Propionate Shortness Of Breath   Moxifloxacin Shortness Of Breath   Nitrofurantoin Monohyd Macro Shortness Of Breath   Penicillins Shortness Of Breath    Tolerates cephalexin Did it involve swelling of the face/tongue/throat, SOB, or low BP? Unknown Did it involve sudden or severe rash/hives, skin peeling, or any reaction on the inside of your mouth or nose? Unknown Did you need to seek medical attention at a hospital or doctor's office? Unknown When did it last happen?       If all above answers are "NO", may proceed with cephalosporin use.    Promethazine Shortness Of Breath   Sulfa Antibiotics Shortness Of Breath   Trimethoprim Shortness Of Breath   Codeine Nausea And Vomiting    The results of significant diagnostics from this hospitalization  (including imaging, microbiology, ancillary and laboratory) are listed below for reference.    Microbiology: Recent Results (from the past 240 hour(s))  Urine Culture     Status: Abnormal   Collection Time: 12/07/20  9:49 AM   Specimen: Urine, Clean Catch  Result Value Ref Range Status   Specimen Description   Final    URINE, CLEAN CATCH Performed at [facility], [address] [place], [place] [address]    Special Requests   Final    NONE Performed at [facility], [address] [place], [place] [address]    Culture >=100,000 COLONIES/mL ESCHERICHIA COLI (A)  Final   Report Status 12/09/2020 FINAL  Final   Organism ID, Bacteria ESCHERICHIA COLI (A)  Final      Susceptibility   Escherichia coli - MIC*    AMPICILLIN 8 SENSITIVE Sensitive     CEFAZOLIN <=4 SENSITIVE Sensitive     CEFEPIME <=0.12 SENSITIVE Sensitive     CEFTRIAXONE <=0.25 SENSITIVE Sensitive     CIPROFLOXACIN >=4 RESISTANT Resistant     GENTAMICIN <=1 SENSITIVE Sensitive     IMIPENEM <=0.25 SENSITIVE Sensitive     NITROFURANTOIN <=16 SENSITIVE Sensitive     TRIMETH/SULFA <=20 SENSITIVE Sensitive     AMPICILLIN/SULBACTAM 4 SENSITIVE Sensitive     PIP/TAZO <=4 SENSITIVE Sensitive     * >=100,000 COLONIES/mL ESCHERICHIA COLI  Culture, blood (Routine X 2) w Reflex to ID Panel     Status: None (Preliminary result)   Collection Time: 12/07/20  9:49 AM   Specimen: BLOOD  Result Value Ref Range Status   Specimen Description   Final    BLOOD RIGHT ANTECUBITAL Performed at [facility], [address], [place], [place] [address]    Special Requests   Final    BOTTLES DRAWN AEROBIC AND ANAEROBIC Blood Culture results may not be optimal due to an excessive volume of blood received in culture bottles Performed at [facility], [address], [place], [place] [address]    Culture   Final    NO GROWTH 4 DAYS Performed at [facility], [address], [place], [place] [address]    Report Status PENDING  Incomplete  Resp Panel by RT-PCR (Flu A&B, Covid) Nasopharyngeal Swab     Status: None   Collection Time:  12/07/20  9:55 AM   Specimen: Nasopharyngeal Swab; Nasopharyngeal(NP) swabs in vial transport medium  Result Value Ref Range Status   SARS Coronavirus 2 by RT PCR NEGATIVE NEGATIVE Final    Comment: (NOTE) SARS-CoV-2 target nucleic acids are NOT DETECTED.  The SARS-CoV-2 RNA is generally detectable in upper respiratory specimens during the acute phase of infection. The lowest concentration of SARS-CoV-2 viral copies this assay can detect is 138 copies/mL. A negative result does not preclude SARS-Cov-2 infection and should not be used as the sole basis for treatment or other patient management decisions. A negative result may occur with  improper specimen collection/handling, submission of specimen other than nasopharyngeal swab, presence of viral mutation(s) within the areas targeted by this assay, and inadequate number of viral copies(<138 copies/mL). A negative result must be combined with clinical observations, patient history, and epidemiological information. The expected result is Negative.  Fact Sheet for Patients:  BloggerCourse.com  Fact Sheet for Healthcare Providers:  SeriousBroker.it  This test is no t yet approved or cleared by the [place] FDA and  has been authorized for detection and/or diagnosis of SARS-CoV-2 by FDA under an Emergency Use Authorization (EUA). This EUA will remain  in effect (meaning this test can be used) for the duration of the COVID-19 declaration under Section 564(b)(1) of the Act, 21 U.S.C.section 360bbb-3(b)(1), unless the authorization is terminated  or revoked sooner.       Influenza A by PCR NEGATIVE NEGATIVE Final   Influenza B by PCR NEGATIVE NEGATIVE Final    Comment: (NOTE) The Xpert Xpress  SARS-CoV-2/FLU/RSV plus assay is intended as an aid in the diagnosis of influenza from Nasopharyngeal swab specimens and should not be used as a sole basis for treatment. Nasal washings and aspirates are unacceptable for Xpert Xpress SARS-CoV-2/FLU/RSV testing.  Fact Sheet for Patients: BloggerCourse.com  Fact Sheet for Healthcare Providers: SeriousBroker.it  This test is not yet approved or cleared by the [place] FDA and has been authorized for detection and/or diagnosis of SARS-CoV-2 by FDA under an Emergency Use Authorization (EUA). This EUA will remain in effect (meaning this test can be used) for the duration of the COVID-19 declaration under Section 564(b)(1) of the Act, 21 U.S.C. section 360bbb-3(b)(1), unless the authorization is terminated or revoked.  Performed at [facility], [address], [place], [place] [address]   Culture, blood (Routine X 2) w Reflex to ID Panel     Status: None (Preliminary result)   Collection Time: 12/08/20  6:39 AM   Specimen: BLOOD  Result Value Ref Range Status   Specimen Description   Final    BLOOD BLOOD RIGHT FOREARM Performed at [facility], [address] [place], [place] [address]    Special Requests   Final    BOTTLES DRAWN AEROBIC ONLY Blood Culture results may not be optimal due to an inadequate volume of blood received in culture bottles Performed at [facility], [address], [place], [place] [address]    Culture   Final    NO GROWTH 1 DAY Performed at [facility], [address] [place], [place] [address]    Report Status PENDING  Incomplete  SARS CORONAVIRUS 2 (TAT 6-24 HRS) Nasopharyngeal Nasopharyngeal Swab     Status: None   Collection Time: 12/10/20  4:49 PM   Specimen: Nasopharyngeal Swab  Result Value Ref Range Status   SARS Coronavirus 2 NEGATIVE NEGATIVE Final    Comment:  (NOTE)  SARS-CoV-2 target nucleic acids are NOT DETECTED.  The SARS-CoV-2 RNA is generally detectable in upper and lower respiratory specimens during the acute phase of infection. Negative results do not preclude SARS-CoV-2 infection, do not rule out co-infections with other pathogens, and should not be used as the sole basis for treatment or other patient management decisions. Negative results must be combined with clinical observations, patient history, and epidemiological information. The expected result is Negative.  Fact Sheet for Patients: HairSlick.no  Fact Sheet for Healthcare Providers: quierodirigir.com  This test is not yet approved or cleared by the [place] FDA and  has been authorized for detection and/or diagnosis of SARS-CoV-2 by FDA under an Emergency Use Authorization (EUA). This EUA will remain  in effect (meaning this test can be used) for the duration of the COVID-19 declaration under Se ction 564(b)(1) of the Act, 21 U.S.C. section 360bbb-3(b)(1), unless the authorization is terminated or revoked sooner.  Performed at [facility], [address], [place], [place] [address]     Procedures/Studies: DG Chest Port 1 View  Result Date: 12/07/2020 CLINICAL DATA:  Altered mental status EXAM: PORTABLE CHEST 1 VIEW COMPARISON:  02/21/2020 FINDINGS: Cardiac shadow is stable. Aortic calcifications are noted. Tortuous thoracic aorta is noted. The lungs are clear bilaterally. No focal infiltrate or effusion is noted. No bony abnormality is noted. IMPRESSION: No active disease. Electronically Signed   By: Alcide Clever M.D.   On: 12/07/2020 10:36   VAS [place] LOWER EXTREMITY VENOUS (DVT) (ONLY [facility] & [facility] 7a-7p)  Result Date: 12/07/2020  Lower Venous DVT Study Patient Name:  PURITY IRMEN  Date of Exam:   12/07/2020 Medical Rec #: [id number]        Accession #:    [id number] Date of Birth: [birth year]       Patient  Gender: F Patient Age:   61 years Exam Location:  [facility] Procedure:      VAS [place] LOWER EXTREMITY VENOUS (DVT) Referring Phys: Lorre Nick --------------------------------------------------------------------------------  Indications: Swelling.  Risk Factors: None identified. Limitations: Poor ultrasound/tissue interface and patient positioning, patient pain tolerance. Comparison Study: No prior studies. Performing Technologist: Chanda Busing RVT  Examination Guidelines: A complete evaluation includes B-mode imaging, spectral Doppler, color Doppler, and power Doppler as needed of all accessible portions of each vessel. Bilateral testing is considered an integral part of a complete examination. Limited examinations for reoccurring indications may be performed as noted. The reflux portion of the exam is performed with the patient in reverse Trendelenburg.  +---------+---------------+---------+-----------+----------+--------------+ RIGHT    CompressibilityPhasicitySpontaneityPropertiesThrombus Aging +---------+---------------+---------+-----------+----------+--------------+ CFV      Full           Yes      Yes                                 +---------+---------------+---------+-----------+----------+--------------+ SFJ      Full                                                        +---------+---------------+---------+-----------+----------+--------------+ FV Prox  Full                                                        +---------+---------------+---------+-----------+----------+--------------+  FV Mid   Full                                                        +---------+---------------+---------+-----------+----------+--------------+ FV DistalFull                                                        +---------+---------------+---------+-----------+----------+--------------+ PFV      Full                                                         +---------+---------------+---------+-----------+----------+--------------+ POP      Full           Yes      Yes                                 +---------+---------------+---------+-----------+----------+--------------+ PTV      Full                                                        +---------+---------------+---------+-----------+----------+--------------+ PERO     Full                                                        +---------+---------------+---------+-----------+----------+--------------+   +----+---------------+---------+-----------+----------+--------------+ LEFTCompressibilityPhasicitySpontaneityPropertiesThrombus Aging +----+---------------+---------+-----------+----------+--------------+ CFV Full           Yes      Yes                                 +----+---------------+---------+-----------+----------+--------------+     Summary: RIGHT: - There is no evidence of deep vein thrombosis in the lower extremity. However, portions of this examination were limited- see technologist comments above.  - No cystic structure found in the popliteal fossa.  LEFT: - No evidence of common femoral vein obstruction.  *See table(s) above for measurements and observations. Electronically signed by Lemar Livings MD on 12/07/2020 at 5:33:00 PM.    Final     Labs: BNP (last 3 results) No results for input(s): BNP in the last 8760 hours. Basic Metabolic Panel: Recent Labs  Lab 12/07/20 0949 12/08/20 0630 12/10/20 0436  NA 133* 135 137  K 4.1 4.5 4.0  CL 101 104 108  CO2 23 21* 22  GLUCOSE 112* 73 92  BUN 28* 17 19  CREATININE 1.07* 0.82 0.96  CALCIUM 9.1 9.4 8.6*   Liver Function Tests: Recent Labs  Lab 12/07/20 0949  AST 22  ALT 20  ALKPHOS 78  BILITOT 0.5  PROT 7.3  ALBUMIN 3.7   No  results for input(s): LIPASE, AMYLASE in the last 168 hours. No results for input(s): AMMONIA in the last 168 hours. CBC: Recent Labs  Lab 12/07/20 0949  12/08/20 0630 12/10/20 0436  WBC 10.6* 10.5 7.8  NEUTROABS 8.3*  --   --   HGB 10.0* 10.9* 8.1*  HCT 32.3* 34.8* 25.6*  MCV 97.9 96.4 94.5  PLT 294 295 253   Cardiac Enzymes: No results for input(s): CKTOTAL, CKMB, CKMBINDEX, TROPONINI in the last 168 hours. BNP: Invalid input(s): POCBNP CBG: No results for input(s): GLUCAP in the last 168 hours. D-Dimer No results for input(s): DDIMER in the last 72 hours. Hgb A1c No results for input(s): HGBA1C in the last 72 hours. Lipid Profile No results for input(s): CHOL, HDL, LDLCALC, TRIG, CHOLHDL, LDLDIRECT in the last 72 hours. Thyroid function studies No results for input(s): TSH, T4TOTAL, T3FREE, THYROIDAB in the last 72 hours.  Invalid input(s): FREET3 Anemia work up No results for input(s): VITAMINB12, FOLATE, FERRITIN, TIBC, IRON, RETICCTPCT in the last 72 hours. Urinalysis    Component Value Date/Time   COLORURINE YELLOW 12/07/2020 0949   APPEARANCEUR HAZY (A) 12/07/2020 0949   LABSPEC 1.011 12/07/2020 0949   PHURINE 8.0 12/07/2020 0949   GLUCOSEU NEGATIVE 12/07/2020 0949   HGBUR SMALL (A) 12/07/2020 0949   BILIRUBINUR NEGATIVE 12/07/2020 0949   KETONESUR NEGATIVE 12/07/2020 0949   PROTEINUR 30 (A) 12/07/2020 0949   UROBILINOGEN 0.2 10/25/2012 1646   NITRITE POSITIVE (A) 12/07/2020 0949   LEUKOCYTESUR LARGE (A) 12/07/2020 0949   Sepsis Labs Invalid input(s): PROCALCITONIN,  WBC,  LACTICIDVEN Microbiology Recent Results (from the past 240 hour(s))  Urine Culture     Status: Abnormal   Collection Time: 12/07/20  9:49 AM   Specimen: Urine, Clean Catch  Result Value Ref Range Status   Specimen Description   Final    URINE, CLEAN CATCH Performed at [facility], [address] [place], [place] [address]    Special Requests   Final    NONE Performed at [facility], [address], [place], [place] [address]    Culture >=100,000 COLONIES/mL ESCHERICHIA COLI (A)  Final    Report Status 12/09/2020 FINAL  Final   Organism ID, Bacteria ESCHERICHIA COLI (A)  Final      Susceptibility   Escherichia coli - MIC*    AMPICILLIN 8 SENSITIVE Sensitive     CEFAZOLIN <=4 SENSITIVE Sensitive     CEFEPIME <=0.12 SENSITIVE Sensitive     CEFTRIAXONE <=0.25 SENSITIVE Sensitive     CIPROFLOXACIN >=4 RESISTANT Resistant     GENTAMICIN <=1 SENSITIVE Sensitive     IMIPENEM <=0.25 SENSITIVE Sensitive     NITROFURANTOIN <=16 SENSITIVE Sensitive     TRIMETH/SULFA <=20 SENSITIVE Sensitive     AMPICILLIN/SULBACTAM 4 SENSITIVE Sensitive     PIP/TAZO <=4 SENSITIVE Sensitive     * >=100,000 COLONIES/mL ESCHERICHIA COLI  Culture, blood (Routine X 2) w Reflex to ID Panel     Status: None (Preliminary result)   Collection Time: 12/07/20  9:49 AM   Specimen: BLOOD  Result Value Ref Range Status   Specimen Description   Final    BLOOD RIGHT ANTECUBITAL Performed at [facility], [address] [place], [place] [address]    Special Requests   Final    BOTTLES DRAWN AEROBIC AND ANAEROBIC Blood Culture results may not be optimal due to an excessive volume of blood received in culture bottles Performed at [facility],  [address] [place], [place] [address]    Culture   Final    NO GROWTH 4 DAYS Performed at [facility], [address], [place], [place] [address]    Report Status PENDING  Incomplete  Resp Panel by RT-PCR (Flu A&B, Covid) Nasopharyngeal Swab     Status: None   Collection Time: 12/07/20  9:55 AM   Specimen: Nasopharyngeal Swab; Nasopharyngeal(NP) swabs in vial transport medium  Result Value Ref Range Status   SARS Coronavirus 2 by RT PCR NEGATIVE NEGATIVE Final    Comment: (NOTE) SARS-CoV-2 target nucleic acids are NOT DETECTED.  The SARS-CoV-2 RNA is generally detectable in upper respiratory specimens during the acute phase of infection. The lowest concentration of SARS-CoV-2 viral copies this assay can detect  is 138 copies/mL. A negative result does not preclude SARS-Cov-2 infection and should not be used as the sole basis for treatment or other patient management decisions. A negative result may occur with  improper specimen collection/handling, submission of specimen other than nasopharyngeal swab, presence of viral mutation(s) within the areas targeted by this assay, and inadequate number of viral copies(<138 copies/mL). A negative result must be combined with clinical observations, patient history, and epidemiological information. The expected result is Negative.  Fact Sheet for Patients:  BloggerCourse.com  Fact Sheet for Healthcare Providers:  SeriousBroker.it  This test is no t yet approved or cleared by the [place] FDA and  has been authorized for detection and/or diagnosis of SARS-CoV-2 by FDA under an Emergency Use Authorization (EUA). This EUA will remain  in effect (meaning this test can be used) for the duration of the COVID-19 declaration under Section 564(b)(1) of the Act, 21 U.S.C.section 360bbb-3(b)(1), unless the authorization is terminated  or revoked sooner.       Influenza A by PCR NEGATIVE NEGATIVE Final   Influenza B by PCR NEGATIVE NEGATIVE Final    Comment: (NOTE) The Xpert Xpress SARS-CoV-2/FLU/RSV plus assay is intended as an aid in the diagnosis of influenza from Nasopharyngeal swab specimens and should not be used as a sole basis for treatment. Nasal washings and aspirates are unacceptable for Xpert Xpress SARS-CoV-2/FLU/RSV testing.  Fact Sheet for Patients: BloggerCourse.com  Fact Sheet for Healthcare Providers: SeriousBroker.it  This test is not yet approved or cleared by the [place] FDA and has been authorized for detection and/or diagnosis of SARS-CoV-2 by FDA under an Emergency Use Authorization (EUA). This EUA will remain in effect  (meaning this test can be used) for the duration of the COVID-19 declaration under Section 564(b)(1) of the Act, 21 U.S.C. section 360bbb-3(b)(1), unless the authorization is terminated or revoked.  Performed at [facility], [address], [place], [place] [address]   Culture, blood (Routine X 2) w Reflex to ID Panel     Status: None (Preliminary result)   Collection Time: 12/08/20  6:39 AM   Specimen: BLOOD  Result Value Ref Range Status   Specimen Description   Final    BLOOD BLOOD RIGHT FOREARM Performed at [facility], [address], [place], [place] [address]    Special Requests   Final    BOTTLES DRAWN AEROBIC ONLY Blood Culture results may not be optimal due to an inadequate volume of blood received in culture bottles Performed at [facility], [address], [place], [place] [address]    Culture   Final    NO GROWTH 1 DAY Performed at [facility], [address] [place], [place] [address]    Report Status PENDING  Incomplete  SARS CORONAVIRUS 2 (TAT 6-24 HRS) Nasopharyngeal Nasopharyngeal Swab     Status: None   Collection Time: 12/10/20  4:49 PM   Specimen: Nasopharyngeal Swab  Result Value Ref Range Status   SARS Coronavirus 2 NEGATIVE NEGATIVE Final    Comment: (NOTE) SARS-CoV-2 target nucleic acids are NOT DETECTED.  The SARS-CoV-2 RNA is generally detectable in upper and lower respiratory specimens during the acute phase of infection. Negative results do not preclude SARS-CoV-2 infection, do not rule out co-infections with other pathogens, and should not be used as the sole basis for treatment or other patient management decisions. Negative results must be combined with clinical observations, patient history, and epidemiological information. The expected result is Negative.  Fact Sheet for Patients: HairSlick.no  Fact Sheet for Healthcare  Providers: quierodirigir.com  This test is not yet approved or cleared by the [place] FDA and  has been authorized for detection and/or diagnosis of SARS-CoV-2 by FDA under an Emergency Use Authorization (EUA). This EUA will remain  in effect (meaning this test can be used) for the duration of the COVID-19 declaration under Se ction 564(b)(1) of the Act, 21 U.S.C. section 360bbb-3(b)(1), unless the authorization is terminated or revoked sooner.  Performed at [facility], [address], [place], [place] [address]      Time coordinating discharge: 25 minutes  SIGNED: Lanae Boast, MD  [facility] 12/11/2020, 9:08 AM  If 7PM-7AM, please contact night-coverage [email or web address]

## 2020-12-11 NOTE — TOC Transition Note (Signed)
Transition of Care [facility]) - CM/SW Discharge Note  Patient Details  Name: Lisa Freeman MRN: [id number] Date of Birth: Nov 07, 1918  Transition of Care [facility]) CM/SW Contact:  Ewing Schlein, LCSW Phone Number: 12/11/2020, 10:11 AM  Clinical Narrative: Patient ready to discharge to SNF. COVID booster was received yesterday. Discharge summary, discharge orders, and SNF transfer report faxed to facility in hub. COVID test is negative. CSW spoke with Jomarie Longs in admissions at [facility]. Patient will go to room 109 and the number for report is [phone].  Medical necessity form done. PTAR scheduled. Discharge packet completed. RN updated. CSW called daughter to notify her of discharge to SNF. TOC signing off.  Final next level of care: Skilled Nursing Facility Barriers to Discharge: Barriers Resolved  Patient Goals and CMS Choice Patient states their goals for this hospitalization and ongoing recovery are:: Go to a facility CMS Medicare.gov Compare Post Acute Care list provided to:: Patient Represenative (must comment) Choice offered to / list presented to : Adult Children  Discharge Placement Existing PASRR number confirmed : 12/09/20          Patient chooses bed at: Other - please specify in the comment section below: ([facility]) Patient to be transferred to facility by: PTAR Name of family member notified: Noreene Larsson (daughter) Patient and family notified of of transfer: 12/11/20  Discharge Plan and Services In-house Referral: Clinical Social Work Post Acute Care Choice: Skilled Nursing Facility          DME Arranged: N/A DME Agency: NA  Readmission Risk Interventions No flowsheet data found.

## 2020-12-11 NOTE — Progress Notes (Signed)
PTAR arrived to transport patient. Patient's daughter, Francena Hanly called and made aware.

## 2020-12-12 LAB — CULTURE, BLOOD (ROUTINE X 2): Culture: NO GROWTH

## 2020-12-15 LAB — CULTURE, BLOOD (ROUTINE X 2): Culture: NO GROWTH

## 2021-12-14 ENCOUNTER — Ambulatory Visit (HOSPITAL_COMMUNITY): Payer: Medicare Other

## 2021-12-21 ENCOUNTER — Encounter: Payer: Medicare Other | Admitting: Vascular Surgery

## 2022-03-04 ENCOUNTER — Emergency Department (HOSPITAL_COMMUNITY): Payer: Medicare Other

## 2022-03-04 ENCOUNTER — Other Ambulatory Visit: Payer: Self-pay

## 2022-03-04 ENCOUNTER — Emergency Department (HOSPITAL_COMMUNITY)
Admission: EM | Admit: 2022-03-04 | Discharge: 2022-03-04 | Disposition: A | Discharge status: Home / Self Care | Payer: Medicare Other | Attending: Student | Admitting: Student

## 2022-03-04 DIAGNOSIS — M79606 Pain in leg, unspecified: Secondary | ICD-10-CM | POA: Diagnosis not present

## 2022-03-04 DIAGNOSIS — R410 Disorientation, unspecified: Secondary | ICD-10-CM | POA: Diagnosis not present

## 2022-03-04 DIAGNOSIS — I700 Atherosclerosis of aorta: Secondary | ICD-10-CM | POA: Diagnosis not present

## 2022-03-04 DIAGNOSIS — W19XXXA Unspecified fall, initial encounter: Secondary | ICD-10-CM | POA: Diagnosis not present

## 2022-03-04 DIAGNOSIS — G309 Alzheimer's disease, unspecified: Secondary | ICD-10-CM | POA: Insufficient documentation

## 2022-03-04 DIAGNOSIS — I10 Essential (primary) hypertension: Secondary | ICD-10-CM | POA: Diagnosis not present

## 2022-03-04 DIAGNOSIS — D649 Anemia, unspecified: Secondary | ICD-10-CM | POA: Insufficient documentation

## 2022-03-04 DIAGNOSIS — R627 Adult failure to thrive: Secondary | ICD-10-CM

## 2022-03-04 DIAGNOSIS — M25552 Pain in left hip: Secondary | ICD-10-CM | POA: Insufficient documentation

## 2022-03-04 DIAGNOSIS — R944 Abnormal results of kidney function studies: Secondary | ICD-10-CM | POA: Diagnosis not present

## 2022-03-04 DIAGNOSIS — D72829 Elevated white blood cell count, unspecified: Secondary | ICD-10-CM | POA: Insufficient documentation

## 2022-03-04 DIAGNOSIS — Z79899 Other long term (current) drug therapy: Secondary | ICD-10-CM | POA: Diagnosis not present

## 2022-03-04 DIAGNOSIS — Y92129 Unspecified place in nursing home as the place of occurrence of the external cause: Secondary | ICD-10-CM | POA: Insufficient documentation

## 2022-03-04 DIAGNOSIS — F0280 Dementia in other diseases classified elsewhere without behavioral disturbance: Secondary | ICD-10-CM | POA: Insufficient documentation

## 2022-03-04 LAB — COMPREHENSIVE METABOLIC PANEL
ALT: 23 U/L (ref 0–44)
AST: 27 U/L (ref 15–41)
Albumin: 2.9 g/dL — ABNORMAL LOW (ref 3.5–5.0)
Alkaline Phosphatase: 78 U/L (ref 38–126)
Anion gap: 10 (ref 5–15)
BUN: 39 mg/dL — ABNORMAL HIGH (ref 8–23)
CO2: 23 mmol/L (ref 22–32)
Calcium: 9.4 mg/dL (ref 8.9–10.3)
Chloride: 105 mmol/L (ref 98–111)
Creatinine, Ser: 1.32 mg/dL — ABNORMAL HIGH (ref 0.44–1.00)
GFR, Estimated: 36 mL/min — ABNORMAL LOW (ref >60)
Glucose, Bld: 114 mg/dL — ABNORMAL HIGH (ref 70–99)
Potassium: 4.5 mmol/L (ref 3.5–5.1)
Sodium: 138 mmol/L (ref 135–145)
Total Bilirubin: 0.6 mg/dL (ref 0.3–1.2)
Total Protein: 6.9 g/dL (ref 6.5–8.1)

## 2022-03-04 LAB — CBC WITH DIFFERENTIAL/PLATELET
Abs Immature Granulocytes: 0.05 10*3/uL (ref 0.00–0.07)
Basophils Absolute: 0.1 10*3/uL (ref 0.0–0.1)
Basophils Relative: 1 %
Eosinophils Absolute: 0 10*3/uL (ref 0.0–0.5)
Eosinophils Relative: 0 %
HCT: 35.4 % — ABNORMAL LOW (ref 36.0–46.0)
Hemoglobin: 10.6 g/dL — ABNORMAL LOW (ref 12.0–15.0)
Immature Granulocytes: 1 %
Lymphocytes Relative: 13 %
Lymphs Abs: 1.4 10*3/uL (ref 0.7–4.0)
MCH: 28.6 pg (ref 26.0–34.0)
MCHC: 29.9 g/dL — ABNORMAL LOW (ref 30.0–36.0)
MCV: 95.4 fL (ref 80.0–100.0)
Monocytes Absolute: 0.8 10*3/uL (ref 0.1–1.0)
Monocytes Relative: 8 %
Neutro Abs: 8.4 10*3/uL — ABNORMAL HIGH (ref 1.7–7.7)
Neutrophils Relative %: 77 %
Platelets: 404 10*3/uL — ABNORMAL HIGH (ref 150–400)
RBC: 3.71 MIL/uL — ABNORMAL LOW (ref 3.87–5.11)
RDW: 13.6 % (ref 11.5–15.5)
WBC: 10.6 10*3/uL — ABNORMAL HIGH (ref 4.0–10.5)
nRBC: 0 % (ref 0.0–0.2)

## 2022-03-04 LAB — URINALYSIS, ROUTINE W REFLEX MICROSCOPIC
Bilirubin Urine: NEGATIVE
Glucose, UA: NEGATIVE mg/dL
Ketones, ur: NEGATIVE mg/dL
Leukocytes,Ua: NEGATIVE
Nitrite: POSITIVE — AB
Protein, ur: NEGATIVE mg/dL
Specific Gravity, Urine: 1.016 (ref 1.005–1.030)
pH: 5 (ref 5.0–8.0)

## 2022-03-04 MED ORDER — IOHEXOL 300 MG/ML  SOLN
75.0000 mL | Freq: Once | INTRAMUSCULAR | Status: AC | PRN
  Administered 2022-03-04: 75 mL via INTRAVENOUS

## 2022-03-04 MED ORDER — FOSFOMYCIN TROMETHAMINE 3 G PO PACK
3.0000 g | PACK | Freq: Once | ORAL | Status: AC
  Administered 2022-03-04: 3 g via ORAL
  Filled 2022-03-04: qty 3

## 2022-03-04 MED ORDER — FENTANYL CITRATE PF 50 MCG/ML IJ SOSY
50.0000 ug | PREFILLED_SYRINGE | Freq: Once | INTRAMUSCULAR | Status: AC
  Administered 2022-03-04: 50 ug via INTRAVENOUS
  Filled 2022-03-04: qty 1

## 2022-03-04 NOTE — ED Triage Notes (Signed)
Pt BIBA from [facility]. Pt has unwitnessed fall around 1330 today. Pt has R hip deformity w/bruising on R side.   Pt takes aspirin Aox1 at baseline  BP: 150/70 HR: 90 SPO2: 100 RA CBG: 189

## 2022-03-04 NOTE — ED Provider Notes (Signed)
[facility]-EMERGENCY DEPT Provider Note  CSN: [id number] Arrival date & time: 03/04/22 1618  Chief Complaint(s) No chief complaint on file.  HPI Lisa Freeman is a 88 y.o. female with PMH Alzheimer's dementia, HTN, A-fib who presents emergency department for evaluation of an unwitnessed fall and leg pain.  History obtained from EMS states that within the last hour patient suffered an unwitnessed fall and was found to the ground.  Takes aspirin but no additional blood thinners.  Additional history unable to be obtained secondary to the patient's dementia.  Patient arrives with a makeshift pelvic binder applied by EMS with severe tenderness to the left hip.  No additional external signs of trauma seen.  Pulses intact.   Past Medical History Past Medical History:  Diagnosis Date   Cornea transplant recipient    Right eye.   Dementia (HCC)    Hypertension    Patient Active Problem List   Diagnosis Date Noted   Acute cystitis with hematuria 12/07/2020   Acute metabolic encephalopathy 12/07/2020   Dementia, Alzheimer's, with behavior disturbance (HCC) 12/07/2020   Sacral pressure sore 12/07/2020   Colitis 02/19/2020   Acute colitis 02/18/2020   Pneumonia of right middle lobe due to infectious organism    Acute UTI 01/12/2019   AKI (acute kidney injury) (HCC) 01/12/2019   Anxiety 01/12/2019   Diarrhea 01/12/2019   Abdominal pain 01/11/2019   Colitis presumed infectious 12/27/2018   Anemia    Chronic anemia    Insomnia    Subclinical hyperthyroidism    Anxiety state, unspecified    Atrial fibrillation (HCC)    Carotid stenosis    Cerebral artery occlusion    Diverticulosis of colon    Dyshidrosis    Other and unspecified hyperlipidemia    Migraine    Rosacea    Unspecified venous (peripheral) insufficiency    Urinary tract infection, site not specified 11/10/2013   Urinary tract obstruction due to kidney stone 10/25/2012   Hypertension    Home  Medication(s) Prior to Admission medications   Medication Sig Start Date End Date Taking? Authorizing Provider  acetaminophen (TYLENOL) 500 MG tablet Take 1,000 mg by mouth every 6 (six) hours as needed for moderate pain.    [provider]  amLODipine (NORVASC) 5 MG tablet Take 5 mg by mouth daily.    [provider]  aspirin EC 81 MG tablet Take 81 mg by mouth daily.    [provider]  busPIRone (BUSPAR) 7.5 MG tablet Take 7.5 mg by mouth 2 (two) times daily.    [provider]  darifenacin (ENABLEX) 7.5 MG 24 hr tablet Take 7.5 mg by mouth daily. 10/31/20   [provider]  folic acid (FOLVITE) 1 MG tablet Take 1 mg by mouth every evening.     [provider]  metoprolol succinate (TOPROL-XL) 25 MG 24 hr tablet Take 12.5 mg by mouth 2 (two) times daily.    [provider]  Olopatadine HCl 0.2 % SOLN Place 1 drop into the right eye daily as needed (allergy symptoms).    [provider]  prednisoLONE acetate (PRED FORTE) 1 % ophthalmic suspension Place 1 drop into the right eye at bedtime.     [provider]  senna (SENOKOT) 8.6 MG tablet Take 1 tablet by mouth daily as needed. 01/14/19   [provider]  sodium chloride (MURO 128) 2 % ophthalmic solution Place 1 drop into both eyes every 4 (four) hours as needed for  eye irritation.    [provider]  Vitamin D, Ergocalciferol, (DRISDOL) 1.25 MG (50000 UNIT) CAPS capsule Take 50,000 Units by mouth once a week. Thursday 11/05/20   [provider]                                                                                                                                    Past Surgical History Past Surgical History:  Procedure Laterality Date   ABDOMINAL HYSTERECTOMY     APPENDECTOMY     BACK SURGERY     CYSTOSCOPY W/ RETROGRADES Left 11/06/2012   Procedure: CYSTOSCOPY WITH RETROGRADE PYELOGRAM;  Surgeon: Lindaann Slough, MD;   Location: [facility];  Service: Urology;  Laterality: Left;   CYSTOSCOPY W/ URETERAL STENT REMOVAL Left 11/06/2012   Procedure: CYSTOSCOPY WITH STENT REMOVAL;  Surgeon: Lindaann Slough, MD;  Location: [facility];  Service: Urology;  Laterality: Left;   CYSTOSCOPY WITH RETROGRADE PYELOGRAM, URETEROSCOPY AND STENT PLACEMENT Left 10/25/2012   Procedure: CYSTOSCOPY/LEFT RETROGRADE PYELOGRAM/PLACEMENT LEFT URETERAL STENT;  Surgeon: Lindaann Slough, MD;  Location: [facility];  Service: Urology;  Laterality: Left;  CYSTOSCOPY/LEFT RETROGRADE PYELOGRAM/URETEROSCOPY/PLACEMENT URETERAL STENT   CYSTOSCOPY WITH STENT PLACEMENT Left 11/06/2012   Procedure: CYSTOSCOPY WITH STENT PLACEMENT;  Surgeon: Lindaann Slough, MD;  Location: [facility];  Service: Urology;  Laterality: Left;   CYSTOSCOPY WITH URETEROSCOPY AND STENT PLACEMENT Left 11/06/2012   Procedure: CYSTOSCOPY WITH URETEROSCOPY, STONE MANIPULATION;  Surgeon: Lindaann Slough, MD;  Location: [facility];  Service: Urology;  Laterality: Left;   HIP FRACTURE SURGERY Left    Family History Family History  Problem Relation Age of Onset   Tuberculosis Father    Breast cancer Daughter     Social History Social History   Tobacco Use   Smoking status: Never   Smokeless tobacco: Never  Substance Use Topics   Alcohol use: No   Drug use: No   Allergies Doxycycline, Fluticasone propionate, Moxifloxacin, Nitrofurantoin monohyd macro, Penicillins, Promethazine, Sulfa antibiotics, Trimethoprim, and Codeine  Review of Systems Review of Systems  Musculoskeletal:  Positive for arthralgias.    Physical Exam Vital Signs  I have reviewed the triage vital signs BP (!) 143/90 (BP Location: Right Arm)   Pulse 91   Temp 97.7 F (36.5 C) (Oral)   Resp 18   SpO2 98%   Physical Exam Vitals and nursing note reviewed.  Constitutional:      General: She is not in acute distress.    Appearance: She is  well-developed.  HENT:     Head: Normocephalic and atraumatic.  Eyes:     Conjunctiva/sclera: Conjunctivae normal.  Cardiovascular:     Rate and Rhythm: Normal rate and regular rhythm.     Heart sounds: No murmur heard. Pulmonary:     Effort: Pulmonary effort is normal. No respiratory distress.     Breath sounds: Normal breath sounds.  Abdominal:  Palpations: Abdomen is soft.     Tenderness: There is no abdominal tenderness.  Musculoskeletal:        General: Tenderness present. No swelling.     Cervical back: Neck supple.  Skin:    General: Skin is warm and dry.     Capillary Refill: Capillary refill takes less than 2 seconds.  Neurological:     Mental Status: She is alert. Mental status is at baseline. She is disoriented.  Psychiatric:        Mood and Affect: Mood normal.     ED Results and Treatments Labs (all labs ordered are listed, but only abnormal results are displayed) Labs Reviewed  COMPREHENSIVE METABOLIC PANEL  CBC WITH DIFFERENTIAL/PLATELET  URINALYSIS, ROUTINE W REFLEX MICROSCOPIC                                                                                                                          Radiology No results found.  Pertinent labs & imaging results that were available during my care of the patient were reviewed by me and considered in my medical decision making (see MDM for details).  Medications Ordered in ED Medications  fentaNYL (SUBLIMAZE) injection 50 mcg (has no administration in time range)                                                                                                                                     Procedures Procedures  (including critical care time)  Medical Decision Making / ED Course   This patient presents to the ED for concern of fall, leg pain, this involves an extensive number of treatment options, and is a complaint that carries with it a high risk of complications and morbidity.  The differential  diagnosis includes fracture, ligamentous injury, hematoma, deconditioning,  MDM: Patient seen the emergency room for evaluation of a fall with leg pain.  Physical exam reveals a frail deconditioned patient with generalized tenderness across the entirety of the bilateral lower extremities.  Trauma imaging including CT head, C-spine, x-rays of both hips and bilateral extremities are reassuringly negative for acute traumatic injury.  Chest x-ray with possible nodule in the upper lobe but follow-up chest CT does not visualize this.  While there is certainly a possibility that the patient may have a occult fracture, I had a extensive discussion with the patient's daughter and given that the patient is 89  years old she is almost certainly not a surgical candidate and thus the utility of advanced imaging is likely not beneficial to this patient.  Laboratory evaluation with leukocytosis to 10.6, hemoglobin 10.6, creatinine 1.32, urinalysis with positive nitrites and many bacteria.  Given patient's decline in functional status, we will treat with fosfomycin but I have overall low suspicion for UTI at this time.  Given patient's current goals of care and discussion with the patient's daughter, we have shared decision making the patient will be discharged back to her skilled nursing facility with the understanding that her current functional status may indicate the beginning of a significant decline in the patient's health.  Daughter states that she would not like the patient to be hospitalized at this time and I think that this is reasonable.  Patient then discharged back to her facility.   Additional history obtained: -Additional history obtained from daughter -External records from outside source obtained and reviewed including: Chart review including previous notes, labs, imaging, consultation notes   Lab Tests: -I ordered, reviewed, and interpreted labs.   The pertinent results include:   Labs Reviewed   COMPREHENSIVE METABOLIC PANEL  CBC WITH DIFFERENTIAL/PLATELET  URINALYSIS, ROUTINE W REFLEX MICROSCOPIC      Imaging Studies ordered: I ordered imaging studies including CT head, C-spine, chest, multiple lower extremity x-rays and chest x-ray I independently visualized and interpreted imaging. I agree with the radiologist interpretation   Medicines ordered and prescription drug management: Meds ordered this encounter  Medications   fentaNYL (SUBLIMAZE) injection 50 mcg    -I have reviewed the patients home medicines and have made adjustments as needed  Critical interventions none    Cardiac Monitoring: The patient was maintained on a cardiac monitor.  I personally viewed and interpreted the cardiac monitored which showed an underlying rhythm of: NSR  Social Determinants of Health:  Factors impacting patients care include: none   Reevaluation: After the interventions noted above, I reevaluated the patient and found that they have :stayed the same  Co morbidities that complicate the patient evaluation  Past Medical History:  Diagnosis Date   Cornea transplant recipient    Right eye.   Dementia (HCC)    Hypertension       Dispostion: I considered admission for this patient, but using shared decision make with the patient's daughter, we will discharge patient back to her skilled nursing facility     Final Clinical Impression(s) / ED Diagnoses Final diagnoses:  None     @PCDICTATION @    , MD 03/04/22 [phone]

## 2022-03-04 NOTE — ED Notes (Signed)
Pt needs 22 g or greater IV for CT  

## 2022-04-11 DEATH — deceased

## 2022-12-27 IMAGING — DX DG CHEST 1V PORT
1 series · 1 of 1 positions shown · non-contrast
Comparison: 02/21/2020

CLINICAL DATA: Altered mental status

EXAM:
PORTABLE CHEST 1 VIEW

[chest ap]
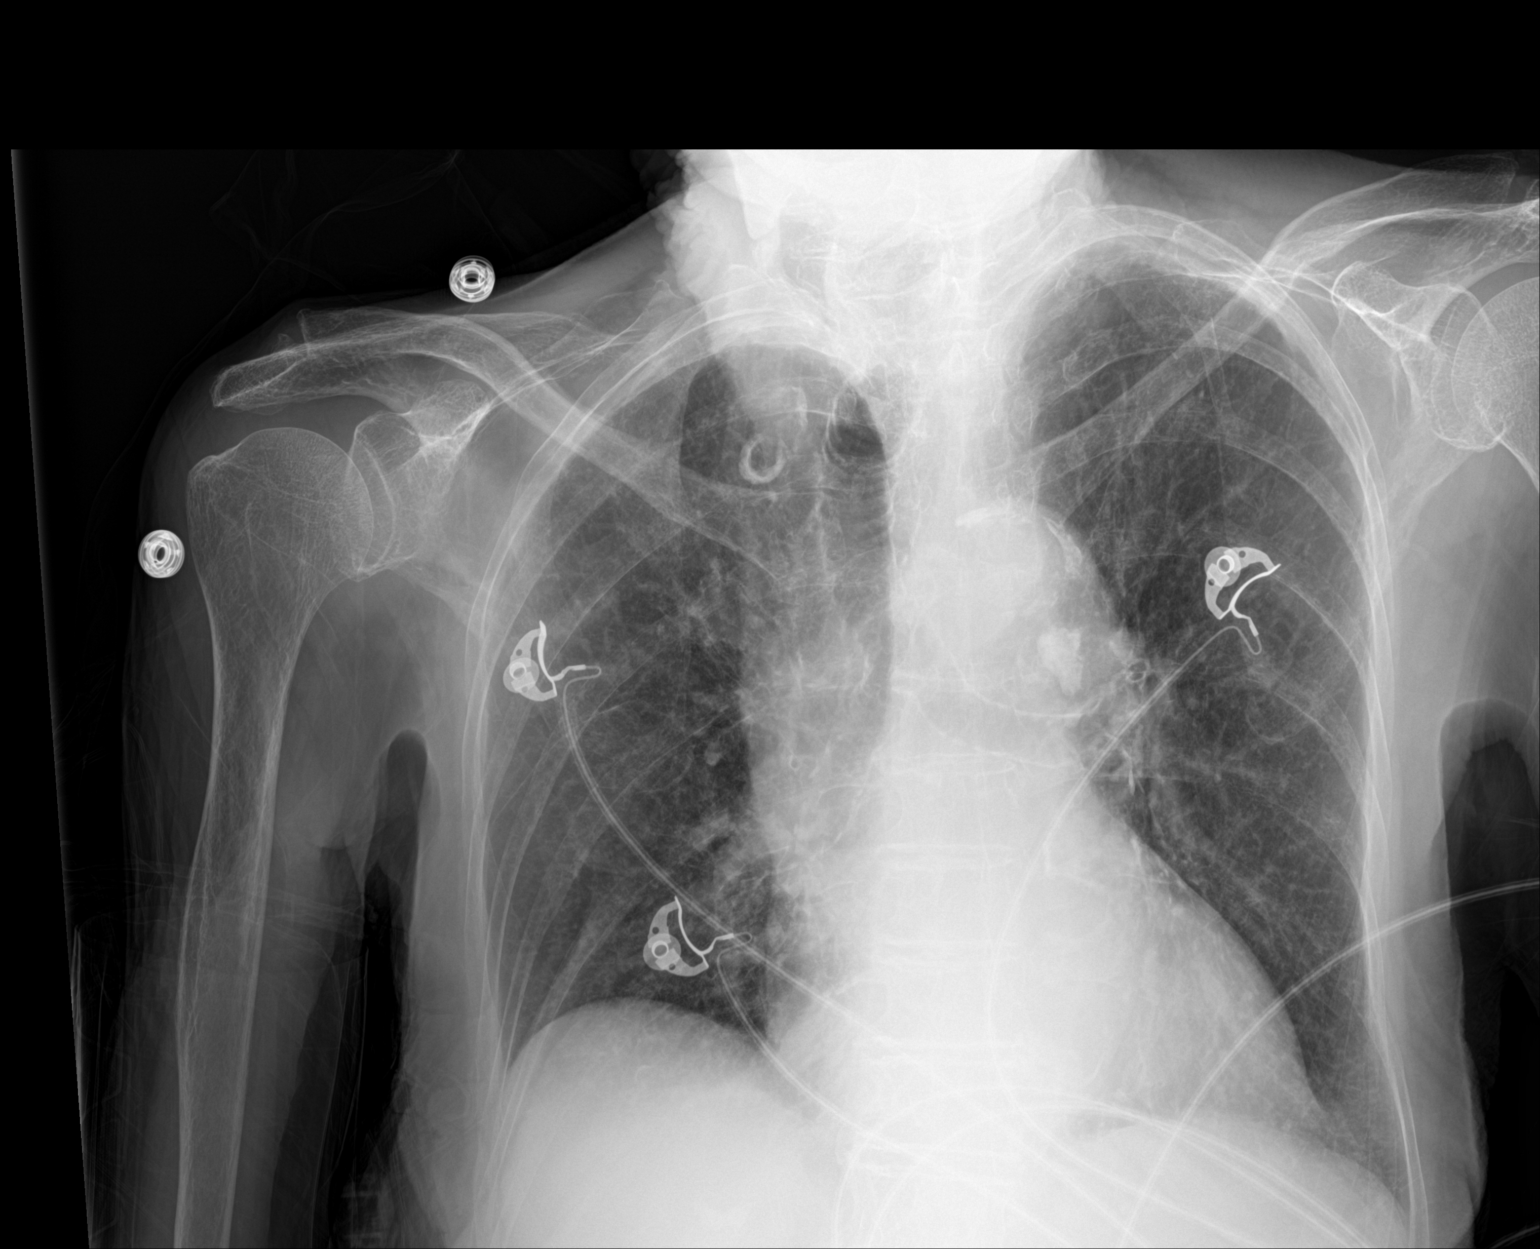

[1 of 1 positions shown; findings below may reference images not displayed]

FINDINGS: Cardiac shadow is stable. Aortic calcifications are noted. Tortuous
thoracic aorta is noted. The lungs are clear bilaterally. No focal
infiltrate or effusion is noted. No bony abnormality is noted.
IMPRESSION: No active disease.
